# Patient Record
Sex: Male | Born: 1955 | Race: White | Hispanic: No | Marital: Single | Smoking: Never smoker
Health system: Southern US, Community
[De-identification: ages and names within clinical notes are randomized; demographics above are authoritative.]

## PROBLEM LIST (undated history)

## (undated) DIAGNOSIS — K279 Peptic ulcer, site unspecified, unspecified as acute or chronic, without hemorrhage or perforation: Secondary | ICD-10-CM

## (undated) DIAGNOSIS — F1010 Alcohol abuse, uncomplicated: Secondary | ICD-10-CM

## (undated) DIAGNOSIS — I5020 Unspecified systolic (congestive) heart failure: Secondary | ICD-10-CM

## (undated) DIAGNOSIS — D509 Iron deficiency anemia, unspecified: Secondary | ICD-10-CM

## (undated) DIAGNOSIS — K449 Diaphragmatic hernia without obstruction or gangrene: Secondary | ICD-10-CM

## (undated) DIAGNOSIS — K922 Gastrointestinal hemorrhage, unspecified: Secondary | ICD-10-CM

## (undated) DIAGNOSIS — N183 Chronic kidney disease, stage 3 unspecified: Secondary | ICD-10-CM

## (undated) DIAGNOSIS — K2970 Gastritis, unspecified, without bleeding: Secondary | ICD-10-CM

## (undated) DIAGNOSIS — I420 Dilated cardiomyopathy: Secondary | ICD-10-CM

## (undated) HISTORY — PX: SHOULDER SURGERY: SHX246

## (undated) HISTORY — PX: ELBOW BURSA SURGERY: SHX615

## (undated) HISTORY — DX: Dilated cardiomyopathy: I42.0

## (undated) HISTORY — DX: Unspecified systolic (congestive) heart failure: I50.20

---

## 2004-09-14 ENCOUNTER — Emergency Department: Payer: Self-pay | Admitting: Emergency Medicine

## 2005-12-10 ENCOUNTER — Emergency Department: Payer: Self-pay | Admitting: Emergency Medicine

## 2006-01-05 ENCOUNTER — Emergency Department: Payer: Self-pay | Admitting: Emergency Medicine

## 2010-08-05 ENCOUNTER — Emergency Department: Payer: Self-pay | Admitting: Unknown Physician Specialty

## 2010-09-25 ENCOUNTER — Emergency Department: Payer: Self-pay | Admitting: Emergency Medicine

## 2010-11-20 ENCOUNTER — Emergency Department: Payer: Self-pay | Admitting: Emergency Medicine

## 2011-12-27 ENCOUNTER — Emergency Department: Payer: Self-pay | Admitting: Emergency Medicine

## 2012-04-28 ENCOUNTER — Inpatient Hospital Stay: Payer: Self-pay | Admitting: Internal Medicine

## 2012-04-28 LAB — DRUG SCREEN, URINE
Amphetamines, Ur Screen: NEGATIVE (ref ?–1000)
Barbiturates, Ur Screen: NEGATIVE (ref ?–200)
Benzodiazepine, Ur Scrn: NEGATIVE (ref ?–200)
Cocaine Metabolite,Ur ~~LOC~~: NEGATIVE (ref ?–300)
MDMA (Ecstasy)Ur Screen: NEGATIVE (ref ?–500)
Methadone, Ur Screen: NEGATIVE (ref ?–300)
Opiate, Ur Screen: NEGATIVE (ref ?–300)
Phencyclidine (PCP) Ur S: NEGATIVE (ref ?–25)
Tricyclic, Ur Screen: NEGATIVE (ref ?–1000)

## 2012-04-28 LAB — URINALYSIS, COMPLETE
Bacteria: NONE SEEN
Bilirubin,UR: NEGATIVE
Glucose,UR: NEGATIVE mg/dL (ref 0–75)
Nitrite: NEGATIVE
Ph: 5 (ref 4.5–8.0)
RBC,UR: 2 /HPF (ref 0–5)
Squamous Epithelial: <1
WBC UR: 11 /HPF (ref 0–5)

## 2012-04-28 LAB — COMPREHENSIVE METABOLIC PANEL
Albumin: 3.4 g/dL (ref 3.4–5.0)
Alkaline Phosphatase: 88 U/L (ref 50–136)
BUN: 45 mg/dL — ABNORMAL HIGH (ref 7–18)
Calcium, Total: 10.1 mg/dL (ref 8.5–10.1)
Chloride: 99 mmol/L (ref 98–107)
Co2: 24 mmol/L (ref 21–32)
Creatinine: 1.84 mg/dL — ABNORMAL HIGH (ref 0.60–1.30)
Osmolality: 286 (ref 275–301)
SGOT(AST): 57 U/L — ABNORMAL HIGH (ref 15–37)
SGPT (ALT): 34 U/L
Sodium: 137 mmol/L (ref 136–145)
Total Protein: 9.6 g/dL — ABNORMAL HIGH (ref 6.4–8.2)

## 2012-04-28 LAB — CK: CK, Total: 1172 U/L — ABNORMAL HIGH (ref 35–232)

## 2012-04-28 LAB — ETHANOL
Ethanol %: <0.003 % (ref 0.000–0.080)
Ethanol: <3 mg/dL

## 2012-04-28 LAB — CBC
HCT: 39.8 % — ABNORMAL LOW (ref 40.0–52.0)
HGB: 13.3 g/dL (ref 13.0–18.0)
MCV: 95 fL (ref 80–100)
RBC: 4.19 10*6/uL — ABNORMAL LOW (ref 4.40–5.90)
RDW: 13 % (ref 11.5–14.5)
WBC: 12.8 10*3/uL — ABNORMAL HIGH (ref 3.8–10.6)

## 2012-04-28 LAB — MAGNESIUM: Magnesium: 2.5 mg/dL — ABNORMAL HIGH

## 2012-04-29 LAB — BASIC METABOLIC PANEL
Anion Gap: 9 (ref 7–16)
BUN: 36 mg/dL — ABNORMAL HIGH (ref 7–18)
Calcium, Total: 8.8 mg/dL (ref 8.5–10.1)
EGFR (African American): 57 — ABNORMAL LOW
EGFR (Non-African Amer.): 49 — ABNORMAL LOW
Osmolality: 292 (ref 275–301)
Potassium: 3.4 mmol/L — ABNORMAL LOW (ref 3.5–5.1)

## 2012-04-29 LAB — CK: CK, Total: 1127 U/L — ABNORMAL HIGH (ref 35–232)

## 2012-04-29 LAB — CK TOTAL AND CKMB (NOT AT ARMC)
CK, Total: 1061 U/L — ABNORMAL HIGH (ref 35–232)
CK-MB: 14.4 ng/mL — ABNORMAL HIGH (ref 0.5–3.6)

## 2012-04-30 LAB — BASIC METABOLIC PANEL
Anion Gap: 9 (ref 7–16)
Calcium, Total: 8.3 mg/dL — ABNORMAL LOW (ref 8.5–10.1)
Chloride: 106 mmol/L (ref 98–107)
Co2: 24 mmol/L (ref 21–32)
Creatinine: 1.01 mg/dL (ref 0.60–1.30)
EGFR (African American): >60
Glucose: 95 mg/dL (ref 65–99)
Osmolality: 279 (ref 275–301)
Sodium: 139 mmol/L (ref 136–145)

## 2012-04-30 LAB — CK TOTAL AND CKMB (NOT AT ARMC): CK-MB: 8.6 ng/mL — ABNORMAL HIGH (ref 0.5–3.6)

## 2012-05-01 LAB — CK: CK, Total: 462 U/L — ABNORMAL HIGH (ref 35–232)

## 2012-05-01 LAB — BASIC METABOLIC PANEL
BUN: 13 mg/dL (ref 7–18)
Co2: 25 mmol/L (ref 21–32)
Creatinine: 0.92 mg/dL (ref 0.60–1.30)
EGFR (African American): >60
EGFR (Non-African Amer.): >60
Glucose: 119 mg/dL — ABNORMAL HIGH (ref 65–99)
Potassium: 4.3 mmol/L (ref 3.5–5.1)
Sodium: 140 mmol/L (ref 136–145)

## 2012-07-30 ENCOUNTER — Emergency Department: Payer: Self-pay | Admitting: Unknown Physician Specialty

## 2012-09-06 ENCOUNTER — Inpatient Hospital Stay: Payer: Self-pay | Admitting: Internal Medicine

## 2012-09-06 LAB — URIC ACID: Uric Acid: 7.3 mg/dL — ABNORMAL HIGH (ref 3.5–7.2)

## 2012-09-06 LAB — CBC WITH DIFFERENTIAL/PLATELET
Basophil %: 0.6 %
Eosinophil %: 0.6 %
HCT: 39.8 % — ABNORMAL LOW (ref 40.0–52.0)
HGB: 13.9 g/dL (ref 13.0–18.0)
Lymphocyte #: 1.3 10*3/uL (ref 1.0–3.6)
MCV: 89 fL (ref 80–100)
Monocyte #: 0.6 x10 3/mm (ref 0.2–1.0)
Monocyte %: 5.2 %
Neutrophil #: 10.4 10*3/uL — ABNORMAL HIGH (ref 1.4–6.5)

## 2012-09-06 LAB — COMPREHENSIVE METABOLIC PANEL
Albumin: 4.4 g/dL (ref 3.4–5.0)
Alkaline Phosphatase: 88 U/L (ref 50–136)
Anion Gap: 5 — ABNORMAL LOW (ref 7–16)
BUN: 21 mg/dL — ABNORMAL HIGH (ref 7–18)
Bilirubin,Total: 0.3 mg/dL (ref 0.2–1.0)
Creatinine: 1.32 mg/dL — ABNORMAL HIGH (ref 0.60–1.30)
Glucose: 90 mg/dL (ref 65–99)
Osmolality: 278 (ref 275–301)
Potassium: 4.1 mmol/L (ref 3.5–5.1)
Sodium: 138 mmol/L (ref 136–145)
Total Protein: 9 g/dL — ABNORMAL HIGH (ref 6.4–8.2)

## 2012-09-07 LAB — BASIC METABOLIC PANEL
Anion Gap: 6 — ABNORMAL LOW (ref 7–16)
BUN: 17 mg/dL (ref 7–18)
Chloride: 107 mmol/L (ref 98–107)
Co2: 24 mmol/L (ref 21–32)
Creatinine: 1.29 mg/dL (ref 0.60–1.30)
EGFR (African American): >60
Osmolality: 275 (ref 275–301)

## 2012-09-07 LAB — CBC WITH DIFFERENTIAL/PLATELET
Basophil %: 0.2 %
Eosinophil #: 0.2 10*3/uL (ref 0.0–0.7)
Eosinophil %: 1.8 %
HGB: 11.9 g/dL — ABNORMAL LOW (ref 13.0–18.0)
Lymphocyte %: 14.6 %
MCH: 31.9 pg (ref 26.0–34.0)
MCHC: 35.7 g/dL (ref 32.0–36.0)
Monocyte #: 0.6 x10 3/mm (ref 0.2–1.0)
Monocyte %: 6.7 %
Neutrophil %: 76.7 %
Platelet: 159 10*3/uL (ref 150–440)
RBC: 3.74 10*6/uL — ABNORMAL LOW (ref 4.40–5.90)
RDW: 14.1 % (ref 11.5–14.5)
WBC: 9.4 10*3/uL (ref 3.8–10.6)

## 2012-09-08 LAB — CBC WITH DIFFERENTIAL/PLATELET
Basophil %: 0.2 %
Eosinophil #: 0.1 10*3/uL (ref 0.0–0.7)
Eosinophil %: 1.3 %
HCT: 35.3 % — ABNORMAL LOW (ref 40.0–52.0)
HGB: 12.8 g/dL — ABNORMAL LOW (ref 13.0–18.0)
Lymphocyte #: 1.3 10*3/uL (ref 1.0–3.6)
MCH: 32.3 pg (ref 26.0–34.0)
MCHC: 36.2 g/dL — ABNORMAL HIGH (ref 32.0–36.0)
MCV: 89 fL (ref 80–100)
Monocyte #: 0.7 x10 3/mm (ref 0.2–1.0)
Neutrophil #: 8.3 10*3/uL — ABNORMAL HIGH (ref 1.4–6.5)
Neutrophil %: 79.9 %
RBC: 3.96 10*6/uL — ABNORMAL LOW (ref 4.40–5.90)
RDW: 14.3 % (ref 11.5–14.5)

## 2012-09-09 ENCOUNTER — Ambulatory Visit: Payer: Self-pay | Admitting: Orthopedic Surgery

## 2012-09-09 LAB — CBC WITH DIFFERENTIAL/PLATELET
Basophil #: 0 10*3/uL (ref 0.0–0.1)
Basophil %: 0.1 %
Eosinophil #: 0 10*3/uL (ref 0.0–0.7)
HCT: 34.1 % — ABNORMAL LOW (ref 40.0–52.0)
Lymphocyte #: 0.9 10*3/uL — ABNORMAL LOW (ref 1.0–3.6)
MCH: 31 pg (ref 26.0–34.0)
MCHC: 35.4 g/dL (ref 32.0–36.0)
MCV: 88 fL (ref 80–100)
Monocyte #: 0.9 x10 3/mm (ref 0.2–1.0)
Monocyte %: 6.4 %
Neutrophil #: 11.6 10*3/uL — ABNORMAL HIGH (ref 1.4–6.5)
Platelet: 158 10*3/uL (ref 150–440)
RDW: 14.5 % (ref 11.5–14.5)
WBC: 13.5 10*3/uL — ABNORMAL HIGH (ref 3.8–10.6)

## 2012-09-09 LAB — SYNOVIAL CELL COUNT + DIFF, W/ CRYSTALS
Basophil: 0 %
Other Mononuclear Cells: 0 %

## 2012-09-10 LAB — CBC WITH DIFFERENTIAL/PLATELET
Basophil #: 0 10*3/uL (ref 0.0–0.1)
Basophil %: 0.3 %
Eosinophil #: 0 10*3/uL (ref 0.0–0.7)
HCT: 30.4 % — ABNORMAL LOW (ref 40.0–52.0)
HGB: 10.8 g/dL — ABNORMAL LOW (ref 13.0–18.0)
Lymphocyte #: 0.7 10*3/uL — ABNORMAL LOW (ref 1.0–3.6)
Lymphocyte %: 4.2 %
MCHC: 35.4 g/dL (ref 32.0–36.0)
MCV: 89 fL (ref 80–100)
Neutrophil #: 14.9 10*3/uL — ABNORMAL HIGH (ref 1.4–6.5)
Platelet: 144 10*3/uL — ABNORMAL LOW (ref 150–440)
RDW: 14.1 % (ref 11.5–14.5)

## 2012-09-10 LAB — VANCOMYCIN, TROUGH
Vancomycin, Trough: 22 ug/mL (ref 10–20)
Vancomycin, Trough: 15 ug/mL (ref 10–20)

## 2012-09-11 LAB — CBC WITH DIFFERENTIAL/PLATELET
Basophil #: 0 10*3/uL (ref 0.0–0.1)
Basophil %: 0.3 %
Eosinophil #: 0.1 10*3/uL (ref 0.0–0.7)
HCT: 31.9 % — ABNORMAL LOW (ref 40.0–52.0)
HGB: 11 g/dL — ABNORMAL LOW (ref 13.0–18.0)
MCV: 89 fL (ref 80–100)
Monocyte #: 0.9 x10 3/mm (ref 0.2–1.0)
Neutrophil #: 11.3 10*3/uL — ABNORMAL HIGH (ref 1.4–6.5)
Neutrophil %: 85.7 %
RBC: 3.58 10*6/uL — ABNORMAL LOW (ref 4.40–5.90)
RDW: 14.5 % (ref 11.5–14.5)
WBC: 13.2 10*3/uL — ABNORMAL HIGH (ref 3.8–10.6)

## 2012-09-12 LAB — CULTURE, BLOOD (SINGLE)

## 2012-09-13 LAB — WOUND CULTURE

## 2012-09-30 LAB — CULTURE, FUNGUS WITHOUT SMEAR

## 2015-01-30 NOTE — Consult Note (Signed)
PATIENT NAME:  John Escobar, John Escobar MR#:  [id number] DATE OF BIRTH:  1956-06-07  DATE OF CONSULTATION:  09/11/2012  REFERRING PHYSICIAN:  Cherre Huger, MD   CONSULTING PHYSICIAN:  Cheral Marker. Ola Spurr, MD  REASON FOR CONSULTATION: Evaluation of left elbow and wrist septic arthritis versus gout.   HISTORY OF PRESENT ILLNESS: This is a pleasant 59 year old gentleman with a history of hypertension and recurrent arthritis who was admitted on November 25th with complaints of several weeks of increasing left elbow pain. He had had no recent abscesses, infections, injuries or abrasions in the area or elsewhere. He says he has been relatively well over the last few weeks except for the elbow pain. He had been taking pain medications for relief. At the time of admission, he was noted to have a swollen erythematous elbow and was started on IV antibiotics. Orthopedics evaluated him and recommended MRI of the elbow. While on IV antibiotics, the patient then developed left wrist pain. He was taken to the Operating Room by Orthopedics on  November 28th and had washout of his left wrist and elbow. Some minimal purulence was found in some fluid collections but no overwhelming evidence of infection. Gouty crystals were found on the fluid in the wrist.   Currently, the patient says he still has left arm and wrist pain. His right elbow is also starting to act up. He does not feel feverish, short of breath  or have chest pain or other spots that are flaring. He did have a temperature of 103 postoperative yesterday.   PAST MEDICAL HISTORY:  1. Hypertension, not currently on medication.  2. Arthritis. The patient says he has had multiple joints involved including his toes and his knees as well as his hands. He said he has been checked for gout by having his uric acid drawn and told it was negative. He has never had aspiration or injection of his joints.   SOCIAL HISTORY: The patient is divorced. He lives in a homeless  shelter. He is currently unemployed.   FAMILY HISTORY: Noncontributory.   REVIEW OF SYSTEMS: Eleven systems are reviewed and negative except as per history of present illness.  CURRENT MEDICATIONS: The patient is on Vancomycin and Zosyn. He is on amlodipine for blood pressure as well as clonidine. He is on morphine for pain.   ALLERGIES: The patient is aware of no known drug allergies.   PHYSICAL EXAMINATION:  The patient's T-max was 103 on November 29th. Currently he is 98.4, pulse 92, blood pressure 113/70, pulse oximetry 94%, saturating well on room air.   GENERAL: He is pleasant, interactive, wearing dark glasses in the room. He has a slightly unusual affect.   HEENT: Pupils are equal, round, reactive to light and accommodation. Extraocular movements are intact. Sclerae are anicteric. Oropharynx is clear.   NECK: Supple.   HEART: Regular.   LUNGS: Clear to auscultation bilaterally.   ABDOMEN: Soft, nontender, nondistended. No hepatosplenomegaly.   EXTREMITIES: His left elbow and wrist are both wrapped. There is some edema and limited range of motion. His right elbow has some effusion and tenderness and limited range of motion.   LABORATORY, DIAGNOSTIC AND RADIOLOGICAL DATA: White blood count on admission 12.5 with 10.4 thousand neutrophils. His white blood count increased to a max of 16.7 on November 29th and is now down to 13.2. Hemoglobin is 11.8, platelets 155. Comprehensive panel done on November 26th revealed a BUN of 17, creatinine 1.29, otherwise normal. Uric acid was elevated at 7.3. He had  normal LFTs except an elevated total protein at 9.0. Fluid drawn on the 28th from the left wrist was unable to have a wound fluid count done on it for some reason. However, there were monosodium urate crystals noted. Cultures from the wrists and the elbow are all negative. Blood cultures from November 25th are negative x2. CRP on the day of admission was 8.7. ESR was 27. MRI of the left elbow  shows improvement in findings of the elbow from previous study of November 25th. MRI of the left wrist showed a loculated fluid collection or abscess deep to the second extensor compartment and evidence of possible septic arthritis.   IMPRESSION: A 59 year old homeless gentleman with history of hypertension as well as polyarthritis of unclear etiology, admitted with left elbow swelling and pain and then developed left wrist swelling and pain. Cultures from his surgery are negative; however, he does have monosodium urate crystals. He, however, was on antibiotics so cultures are not very reliable. He does have a history of what really sounds like gout although he has never been diagnosed with it.   I think this is mostly a gouty flare even with the fevers and the elevated white count. His right elbow is now involved, and he has swelling and limited range of motion there. There is probably an element of infection although we cannot rule this in or out. I do not think he has a serious septic arthritis infection. It is more likely a deep tissue infection.   RECOMMENDATIONS:  1. I would start treatment for gout with indomethacin and colchicine.   2. Continue vancomycin and Zosyn until discharge. At that time, we would switch him to antibiotics orally. I would recommend doxycycline 100 twice a day and ciprofloxacin 500 twice a day for a 14-day course.  3. The patient will need outpatient followup for presumed gout and to ensure that his symptoms do not recur.   Thank you very much for the consult. I will be glad to follow along with you.  ____________________________ Cheral Marker. Ola Spurr, MD dpf:cbb D: 09/11/2012 12:19:35 ET T: 09/11/2012 13:02:37 ET JOB#: [id number]  cc: Cheral Marker. Ola Spurr, MD, <Dictator> DAVID Ola Spurr MD ELECTRONICALLY SIGNED 09/14/2012 21:45

## 2015-01-30 NOTE — Discharge Summary (Signed)
PATIENT NAME:  John Escobar, John Escobar MR#:  [id number] DATE OF BIRTH:  07/10/1956  DATE OF ADMISSION:  09/06/2012 DATE OF DISCHARGE:  09/13/2012  DISCHARGE DIAGNOSES: 1. Left elbow and wrist cellulitis. 2. Olecranon bursitis. 3. Localized abscess of left anconeus muscle. 4. 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  5. Hypertension.   DISPOSITION: The patient is being discharged home.   DISCHARGE FOLLOWUP: Follow up with the [facility] and Dr. Murlean HarkShalini Ramasunder, [facility], within one week of discharge.   DIET: Low sodium.   ACTIVITY: As tolerated.  DRESSING CARE: Apply dry dressing and change daily.   DISCHARGE MEDICATIONS:  1. Indomethacin 50 mg three times daily, stop after two days.  2. Vicodin 5/325 mg one tablet every 4 hours p.r.n.  3. Colchicine 0.6 mg once a day. 4. Lopressor 25 mg twice a day. 5. Cipro 500 mg twice a day for 14 days.  6. Doxycycline 100 mg twice a day for 14 days.   CONSULTANTS:  1. Clydie Braunavid Fitzgerald, MD - Infectious Disease. 2. Murlean HarkShalini Ramasunder, MD - Orthopedics. 3. Myra Rudehristopher Smith, MD - Orthopedics.  RESULTS: Left elbow x-ray: Diffuse soft tissue swelling, olecranon spur. No evidence of fracture.   MRI of left elbow: Fluid collection with peripheral enhancement over the olecranon process most consistent with bursitis, reactive versus infectious. Surrounding soft tissue edema with enhancement concerning for cellulitis. Abnormal edema within anconeus muscle with enhancement which may be reactive, myositis versus infective myositis or tiny abscess. No significant joint effusion to suggest septic arthritis.   MRI of the left forearm and hand: Possible septic arthritis, loculated fluid collection concerning for abscess of phlegmon.  MRI of forearm: No evidence of any fluid tracking along the fascial planes, in the muscular or soft tissue.  CRP was elevated.   Blood cultures have shown no growth so far.    Wound culture from the wrist and elbow have shown no growth so far.  Fluid analysis from the wrist showed presence of monosodium urate crystals.   White count 12.5 to 16.7, hemoglobin 13.9 to 11, and normal platelet count. Creatinine 1.2 on admission and normal by the time of discharge. Uric acid 7.3, elevated.   HOSPITAL COURSE: The patient is a 59 year old male who presented with left elbow swelling. Initial x-ray showed evidence of cellulitis. He underwent a MRI which showed possible olecranon bursitis and abscess and infection of the left anconeus muscle. The patient was empirically treated with antibiotics. He subsequently also developed swelling in his left wrist and there was concern for septic arthritis on a MRI that was done subsequently. An orthopedic consultation was obtained and the patient underwent incision and drainage of the left wrist and the left elbow. Blood and wound cultures have showed no growth so far. Fluid analysis of the synovial fluid from the left wrist showed evidence of monosodium urate crystals. His uric acid was also elevated. On further questioning, the patient reported that he in the past had swelling in other joints including those of his foot. An Infectious Disease consultation was obtained with Dr. Sampson GoonFitzgerald while in the hospital. The patient was treated with vancomycin and Zosyn. Dr. Sampson GoonFitzgerald recommended switching the patient to Cipro and doxycycline for completion of outpatient treatment. He was also started on treatment for gout with indomethacin and colchicine with good control of his symptoms. The patient had a history of hypertension, but was not on any medications. His blood pressure was high, therefore, he had to be started on medications  while in the hospital. The left elbow and left wrist wounds are doing well. They are sutured with no evidence of drainage or redness/infection. The patient needs to follow-up with [facility] orthopedic physician, Dr.  Murlean Hark, within one week for reevaluation and removal of sutures. The patient was afebrile and hemodynamically stable by the time of discharge.   TIME SPENT: 45 minutes. ____________________________ Darrick Meigs, MD sp:slb D: 09/13/2012 14:30:22 ET T: 09/13/2012 15:02:11 ET JOB#: [id number]  cc: Darrick Meigs, MD, <Dictator> [facility] Darrick Meigs MD ELECTRONICALLY SIGNED 09/14/2012 12:11

## 2015-01-30 NOTE — Consult Note (Signed)
Brief Consult Note: Diagnosis: Left elbow pain.   Comments: Began on weekend after he switched from a walker to a cane.  Presents to ED for worsening.  Denies fever.  Denies injury that he recalls.  Notes history of severe joint pain both knee, left shoulder and multiple feet stress fractures.  States that he has been admitted to hospital for pain in other joints.  Notes redness of elbow that began yesterday.  PE: Soft tissue swelling lateral elbow and some bogginess in olecranon bursa.  Mild erythema about elbow.  ROM passive 30-90.  Exquisite TTP over lateral epicondyle.  A/P: Left elbow pain, mild erythema, mildly elevated WBC and ESR.  CRP pending.  MRI to rule out septic arthritis. Cannot aspirate through cellulitic area.  No effusion seen on radiographs - confirmed with radiology.   Will follow MRI and plan accordingly - process in lateral epicondyle?  Antibiotcs started in ED.  Electronic Signatures: Dawayne Patricia (MD)  (Signed [phone] 17:04)  Authored: Brief Consult Note   Last Updated: 25-Nov-13 17:04 by Dawayne Patricia (MD)

## 2015-01-30 NOTE — Consult Note (Signed)
Brief Consult Note: Diagnosis: Gouty arthritis with probable deep tissue infection but doubt septic arthritis.   Patient was seen by consultant.   Consult note dictated.   Recommend further assessment or treatment.   Orders entered.   Discussed with Attending MD.   Comments: Rec  start gout treatment - dc allopurinol in setting of acute flare - cont vanco and zosyn  - if cxs neg then at dc rec doxy 100 bid and cipro 500 bid for 14 day course will follow - thank you for the consult.  Electronic Signatures: Dierdre HarnessFitzgerald, Shawnette Augello Patrick (MD)  (Signed [phone]-Nov-13 12:22)  Authored: Brief Consult Note   Last Updated: 30-Nov-13 12:22 by Dierdre HarnessFitzgerald, Michael Walrath Patrick (MD)

## 2015-01-30 NOTE — Discharge Summary (Signed)
PATIENT NAME:  John Escobar MR#:  [id number] DATE OF BIRTH:  02/05/1956  DATE OF ADMISSION:  04/28/2012 DATE OF DISCHARGE:  05/01/2012  PRIMARY CARE PHPilar JarvisYSICIAN: No CONSULTING PHYSICIAN: Dr. Hyacinth MeekerMiller, orthopedic surgery.   DISCHARGE DIAGNOSES:  1. Acute renal failure.  2. Rhabdomyolysis. 3. Osteoarthritis.   PROCEDURES: None.   CONDITION: Stable.   CODE STATUS: FULL CODE.    HOME MEDICATIONS:  1. Prednisone 40 mg p.o. daily, then taper.  2. Tramadol 50 mg p.o. every six hours p.r.n. for pain.   DIET: Regular diet.   ACTIVITY: As tolerated.   FOLLOW-UP CARE: Follow up PCP within one week. Follow up with Dr. Hyacinth MeekerMiller within one week   HISTORY OF PRESENT ILLNESS: Patient is a 59 year old Caucasian male with history of osteoarthritis was found by policeman in the streets lying down, unable to ambulate, dehydrated and did not eat for several days, complaining of a flare-up of her arthritis. For detailed history and physical examination, please refer to the admission note dictated by Dr. Rudene Escobar on admission.   LABORATORY, DIAGNOSTIC, AND RADIOLOGICAL DATA: Glucose 118, BUN 45, creatinine 1.8, sodium 137, potassium 3.9, magnesium 2.5. Alcohol level 0.003. CK 1172. Urine drug screen was negative. White count 12,000, hemoglobin 13.  HOSPITAL COURSE: Patient was admitted for acute renal failure likely secondary to a combination of prerenal azotemia from dehydration and rhabdomyolysis. After admission patient has been treated with IV fluid support. Renal function improved to normal range. In addition rhabdomyolysis has been improving. CK decreased to about 450. Patient complains of bilateral knee pain possibly due to osteoarthritis. X-ray show also osteoarthritis. No fracture or dislocation. Dr. Hyacinth MeekerMiller evaluated patient, recommended follow up as outpatient. [facility]. He also suggested taken prednisone then taper. Patient is clinically stable. He will be  discharged to a homeless shelter today. Discussed the patient's discharge plan with the patient and nurse and the case manager.   TIME SPENT: About 35 minutes.  ____________________________ Shaune PollackQing Naelle Diegel, MD qc:cms D: 05/01/2012 14:51:41 ET T: 05/03/2012 11:01:41 ET JOB#: [id number]  cc: Shaune PollackQing Kysha Muralles, MD, <Dictator>  Shaune PollackQING Stevens Magwood MD ELECTRONICALLY SIGNED 05/03/2012 16:12

## 2015-01-30 NOTE — Consult Note (Signed)
Brief Consult Note: Diagnosis: Bilateral knee arthritis.   Patient was seen by consultant.   Recommend further assessment or treatment.   Comments: 59 year old homeless male was admitted for renal failure after being found on street by police.  Has had history of knee arthritis and was last seen by his rheumatologist, Dr  Kathi LudwigSyed, in May.  complains of bilateral knee pain but is better with prednisone po. Being discharged to homeless shelter today.   Exam:  alert and comfortable.  circulation/sensation/motor function good both knees.  slight effusion both knees.  no reddness or warmth.  range of motion 0-95* bilaterally with mild pain.  stable to stress.  skin intact.  ligaments stable.    X-rays:  bilateral medial joint line arthritis, right > left.    Imp:  Bilateral knee arthritis  Rx:  continue prednisone taper        follow-up with Dr Kathi LudwigSyed this week.        He is not a suitable surgical candidate in his current living situation.  [facility] nee  [facility]..  Electronic Signatures: Valinda HoarMiller, Nechemia Chiappetta E (MD)  (Signed 20-Jul-13 12:52)  Authored: Brief Consult Note   Last Updated: 20-Jul-13 12:52 by Valinda HoarMiller, Solash Tullo E (MD)

## 2015-01-30 NOTE — Consult Note (Signed)
PATIENT NAME:  John Escobar, John Escobar MR#:  [id number] DATE OF BIRTH:  1956/01/12  DATE OF CONSULTATION:  09/06/2012  REFERRING PHYSICIAN:   CONSULTING PHYSICIAN:  Murlean HarkShalini Cora Stetson, MD  A brief consult note was typed at the time of consultation.   CHIEF COMPLAINT: Left elbow pain and swelling.   HISTORY: Mr. John Escobar is a 59 year old gentleman who presented to the Emergency Room with complaints of swelling and pain in the left elbow. He states that he had had no injury. He does note that over the past one week, just prior to the onset of pain, he switched from using a walker to using a cane held in the left hand. He states that he took 200 mg of ibuprofen with no pain relief.   PHYSICAL EXAMINATION: On presentation to the emergency room, he had erythema surrounding the elbow and significant pain. Radiographs and an MRI were obtained. On physical Examination, the left elbow was examined. Over the posterior aspect of the elbow. There is some erythema of moderate intensity. There is significant soft tissue swelling and bogginess of the olecranon bursa. The olecranon bursa is moderately tender to palpation. The patient has no tenderness to palpation over the medial epicondyle or in the ulnar groove. There is no tenderness to palpation posterior to the lateral epicondyle. The patient is exquisitely tender to palpation over the lateral epicondyle. He has pain with forced volar flexion of the wrist and with resisted dorsiflexion of the wrist. He has no palpable epitrochlear lymphadenopathy. Range of motion of the elbow is from 30 to 90 degrees passively. Imaging studies on, AP, lateral, and two oblique views of the left elbow were obtained. They demonstrate significant soft tissue swelling. They is no obvious effusion seen on the radiographs. MRI was ordered and results are pending.   ASSESSMENT/RECOMMENDATIONS: Left elbow pain with mild to moderate erythema. Septic arthritis is unlikely based on radiographs,  however, we will confirm this with MRI. The patient will be admitted to the medical service for IV antibiotic treatment. If MRI demonstrates prominent effusion in the joint consistent with possible septic arthritis, we will proceed with operative irrigation and debridement. I do not suspect this will be the case.      The patient should be placed in a sling. He should have ice applied to his elbow for a minimum of 20 minutes, three to four times per day. Physical therapy should be consulted to proceed with gentle range of motion.   We will follow up after the MRI has been completed.  ____________________________ Murlean HarkShalini Mersadez Linden, MD sr:slb D: 09/07/2012 14:35:00 ET T: 09/07/2012 15:21:18 ET JOB#: [id number]  cc: Murlean HarkShalini Dagmar Adcox, MD, <Dictator> Murlean HarkSHALINI Arnaldo Heffron MD ELECTRONICALLY SIGNED 09/29/2012 10:23

## 2015-01-30 NOTE — H&P (Signed)
PATIENT NAME:  John Escobar, John Escobar MR#:  [id number] DATE OF BIRTH:  [birth year]  DATE OF ADMISSION:  04/29/2012  PRIMARY CARE PHYSICIAN: He does not recall a name, but last time he went to [facility].   CHIEF COMPLAINT:  He was found by the police in the street, lying down, unable to ambulate, dehydrated, and had not eaten for several days. He is also complaining of a flare-up of  arthritis.   HISTORY OF PRESENT ILLNESS: Mr. Shipper is a 59 year old Caucasian male who is right now homeless. He states that the last meal he had was Thursday, that is about a week ago. He had some doughnuts. Since that time he hydrated himself with some Gatorade but he ran out of that as well. He got weaker with time and he had a flare-up of arthritis. The patient is known to have osteoarthritis. The joint pain started with both feet, then both knees, and now even his wrists on both sides are painful without actual swelling. This has been ongoing for about a week. The patient tried to sleep in the street and he was found by the police.  They tried to get him off the ground but could not, but with the help of three people they were able to get him to the car and he was transported to the Emergency Department. Evaluation here was consistent with rhabdomyolysis, acute renal failure, and dehydration. The patient was admitted for further management.   REVIEW OF SYSTEMS: CONSTITUTIONAL: Denies having any fever. No chills, but he has fatigue. EYES: No blurring of vision. No double vision. ENT: No hearing impairment. No sore throat. No dysphagia. CARDIOVASCULAR: No chest pain. No shortness of breath. No edema.  RESPIRATORY: No cough. No shortness of breath. No chest pain. GI: No abdominal pain, no vomiting, no diarrhea. GU: Denies any dysuria. No frequency of urination. MUSCULOSKELETAL: He has joint pains all over without swelling. No muscular pain or swelling other than generalized weakness. INTEGUMENT: No skin rash. No  ulcers. NEURO: No focal weakness. No seizure activity. No headache. PSYCH: No anxiety. No depression.  ENDOCRINE: No polyuria or polydipsia. No heat or cold intolerance.   PAST MEDICAL HISTORY:  Osteoarthritis, in particular both of his knees. Last time he was seen by [facility] he was referred to a rheumatologist in their group.  That was on 05/09, two months ago.  He was told that he has generalized osteoarthritis.   PAST SURGICAL HISTORY:  1. Bilateral knee joint arthroscopic surgeries. He was told that the cartilage of the left knee is completely gone.  2. He also had left shoulder surgery for bursitis.   FAMILY HISTORY: His father is alive at the age of 38. He had several heart attacks. His mother is still alive. She is 68, suffers from dementia and resides at a nursing home.   SOCIAL HISTORY: He is married, recently separated from his wife. He is now homeless. 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   SOCIAL HABITS: Nonsmoker. No history of alcohol or drug abuse.   ADMISSION MEDICATIONS: None.   ALLERGIES: No known drug allergies.   PHYSICAL EXAMINATION:  VITAL SIGNS: Blood pressure 110/74, respiratory rate 20, pulse 125 and regular, temperature 97.9, oxygen saturation 97%.   GENERAL APPEARANCE: Middle-aged male laying in bed in no acute distress.   HEAD: No pallor. No icterus. No cyanosis.   ENT: Hearing was normal. Nasal mucosa, lips, tongue were normal.   EYES: Normal  eyelids and conjunctivae, pupils about 4 mm and sluggishly reactive to light.   NECK: Supple. Trachea at midline. No thyromegaly. No cervical lymphadenopathy. No masses.   HEART: Normal S1, S2. No S3, S4. No murmur. No gallop. No carotid bruits.   RESPIRATORY: Normal breathing pattern without use of accessory muscles. No rales. No wheezing.   ABDOMEN: Soft without tenderness. No hepatosplenomegaly. No masses. No hernias.   SKIN: No ulcers. No subcutaneous nodules.    MUSCULOSKELETAL: No joint swelling. No clubbing. No effusions.   NEUROLOGIC: Cranial nerves II through XII are intact. No focal motor deficit.   PSYCHIATRY: The patient is alert and oriented times three. Mood and affect were normal.   LABORATORY, DIAGNOSTIC, AND RADIOLOGICAL DATA: Serum glucose 118, BUN 45, creatinine 1.8, sodium 137, potassium 3.9, magnesium 2.5. Ethanol was less than 0.003. Total protein 9.6. Albumin 3.4, bilirubin 1.2, AST 57, ALT 34. Total CPK 1172. Urine drug screen was negative Urinalysis showed hazy urine, 11 white blood cells. CBC showed white count of 12,000, hemoglobin 13, hematocrit 39, platelet count 249.   ASSESSMENT:  1. Acute renal failure: Likely secondary to a combination of  prerenal azotemia from dehydration along with rhabdomyolysis. 2. Rhabdomyolysis: Secondary to inactivity and lying down on the ground for quite some time.  3. Dehydration.  4. Mild leukocytosis: This is nonspecific. 5. Pyuria: Mild elevation of white blood cells in the urine. The patient is asymptomatic. 6. Generalized osteoarthritis.   PLAN:  We will admit the patient to the medical floor. IV hydration with normal saline. Follow up on BUN, creatinine, and electrolytes. Follow up on the total CPK. Pain control using either Tylenol or Ultram. We will avoid nonsteroidal anti-inflammatory medication since he has renal compromise for the time being. I will send urine for culture; since the patient is asymptomatic, I will not treat. However, if the culture grows out significant bacteria then we will treat if it is evident.  TIME SPENT EVALUATING THIS PATIENT: More than 55 minutes.    ____________________________ Carney CornersAmir M. Rudene Rearwish, MD amd:bjt D: 04/28/2012 23:29:53 ET T: 04/29/2012 07:06:58 ET JOB#: [id number]  cc: [facility], [facility] Zollie ScaleAMIR M Shafiq Larch MD ELECTRONICALLY SIGNED 04/30/2012 4:57

## 2015-01-30 NOTE — Op Note (Signed)
PATIENT NAME:  John Escobar, John Escobar MR#:  [id number] DATE OF BIRTH:  1956-03-20  DATE OF PROCEDURE:  09/09/2012  PREOPERATIVE DIAGNOSIS:  Left wrist septic arthritis, left elbow infected olecranon bursitis with localized abscess of left anconeus muscle.  POSTOPERATIVE DIAGNOSIS: Left wrist septic arthritis, left elbow infected olecranon bursitis with localized abscess of left anconeus muscle.  PROCEDURES:  1. Irrigation and excision of debridement down to and including left wrist joint capsule of left wrist with wound measurements of 8 x 2 cm.     2. Irrigation and excision of debridement down to and including muscle of left elbow with wound measurements of 6 x 2 cm.   SURGEON:  [place] E. Hue Steveson, MD.   ANESTHESIA: General.    FLUIDS: Per anesthesia record.  BLOOD LOSS: Minimal.  COMPLICATIONS: None.  SPECIMENS: Intraoperative culture swabs and tissue samples were obtained and sent to lab for evaluation.  INDICATIONS: This is a 59 year old male who was admitted initially with cellulitis to the left elbow and evidence of left elbow infected olecranon bursitis. He underwent a magnetic resonance imaging which did in fact reveal some bursitis as well as a localized abscess within the anconeus muscle. He was treated with IV antibiotics and responded appropriately initially. However, he did, on the morning of surgery have increasing pain not only within the left elbow but more pronounced within the left wrist. A magnetic resonance imaging of the left upper extremity was completed which revealed fluid along the second dorsal compartment which then tracked down into the region of the wrist joint. It was felt that the patient had failed conservative medical management and was therefore indicated for operative intervention in the form of a formal incision and drainage of both the left wrist and elbow. All risks, benefits and alternatives of the procedure were explained at length to the patient and he wished to  proceed. Informed consent was obtained.   DESCRIPTION OF PROCEDURE:  The patient was seen in the preoperative holding area where the operative site was marked by an operative surgeon. Preoperative antibiotics were administered routinely on the floor. The patient was taken to the Operating Room where he was placed supine on the Operating Room table. All bony prominences were well padded. General anesthesia was gently induced. The left upper extremity was isolated. A tourniquet was placed about the left upper arm. The left upper extremity was then prepped and draped in the usual sterile manner and a surgical time-out was performed. To begin we made an incision at Lister's tubercle at the joint-line and made our way down to the extensor tendons. We retracted the interval between the second toe compartment and made our way to the joint capsule.  There was some edematous fluid superficially which was swabbed and sent to lab for evaluation. Upon entry into the joint itself, we encountered additional fluid with minimal purulence. There was some crystallized material within the joint as well which was collected and sent to lab for evaluation. A portion of the joint capsule was also debrided and excised sharply and sent to lab for evaluation. A small curette was then used to debris the soft tissues of any nonviable tissue. We then opened the sheaths of the second and third compartments which again revealed some edematous fluid tracking along the distal aspect of the compartments but no purulence was noted headed in a proximal direction. At this point, the wrist was copiously irrigated with normal saline using bulb syringe. The capsule was closed with 3-0 Vicryl. We then  turned our attention to the left elbow where an incision was made between the lateral epicondyle and the tip of the olecranon. We dissected medially first making our way over to the olecranon bursa. This was incised revealing a small amount of fluid. A  curette was utilized to debris the bursa and excise all nonviable tissue. We then made our way laterally up towards the muscle belly of the anconeus muscle. There was no purulence or fluid encountered. We did bluntly dissect along this interval and gently debrided the tissues, excising any nonviable tissue in its entirety. At this point, the left elbow was copiously irrigated with normal saline using bulb syringe. The skin was closed with 3-0 Vicryl followed by 4-0 nylon. The margins of the left wrist wound were also closed partially. A wet to dry dressing was placed within the mid portion of the incision. Sterile dressings were applied to both the left wrist and left elbow. Drapes were removed. The patient was awakened from general anesthesia without complication and transported to the Recovery Room in stable condition.    POSTOPERATIVE PLAN: The patient will return to the floor. He will continue with his antibiotics. An ID consult has already been placed and we will await their findings. We will followup on all intraoperative culture results.   ____________________________ [place] E. Genasis Zingale, MD ces:ap D: 09/10/2012 09:16:31 ET                T: 09/10/2012 09:29:14 ET              JOB#: [id number] ItalyHAD E Estanislado Surgeon MD ELECTRONICALLY SIGNED 09/12/2012 17:50

## 2015-01-30 NOTE — H&P (Signed)
PATIENT NAME:  Pilar JarvisRKER, Ashley J MR#:  [id number] DATE OF BIRTH:  December 11, 1955  DATE OF ADMISSION:  09/06/2012  REASON FOR ADMISSION: Painful left elbow, possible cellulitis versus septic arthritis.    HISTORY OF PRESENT ILLNESS: [name] is a nice 59 year old gentleman who has history of osteoarthritis, in the past has been told that he has hypertension but he is not on medications. He used to be a patient of Dr. Ellsworth Lennoxejan-Sie but right now he no longer has a doctor. He goes to the [facility] if necessary.  He was previously admitted here in July due to altered mental status likely due to dehydration, possible heat exhaustion with acute kidney injury or rhabdomyolysis. He has recently separated this year and now is homeless.   The patient comes in today with a history of left elbow pain that has been hurting for the past couple of weeks, starting to get worse in the last couple of days to the point that he decided to come to the ER since it hurts every time that he moves it. He noticed some swelling on the bottom of the elbow which has been soft and liquid-like. He states the pain is very severe; he says it is more than 10/10, but the patient looks very comfortable. After going through the pain scale and showing him the pain scale, he is still smiling and he says it is above 10, but after a while he says it is probably an 8. He states that he has been taking some ibuprofen and some BC powders for it, but it has not really helped. The patient denies any fevers and he was not aware of having any cellulitic process until he came here and he was told that his elbow was real red.   REVIEW OF SYSTEMS: CONSTITUTIONAL: Denies any fever, weight loss, or weight gain.  EYES: Denies any blurry vision, double vision, or inflammation of the eyes. ENT: Denies any tinnitus, difficulty swallowing, or postnasal drip. RESPIRATORY: Denies any cough, wheezing, hemoptysis, or chronic obstructive pulmonary disease. He has never

## 2015-11-07 ENCOUNTER — Emergency Department: Payer: Medicare Other

## 2015-11-07 ENCOUNTER — Emergency Department
Admission: EM | Admit: 2015-11-07 | Discharge: 2015-11-08 | Disposition: A | Discharge status: Other Acute Inpatient Hospital | Payer: Medicare Other | Attending: Emergency Medicine | Admitting: Emergency Medicine

## 2015-11-07 DIAGNOSIS — R600 Localized edema: Secondary | ICD-10-CM | POA: Insufficient documentation

## 2015-11-07 DIAGNOSIS — R52 Pain, unspecified: Secondary | ICD-10-CM

## 2015-11-07 DIAGNOSIS — M25532 Pain in left wrist: Secondary | ICD-10-CM | POA: Diagnosis not present

## 2015-11-07 DIAGNOSIS — M79642 Pain in left hand: Secondary | ICD-10-CM | POA: Insufficient documentation

## 2015-11-07 DIAGNOSIS — R609 Edema, unspecified: Secondary | ICD-10-CM

## 2015-11-07 LAB — CBC WITH DIFFERENTIAL/PLATELET
Basophils Absolute: 0.1 10*3/uL (ref 0–0.1)
Basophils Relative: 1 %
EOS ABS: 0 10*3/uL (ref 0–0.7)
Eosinophils Relative: 0 %
HEMATOCRIT: 41.2 % (ref 40.0–52.0)
HEMOGLOBIN: 13.8 g/dL (ref 13.0–18.0)
LYMPHS ABS: 0.5 10*3/uL — AB (ref 1.0–3.6)
LYMPHS PCT: 4 %
MCH: 33.1 pg (ref 26.0–34.0)
MCHC: 33.5 g/dL (ref 32.0–36.0)
MCV: 98.7 fL (ref 80.0–100.0)
MONOS PCT: 6 %
Monocytes Absolute: 0.7 10*3/uL (ref 0.2–1.0)
NEUTROS ABS: 9.1 10*3/uL — AB (ref 1.4–6.5)
NEUTROS PCT: 89 %
Platelets: 237 10*3/uL (ref 150–440)
RBC: 4.18 MIL/uL — AB (ref 4.40–5.90)
RDW: 12.9 % (ref 11.5–14.5)
WBC: 10.3 10*3/uL (ref 3.8–10.6)

## 2015-11-07 LAB — COMPREHENSIVE METABOLIC PANEL
ALT: 11 U/L — AB (ref 17–63)
AST: 14 U/L — ABNORMAL LOW (ref 15–41)
Albumin: 4.2 g/dL (ref 3.5–5.0)
Alkaline Phosphatase: 72 U/L (ref 38–126)
Anion gap: 13 (ref 5–15)
BUN: 25 mg/dL — ABNORMAL HIGH (ref 6–20)
CHLORIDE: 103 mmol/L (ref 101–111)
CO2: 23 mmol/L (ref 22–32)
Calcium: 10.1 mg/dL (ref 8.9–10.3)
Creatinine, Ser: 1.97 mg/dL — ABNORMAL HIGH (ref 0.61–1.24)
GFR, EST AFRICAN AMERICAN: 41 mL/min — AB (ref >60)
GFR, EST NON AFRICAN AMERICAN: 35 mL/min — AB (ref >60)
Glucose, Bld: 111 mg/dL — ABNORMAL HIGH (ref 65–99)
POTASSIUM: 4.5 mmol/L (ref 3.5–5.1)
SODIUM: 139 mmol/L (ref 135–145)
Total Bilirubin: 0.9 mg/dL (ref 0.3–1.2)
Total Protein: 9.1 g/dL — ABNORMAL HIGH (ref 6.5–8.1)

## 2015-11-07 LAB — URIC ACID: URIC ACID, SERUM: 8.5 mg/dL — AB (ref 4.4–7.6)

## 2015-11-07 MED ORDER — ONDANSETRON HCL 4 MG/2ML IJ SOLN
4.0000 mg | Freq: Once | INTRAMUSCULAR | Status: AC
  Administered 2015-11-07: 4 mg via INTRAVENOUS
  Filled 2015-11-07: qty 2

## 2015-11-07 MED ORDER — IOHEXOL 300 MG/ML  SOLN
80.0000 mL | Freq: Once | INTRAMUSCULAR | Status: AC | PRN
  Administered 2015-11-07: 80 mL via INTRAVENOUS

## 2015-11-07 MED ORDER — MORPHINE SULFATE (PF) 4 MG/ML IV SOLN
4.0000 mg | Freq: Once | INTRAVENOUS | Status: AC
  Administered 2015-11-07: 4 mg via INTRAVENOUS
  Filled 2015-11-07: qty 1

## 2015-11-07 MED ORDER — SODIUM CHLORIDE 0.9 % IV BOLUS (SEPSIS)
1000.0000 mL | Freq: Once | INTRAVENOUS | Status: AC
  Administered 2015-11-07: 1000 mL via INTRAVENOUS

## 2015-11-07 NOTE — ED Provider Notes (Signed)
[facility] Emergency Department Provider Note  ____________________________________________  Time seen: Approximately 9:01 PM  I have reviewed the triage vital signs and the nursing notes.   HISTORY  Chief Complaint Hand Pain    HPI John Escobar is a 60 y.o. male who presents to the emergency department complaining of left hand swelling, pain, increasing redness. Patient states that symptoms began in his wrist and travels down on the fourth and fifth digits side of his hand. Patient states that symptoms began insidiously approximately 5 days ago and has increased over the intervening period. She reports a great increase in symptoms over the last 24 hours. He denies any injury, or wounds prior to this complaint. Patient states that area is extremely painful at this time. Patient has a history of similar incident approximately 3 years ago. Patient was unable to give accurate records on what was involved but states that "they had to cut his wrist and elbow open. Patient denies any fevers or chills, chest pain, shortness of breath, abdominal pain, nausea or vomiting at this time.   Further perusal of patient's medical records reveals patient was admitted in 2013 for an abscess traveling from his elbow along his muscle belly to his wrist. This was treated with Zosyn and vancomycin and then eventually had surgical incision and drainage of same.  History reviewed. No pertinent past medical history.  There are no active problems to display for this patient.   Past Surgical History  Procedure Laterality Date  . Elbow bursa surgery    . Shoulder surgery Left     No current outpatient prescriptions on file.  Allergies Review of patient's allergies indicates no known allergies.  No family history on file.  Social History Social History  Substance Use Topics  . Smoking status: Never Smoker   . Smokeless tobacco: None  . Alcohol Use: Yes     Review of  Systems  Constitutional: No fever/chills Eyes: No visual changes. No discharge ENT: No sore throat. Cardiovascular: no chest pain. Respiratory: no cough. No SOB. Gastrointestinal: No abdominal pain.  No nausea, no vomiting.  No diarrhea.  No constipation. Genitourinary: Negative for dysuria. No hematuria Musculoskeletal: Negative for back pain. Positive for left wrist pain. Skin: Negative for rash. Neurological: Negative for headaches, focal weakness or numbness. 10-point ROS otherwise negative.  ____________________________________________   PHYSICAL EXAM:  VITAL SIGNS: ED Triage Vitals  Enc Vitals Group     BP 11/07/15 1936 154/109 mmHg     Pulse Rate 11/07/15 1936 116     Resp 11/07/15 1936 20     Temp 11/07/15 1936 98.6 F (37 C)     Temp Source 11/07/15 1936 Oral     SpO2 11/07/15 1936 96 %     Weight 11/07/15 1936 130 lb (58.968 kg)     Height 11/07/15 1936  (1.753 m)     Head Cir --      Peak Flow --      Pain Score 11/07/15 1937 10     Pain Loc --      Pain Edu? --      Excl. in GC? --      Constitutional: Alert and oriented. Well appearing and in no acute distress. Eyes: Conjunctivae are normal. PERRL. EOMI. Head: Atraumatic. ENT:      Ears:       Nose: No congestion/rhinnorhea.      Mouth/Throat: Mucous membranes are moist.  Neck: No stridor.   Hematological/Lymphatic/Immunilogical: No  cervical lymphadenopathy. Cardiovascular: Normal rate, regular rhythm. Normal S1 and S2.  Good peripheral circulation. Respiratory: Normal respiratory effort without tachypnea or retractions. Lungs CTAB. Gastrointestinal: Soft and nontender. No distention. No CVA tenderness. Musculoskeletal: No lower extremity tenderness nor edema.  No joint effusions. Gross edema noted to left wrist when compared with right. Area is erythematous and edematous. There is warm to palpation. No firmness or fluctuance noted. This is circumferential around the left wrist extending over the  fourth and fifth metacarpal region both on the palmar and dorsal aspect of the hand. Left digit is dusky in appearance. Capillary refill is greatly reduced, and area is extremely tender to palpation. There appears to be an area of eschar noted to the medial fifth and lateral fourth digits. There is no fluctuance in this area. No pustular drainage noted. Capillary refill is present in the first and fourth digits. Sensation is intact first 4 digits. Sensation is reduced and the fifth digit. There is limited range of motion to all digits and very minimal range of motion to the fifth digit. The compartments are soft to palpation. Neurologic:  Normal speech and language. No gross focal neurologic deficits are appreciated.  Skin:  Skin is warm, dry and intact. No rash noted. Psychiatric: Mood and affect are normal. Speech and behavior are normal. Patient exhibits appropriate insight and judgement.   ____________________________________________   LABS (all labs ordered are listed, but only abnormal results are displayed)  Labs Reviewed  COMPREHENSIVE METABOLIC PANEL - Abnormal; Notable for the following:    Glucose, Bld 111 (*)    BUN 25 (*)    Creatinine, Ser 1.97 (*)    Total Protein 9.1 (*)    AST 14 (*)    ALT 11 (*)    GFR calc non Af Amer 35 (*)    GFR calc Af Amer 41 (*)    All other components within normal limits  CBC WITH DIFFERENTIAL/PLATELET - Abnormal; Notable for the following:    RBC 4.18 (*)    Neutro Abs 9.1 (*)    Lymphs Abs 0.5 (*)    All other components within normal limits  URIC ACID - Abnormal; Notable for the following:    Uric Acid, Serum 8.5 (*)    All other components within normal limits  CULTURE, BLOOD (ROUTINE X 2)  CULTURE, BLOOD (ROUTINE X 2)   ____________________________________________  EKG   ____________________________________________  RADIOLOGY Festus Barren Cuthriell, personally viewed and evaluated these images (plain radiographs) as part of  my medical decision making, as well as reviewing the written report by the radiologist.  Dg Wrist Complete Left  11/07/2015  CLINICAL DATA:  Left hand and wrist redness, swelling and pain. No known injury. Symptoms for 1 week. Initial encounter. EXAM: LEFT WRIST - COMPLETE 3+ VIEW COMPARISON:  Plain films left wrist 11/20/2010. FINDINGS: No acute bony or joint abnormality is identified. Soft tissues appear swollen. Chondrocalcinosis about the wrist is noted. Bones are osteopenic. Mild first CMC degenerative change is seen. IMPRESSION: Soft tissue swelling without underlying acute bony or joint abnormality. Chondrocalcinosis. Mild appearing first CMC osteoarthritis. Electronically Signed   By: Drusilla Kanner M.D.   On: 11/07/2015 20:17   Ct Hand Left W Contrast  11/07/2015  CLINICAL DATA:  Left hand swelling, pain, and redness. Wrist is swelling. No known injury. EXAM: CT OF THE LEFT HAND WITH CONTRAST TECHNIQUE: Multidetector CT imaging was performed following the standard protocol during bolus administration of intravenous contrast. CONTRAST:  80mL  OMNIPAQUE IOHEXOL 300 MG/ML  SOLN COMPARISON:  Left wrist radiographs 11/07/2015. MRI left hand 09/09/2012. FINDINGS: There are prominent degenerative changes in the carpus. Joint space narrowing and sclerosis in the radiocarpal and intercarpal joints throughout. Multiple subcortical cystic changes demonstrated throughout the carpal bones and in the proximal metacarpal bones probably indicating degenerative cysts. Erosive arthritis could also have this appearance. Cystic changes appear to of developed since the previous MRI study. Degenerative changes also demonstrated in the first and third metacarpal phalangeal joints. No evidence of acute fracture or dislocation. Adjacent to the base of the scaphoid bone, there is evidence of prominent soft tissues with vague contrast enhancement. There is no discrete fluid collection to suggest abscess but this might  represent inflammatory or reactive synovial tissue. No loculated fluid collections demonstrated in the soft tissues that would indicate an abscess. Chondrocalcinosis is demonstrated in the radial carpal and ulna carpal regions as well as in some of the intercarpal joints. IMPRESSION: Prominent degenerative changes throughout the left wrist with multiple subcortical cystic changes throughout the carpal bones and proximal metacarpals. Prominent enhancing soft tissue around the scaphoid bone likely representing inflammatory or reactive synovial tissue. No loculated fluid collection to suggest an abscess. Electronically Signed   By: Burman Nieves M.D.   On: 11/07/2015 23:17    ____________________________________________    PROCEDURES  Procedure(s) performed:       Medications  morphine 4 MG/ML injection 4 mg (4 mg Intravenous Given 11/07/15 2143)  ondansetron (ZOFRAN) injection 4 mg (4 mg Intravenous Given 11/07/15 2143)  sodium chloride 0.9 % bolus 1,000 mL (1,000 mLs Intravenous New Bag/Given 11/07/15 2143)  iohexol (OMNIPAQUE) 300 MG/ML solution 80 mL (80 mLs Intravenous Contrast Given 11/07/15 2227)     ____________________________________________   INITIAL IMPRESSION / ASSESSMENT AND PLAN / ED COURSE  Pertinent labs & imaging results that were available during my care of the patient were reviewed by me and considered in my medical decision making (see chart for details).  Patient initially presented to the emergency department complaining of left wrist pain. Symptoms began insidiously 5 days ago but greatly increased over the last 24 hours. Initially area was erythematous and edematous in a circumferential manner around the wrist with erythema and edema traveling down the medial aspect of his hand towards the fourth and fifth digits. Initially the fifth digit was dusky in appearance, edematous, with a great delay in capillary refill. Tactile sensation was decreased by there was severe  tenderness to palpation. There was no known injury or wound prior to these complaints. Initial x-ray revealed no acute abnormality of the exception of some soft tissue edema.   Patient endorsed a similar incident approximately 3 years ago but was unable to give details of the time. Labs and CT scan were undertaken at this time for further evaluation. Perusal of patient's medical records revealed that patient had similar complaints in 2013, was admitted for antibiotics and further evaluation. Initially imaging did not reveal any abscess to this area of his hand. He initially had minimal leukocytosis at that time which greatly increased erythema on antibiotics. A repeat MRI revealed that the patient had a deep abscess around the Anconeus muscle running from his left elbow down to his wrist. That was surgically incised and drained in the OR.   ----------------------------------------- 11:41 PM on 11/07/2015 -----------------------------------------  Results from blood work revealed slightly decreased renal function but do not reveal an elevated white blood cell count. Patient's uric acid level is mildly elevated at  8.5 but this does not explain patient's symptoms. CT reveals degenerative changes throughout the wrist with prominent enhancing of soft tissue around the scaphoid bone representing inflammatory or reactive synovial tissue. No loculated fluid collection suggesting an abscess at this time. At this time patient was to be transferred up to major side of the emergency department dissection was closing. Discussing patient's symptoms, history, physical exam, and results with Dr. York Cerise, it was determined that the patient would be best treated by transfer to [facility] for further evaluation and treatment by hand specialists.  Reevaluation of the patient's hand at this time reveals mild improvement to the dusky appearance of fifth digit. This digit is still erythematous and edematous with decreased capillary  refill, but dusky appearance is improving. Patient still expresses significant tenderness to palpation of left wrist, and left hand. Compartments are still pliable at this time. Patient has good range of motion to all digits at this time which is an improvement. Patient does endorse pain with movement of digits at this time. Patient states that the pain has gone from an aching pressure sensation out to a sharp burning sensation    ----------------------------------------- 12:19 AM on 11/08/2015 -----------------------------------------  At this time patient is to be transferred from emergency department to [facility] emergency Department for further evaluation and treatment. I have talked with both the attending orthopedics surgeon as well as the emergency Department attending and they have agreed to accept patient. Dr. Ranell Patrick is the accepting ED attending. The patient will be transported by [facility]. IV vancomycin is started at 10 mg/kg. Patient will be maintained with pain medication as needed.    ____________________________________________  FINAL CLINICAL IMPRESSION(S) / ED DIAGNOSES  Final diagnoses:  Pain  Edema of hand  Hand pain, left      NEW MEDICATIONS STARTED DURING THIS VISIT:  New Prescriptions   No medications on file        Racheal Patches, PA-C 11/08/15 0023  Loleta Rose, MD 12/03/15 2255

## 2015-11-07 NOTE — ED Notes (Signed)
Presents with left hand swelling, pain and redness. Wrist is also swollen. Denies falling, states that if he fell he doesn't remember it. Patient in from home via EMS.

## 2015-11-08 DIAGNOSIS — M79642 Pain in left hand: Secondary | ICD-10-CM | POA: Diagnosis not present

## 2015-11-08 MED ORDER — HYDROMORPHONE HCL 1 MG/ML IJ SOLN
INTRAMUSCULAR | Status: AC
  Administered 2015-11-08: 1 mg via INTRAVENOUS
  Filled 2015-11-08: qty 1

## 2015-11-08 MED ORDER — HYDROMORPHONE HCL 1 MG/ML IJ SOLN
1.0000 mg | Freq: Once | INTRAMUSCULAR | Status: AC
  Administered 2015-11-08: 1 mg via INTRAVENOUS

## 2015-11-08 MED ORDER — VANCOMYCIN HCL 500 MG IV SOLR
500.0000 mg | Freq: Once | INTRAVENOUS | Status: AC
  Administered 2015-11-08: 500 mg via INTRAVENOUS
  Filled 2015-11-08 (×2): qty 500

## 2015-11-08 NOTE — ED Notes (Signed)
Resumed care from jerri rn.  Pt alert.  Iv in place.

## 2015-11-12 LAB — CULTURE, BLOOD (ROUTINE X 2)
CULTURE: NO GROWTH
CULTURE: NO GROWTH

## 2020-11-13 DIAGNOSIS — U071 COVID-19: Secondary | ICD-10-CM

## 2020-11-13 HISTORY — DX: COVID-19: U07.1

## 2020-11-28 ENCOUNTER — Inpatient Hospital Stay: Payer: Medicare HMO

## 2020-11-28 ENCOUNTER — Inpatient Hospital Stay
Admission: EM | Admit: 2020-11-28 | Discharge: 2020-12-04 | DRG: 871 | Disposition: A | Discharge status: Home / Self Care | Payer: Medicare HMO | Attending: Internal Medicine | Admitting: Internal Medicine

## 2020-11-28 DIAGNOSIS — R1310 Dysphagia, unspecified: Secondary | ICD-10-CM | POA: Diagnosis present

## 2020-11-28 DIAGNOSIS — F1010 Alcohol abuse, uncomplicated: Secondary | ICD-10-CM | POA: Diagnosis not present

## 2020-11-28 DIAGNOSIS — R531 Weakness: Secondary | ICD-10-CM | POA: Diagnosis not present

## 2020-11-28 DIAGNOSIS — R7989 Other specified abnormal findings of blood chemistry: Secondary | ICD-10-CM | POA: Diagnosis present

## 2020-11-28 DIAGNOSIS — F10129 Alcohol abuse with intoxication, unspecified: Secondary | ICD-10-CM | POA: Diagnosis present

## 2020-11-28 DIAGNOSIS — E872 Acidosis, unspecified: Secondary | ICD-10-CM | POA: Diagnosis present

## 2020-11-28 DIAGNOSIS — I959 Hypotension, unspecified: Secondary | ICD-10-CM | POA: Diagnosis present

## 2020-11-28 DIAGNOSIS — N179 Acute kidney failure, unspecified: Secondary | ICD-10-CM | POA: Diagnosis present

## 2020-11-28 DIAGNOSIS — R296 Repeated falls: Secondary | ICD-10-CM | POA: Diagnosis present

## 2020-11-28 DIAGNOSIS — K449 Diaphragmatic hernia without obstruction or gangrene: Secondary | ICD-10-CM | POA: Diagnosis present

## 2020-11-28 DIAGNOSIS — K31811 Angiodysplasia of stomach and duodenum with bleeding: Secondary | ICD-10-CM | POA: Diagnosis present

## 2020-11-28 DIAGNOSIS — D509 Iron deficiency anemia, unspecified: Secondary | ICD-10-CM | POA: Diagnosis present

## 2020-11-28 DIAGNOSIS — W1830XA Fall on same level, unspecified, initial encounter: Secondary | ICD-10-CM | POA: Diagnosis present

## 2020-11-28 DIAGNOSIS — K264 Chronic or unspecified duodenal ulcer with hemorrhage: Secondary | ICD-10-CM | POA: Diagnosis present

## 2020-11-28 DIAGNOSIS — R197 Diarrhea, unspecified: Secondary | ICD-10-CM | POA: Diagnosis present

## 2020-11-28 DIAGNOSIS — R42 Dizziness and giddiness: Secondary | ICD-10-CM | POA: Diagnosis present

## 2020-11-28 DIAGNOSIS — R000 Tachycardia, unspecified: Secondary | ICD-10-CM | POA: Diagnosis present

## 2020-11-28 DIAGNOSIS — Z681 Body mass index (BMI) 19 or less, adult: Secondary | ICD-10-CM

## 2020-11-28 DIAGNOSIS — R0603 Acute respiratory distress: Secondary | ICD-10-CM | POA: Diagnosis present

## 2020-11-28 DIAGNOSIS — U071 COVID-19: Principal | ICD-10-CM | POA: Diagnosis present

## 2020-11-28 DIAGNOSIS — A4189 Other specified sepsis: Secondary | ICD-10-CM | POA: Diagnosis present

## 2020-11-28 DIAGNOSIS — Y92009 Unspecified place in unspecified non-institutional (private) residence as the place of occurrence of the external cause: Secondary | ICD-10-CM | POA: Diagnosis not present

## 2020-11-28 DIAGNOSIS — K7460 Unspecified cirrhosis of liver: Secondary | ICD-10-CM | POA: Diagnosis present

## 2020-11-28 DIAGNOSIS — A419 Sepsis, unspecified organism: Secondary | ICD-10-CM | POA: Diagnosis not present

## 2020-11-28 DIAGNOSIS — D62 Acute posthemorrhagic anemia: Secondary | ICD-10-CM | POA: Diagnosis present

## 2020-11-28 DIAGNOSIS — R0602 Shortness of breath: Secondary | ICD-10-CM

## 2020-11-28 DIAGNOSIS — R112 Nausea with vomiting, unspecified: Secondary | ICD-10-CM

## 2020-11-28 DIAGNOSIS — D649 Anemia, unspecified: Secondary | ICD-10-CM | POA: Diagnosis present

## 2020-11-28 DIAGNOSIS — R6520 Severe sepsis without septic shock: Secondary | ICD-10-CM | POA: Diagnosis present

## 2020-11-28 DIAGNOSIS — R0902 Hypoxemia: Secondary | ICD-10-CM | POA: Diagnosis present

## 2020-11-28 DIAGNOSIS — K254 Chronic or unspecified gastric ulcer with hemorrhage: Secondary | ICD-10-CM | POA: Diagnosis present

## 2020-11-28 HISTORY — DX: Alcohol abuse, uncomplicated: F10.10

## 2020-11-28 LAB — COMPREHENSIVE METABOLIC PANEL
ALT: 7 U/L (ref 0–44)
AST: 17 U/L (ref 15–41)
Albumin: 2.8 g/dL — ABNORMAL LOW (ref 3.5–5.0)
Alkaline Phosphatase: 40 U/L (ref 38–126)
Anion gap: 10 (ref 5–15)
BUN: 60 mg/dL — ABNORMAL HIGH (ref 8–23)
CO2: 15 mmol/L — ABNORMAL LOW (ref 22–32)
Calcium: 8.2 mg/dL — ABNORMAL LOW (ref 8.9–10.3)
Chloride: 112 mmol/L — ABNORMAL HIGH (ref 98–111)
Creatinine, Ser: 1.75 mg/dL — ABNORMAL HIGH (ref 0.61–1.24)
GFR, Estimated: 43 mL/min — ABNORMAL LOW (ref >60)
Glucose, Bld: 196 mg/dL — ABNORMAL HIGH (ref 70–99)
Potassium: 4.3 mmol/L (ref 3.5–5.1)
Sodium: 137 mmol/L (ref 135–145)
Total Bilirubin: 0.5 mg/dL (ref 0.3–1.2)
Total Protein: 6.1 g/dL — ABNORMAL LOW (ref 6.5–8.1)

## 2020-11-28 LAB — CBC WITH DIFFERENTIAL/PLATELET
Abs Immature Granulocytes: 0.16 10*3/uL — ABNORMAL HIGH (ref 0.00–0.07)
Basophils Absolute: 0 10*3/uL (ref 0.0–0.1)
Basophils Relative: 0 %
Eosinophils Absolute: 0 10*3/uL (ref 0.0–0.5)
Eosinophils Relative: 0 %
HCT: 19.8 % — ABNORMAL LOW (ref 39.0–52.0)
Hemoglobin: 5.6 g/dL — ABNORMAL LOW (ref 13.0–17.0)
Immature Granulocytes: 1 %
Lymphocytes Relative: 6 %
Lymphs Abs: 0.7 10*3/uL (ref 0.7–4.0)
MCH: 24.2 pg — ABNORMAL LOW (ref 26.0–34.0)
MCHC: 28.3 g/dL — ABNORMAL LOW (ref 30.0–36.0)
MCV: 85.7 fL (ref 80.0–100.0)
Monocytes Absolute: 0.7 10*3/uL (ref 0.1–1.0)
Monocytes Relative: 6 %
Neutro Abs: 10.3 10*3/uL — ABNORMAL HIGH (ref 1.7–7.7)
Neutrophils Relative %: 87 %
Platelets: 247 10*3/uL (ref 150–400)
RBC: 2.31 MIL/uL — ABNORMAL LOW (ref 4.22–5.81)
RDW: 18.6 % — ABNORMAL HIGH (ref 11.5–15.5)
WBC: 11.9 10*3/uL — ABNORMAL HIGH (ref 4.0–10.5)
nRBC: 0 % (ref 0.0–0.2)

## 2020-11-28 LAB — RETICULOCYTES
Immature Retic Fract: 40.6 % — ABNORMAL HIGH (ref 2.3–15.9)
RBC.: 2.67 MIL/uL — ABNORMAL LOW (ref 4.22–5.81)
Retic Count, Absolute: 100.1 10*3/uL (ref 19.0–186.0)
Retic Ct Pct: 3.8 % — ABNORMAL HIGH (ref 0.4–3.1)

## 2020-11-28 LAB — RESP PANEL BY RT-PCR (FLU A&B, COVID) ARPGX2
Influenza A by PCR: NEGATIVE
Influenza B by PCR: NEGATIVE
SARS Coronavirus 2 by RT PCR: POSITIVE — AB

## 2020-11-28 LAB — LACTIC ACID, PLASMA
Lactic Acid, Venous: 3.5 mmol/L (ref 0.5–1.9)
Lactic Acid, Venous: 1.9 mmol/L (ref 0.5–1.9)

## 2020-11-28 LAB — ETHANOL: Alcohol, Ethyl (B): <10 mg/dL (ref <10)

## 2020-11-28 LAB — PREPARE RBC (CROSSMATCH)

## 2020-11-28 LAB — LIPASE, BLOOD: Lipase: 33 U/L (ref 11–51)

## 2020-11-28 LAB — ABO/RH: ABO/RH(D): A POS

## 2020-11-28 MED ORDER — ACETAMINOPHEN 325 MG PO TABS: 650.0000 mg | ORAL_TABLET | Freq: Four times a day (QID) | ORAL | Status: DC | PRN

## 2020-11-28 MED ORDER — SODIUM CHLORIDE 0.9 % IV SOLN
200.0000 mg | Freq: Once | INTRAVENOUS | Status: AC
  Administered 2020-11-28: 200 mg via INTRAVENOUS
  Filled 2020-11-28: qty 200

## 2020-11-28 MED ORDER — SODIUM CHLORIDE 0.9 % IV BOLUS
1000.0000 mL | Freq: Once | INTRAVENOUS | Status: AC
  Administered 2020-11-28: 1000 mL via INTRAVENOUS

## 2020-11-28 MED ORDER — THIAMINE HCL 100 MG PO TABS
250.0000 mg | ORAL_TABLET | Freq: Once | ORAL | Status: AC
  Administered 2020-11-28: 250 mg via ORAL
  Filled 2020-11-28: qty 3

## 2020-11-28 MED ORDER — ONDANSETRON HCL 4 MG/2ML IJ SOLN
4.0000 mg | Freq: Four times a day (QID) | INTRAMUSCULAR | Status: DC | PRN
  Administered 2020-11-29: 4 mg via INTRAVENOUS
  Filled 2020-11-28: qty 2

## 2020-11-28 MED ORDER — ACETAMINOPHEN 650 MG RE SUPP: 650.0000 mg | Freq: Four times a day (QID) | RECTAL | Status: DC | PRN

## 2020-11-28 MED ORDER — SODIUM CHLORIDE 0.9 % IV SOLN
10.0000 mL/h | Freq: Once | INTRAVENOUS | Status: AC
  Administered 2020-11-28: 10 mL/h via INTRAVENOUS

## 2020-11-28 MED ORDER — FOLIC ACID 1 MG PO TABS
1.0000 mg | ORAL_TABLET | Freq: Every day | ORAL | Status: DC
  Administered 2020-11-30 – 2020-12-04 (×5): 1 mg via ORAL
  Filled 2020-11-28 (×5): qty 1

## 2020-11-28 MED ORDER — ONDANSETRON HCL 4 MG/2ML IJ SOLN
4.0000 mg | Freq: Once | INTRAMUSCULAR | Status: AC
  Administered 2020-11-28: 4 mg via INTRAVENOUS
  Filled 2020-11-28: qty 2

## 2020-11-28 MED ORDER — ALBUTEROL SULFATE HFA 108 (90 BASE) MCG/ACT IN AERS
1.0000 | INHALATION_SPRAY | RESPIRATORY_TRACT | Status: DC | PRN
  Filled 2020-11-28: qty 6.7

## 2020-11-28 MED ORDER — SODIUM CHLORIDE 0.9 % IV SOLN
100.0000 mg | Freq: Every day | INTRAVENOUS | Status: AC
  Administered 2020-11-29 – 2020-11-30 (×2): 100 mg via INTRAVENOUS
  Filled 2020-11-28: qty 20
  Filled 2020-11-28: qty 100

## 2020-11-28 MED ORDER — ADULT MULTIVITAMIN W/MINERALS CH
1.0000 | ORAL_TABLET | Freq: Every day | ORAL | Status: DC
  Administered 2020-11-30 – 2020-12-04 (×5): 1 via ORAL
  Filled 2020-11-28 (×5): qty 1

## 2020-11-28 MED ORDER — THIAMINE HCL 100 MG PO TABS
100.0000 mg | ORAL_TABLET | Freq: Every day | ORAL | Status: DC
  Administered 2020-11-30 – 2020-12-04 (×5): 100 mg via ORAL
  Filled 2020-11-28 (×5): qty 1

## 2020-11-28 NOTE — H&P (Signed)
History and Physical    PLEASE NOTE THAT DRAGON DICTATION SOFTWARE WAS USED IN THE CONSTRUCTION OF THIS NOTE.   [name] JKK:[id number] DOB: [birth year] DOA: 11/28/2020  PCP: No primary care provider on file. Patient coming from: home   I have personally briefly reviewed patient's old medical records in [place]  Chief Complaint: fall   HPI: John Escobar is a 65 y.o. male with medical history significant for chronic alcohol abuse, who is admitted to [facility] on 11/28/2020 with acute anemia after presenting from home to [facility] ED complaining of fall.   The patient reports a 2 ground-level falls over the last day in which he reports falling to the floor as a result of dizziness with ambulation.  Denies any overt presyncope or syncope.  Reports that he did not hit his head as a component of either these falls, but found himself to week and a general sense in order to get up off the floor following the second of central.  This prompted EMS to be called, with the patient subsequently brought to [facility] ED for further evaluation of fall.   The patient denies any preceding her ensuing chest pain, palpitations, diaphoresis.  He does however report 2 days of shortness of breath in the absence of any associated orthopnea, PND, or peripheral edema.  Denies any recent melena or hematochezia.  Also denies any associated abdominal pain, nausea, vomiting, hematemesis.  Denies any history of gastrointestinal bleed.  Not on any blood thinning agents at home, including no aspirin.  Denies any routine use of NSAIDs.  Acknowledges a history of chronic alcohol consumption which she reports consuming 12 oz beers x 3 on a daily basis, and reports consumption at this point for the last several years.  Most recent alcohol was consumed on the morning of 11/28/2020, and the patient denies any history of alcohol withdrawal symptoms when he has previously underwent prolonged periods of  abstinence from alcohol in the past.    Per chart review, most recent prior hemoglobin data point noted to be 13.8 in January 2017.  Patient does not believe that his undergone prior colonoscopy or EGD.  Denies any recent dysuria, gross hematuria, or change in urinary urgency/frequency.  Reports 2 to 3 days of subjective fever in the absence of any associated chills, rigors, or generalized myalgias.  No recent headache, neck stiffness, rhinitis, rhinorrhea, sore throat, wheezing, cough.  No recent traveling or known COVID-19 exposures.  Acknowledges that he has not received the COVID-19 vaccine.  Denies recent hemoptysis, lower extremity erythema, or calf tenderness.        ED Course:  Vital signs in the ED were notable for the following: Temperature max 98.7; heart rate 10 1-1 09; blood pressure 123/79 - 146/88; respiratory rate 17-20, oxygen saturation 100% on room air.  Labs were notable for the following: CMP was notable for the following: Sodium 137, potassium 4.3, bicarbonate 15, anion gap 10, BUN 60, creatinine 1.75, which is relative to most recent prior creatinine data point of 1.97 in January 2017, glucose 196, albumin 2.8, AST 17, ALT 7, total bilirubin 0.5.  Initial lactic acid noted to be 3.5, with repeat value trending down to 1.9 following interval IV fluids, as further quantified below.  CBC notable for white cell count of 11,900, hemoglobin 5.6, relative to most recent prior value 13.8 in January 2017, with presenting hemoglobin associated with normocytic, hypochromic findings as well as an elevated RDW of 18.6.;  Today CBC also reflects platelet count of 247.  Serum ethanol less than 10.  Nasopharyngeal COVID-19 PCR performed in the ED today is found to be positive.  While in the ED, patient was typed and screened for 2 units PRBC, with initiation for such unit occurring in the ED. While in ED, he also received a 1 L normal saline bolus.  Subsequently, the patient was admitted to the  med telemetry floor for further evaluation management of acute anemia, with incidental COVID-19 positive finding.     Review of Systems: As per HPI otherwise 10 point review of systems negative.   Past Medical History:  Diagnosis Date  . ETOH abuse     Past Surgical History:  Procedure Laterality Date  . ELBOW BURSA SURGERY    . SHOULDER SURGERY Left     Social History:  reports that he has never smoked. He has never used smokeless tobacco. He reports current alcohol use. He reports previous drug use.   No Known Allergies  History reviewed. No pertinent family history.   Prior to Admission medications   Not on File  (Patient denies any use of prescription or over the counter medications at home).    Objective    Physical Exam: Vitals:   11/28/20 1510 11/28/20 1511 11/28/20 1613 11/28/20 1712  BP: 123/79  125/88 130/74  Pulse: 100  (!) 108 (!) 109  Resp: 18  20 (!) 28  Temp: 98.8 F (37.1 C)   98.2 F (36.8 C)  TempSrc: Oral   Oral  SpO2: 95%  100% 100%  Weight:  85 kg    Height:  6' 1"  (1.854 m)      General: appears to be stated age; alert, oriented Skin: warm, dry, no rash Head:  AT/ Mouth:  Oral mucosa membranes appear dry, normal dentition Neck: supple; trachea midline Heart: Mildly tachycardic, but regular; did not appreciate any M/R/G Lungs: CTAB, did not appreciate any wheezes, rales, or rhonchi Abdomen: + BS; soft, ND, NT Vascular: 2+ pedal pulses b/l; 2+ radial pulses b/l Extremities: no peripheral edema, no muscle wasting Neuro: strength and sensation intact in upper and lower extremities b/l    Labs on Admission: I have personally reviewed following labs and imaging studies  CBC: Recent Labs  Lab 11/28/20 1518  WBC 11.9*  NEUTROABS 10.3*  HGB 5.6*  HCT 19.8*  MCV 85.7  PLT 443   Basic Metabolic Panel: Recent Labs  Lab 11/28/20 1518  NA 137  K 4.3  CL 112*  CO2 15*  GLUCOSE 196*  BUN 60*  CREATININE 1.75*  CALCIUM 8.2*    GFR: Estimated Creatinine Clearance: 48.2 mL/min (A) (by C-G formula based on SCr of 1.75 mg/dL (H)). Liver Function Tests: Recent Labs  Lab 11/28/20 1518  AST 17  ALT 7  ALKPHOS 40  BILITOT 0.5  PROT 6.1*  ALBUMIN 2.8*   Recent Labs  Lab 11/28/20 1518  LIPASE 33   No results for input(s): AMMONIA in the last 168 hours. Coagulation Profile: No results for input(s): INR, PROTIME in the last 168 hours. Cardiac Enzymes: No results for input(s): CKTOTAL, CKMB, CKMBINDEX, TROPONINI in the last 168 hours. BNP (last 3 results) No results for input(s): PROBNP in the last 8760 hours. HbA1C: No results for input(s): HGBA1C in the last 72 hours. CBG: No results for input(s): GLUCAP in the last 168 hours. Lipid Profile: No results for input(s): CHOL, HDL, LDLCALC, TRIG, CHOLHDL, LDLDIRECT in the last 72 hours. Thyroid Function  Tests: No results for input(s): TSH, T4TOTAL, FREET4, T3FREE, THYROIDAB in the last 72 hours. Anemia Panel: No results for input(s): VITAMINB12, FOLATE, FERRITIN, TIBC, IRON, RETICCTPCT in the last 72 hours. Urine analysis:    Component Value Date/Time   COLORURINE Yellow 04/28/2012 1747   APPEARANCEUR Hazy 04/28/2012 1747   LABSPEC 1.016 04/28/2012 1747   PHURINE 5.0 04/28/2012 1747   GLUCOSEU Negative 04/28/2012 1747   HGBUR 2+ 04/28/2012 1747   BILIRUBINUR Negative 04/28/2012 1747   KETONESUR Trace 04/28/2012 1747   PROTEINUR Negative 04/28/2012 1747   NITRITE Negative 04/28/2012 1747   LEUKOCYTESUR Trace 04/28/2012 1747    Radiological Exams on Admission: No results found.    Assessment/Plan   AVANISH CERULLO is a 65 y.o. male with medical history significant for chronic alcohol abuse, who is admitted to [facility] on 11/28/2020 with acute anemia after presenting from home to [facility] ED complaining of fall.    Principal Problem:   Acute anemia Active Problems:   Generalized weakness   Elevated serum creatinine    COVID-19 virus infection   Lactic acidosis   Severe sepsis (HCC)   Chronic alcohol abuse    #) Acute anemia: Presenting hemoglobin noted to be 5.6, with chronicity not completely clear at this time given most recent prior hemoglobin data point occurred in January 2017, at which time hemoglobin was noted to be 13.8.  Suspect some degree of chronicity to the patient's anemia given his relative hemodynamic stability, with very mild tachycardia noted at initial presentation.  His anemia does not appear to be symptomatic presenting falls as a result of recent dizziness as well as 2 days of new onset shortness of breath, as further described above.  At this time, there is no clear indication of acute blood loss, with the patient denying any recent melena, hematochezia, or hematemesis.  He has refused DRE for evaluation of Hemoccult stool, but BM in the ED was noted by brown and without dark coloration or evidence of hematochezia.  Suspect that presenting anemia is multifactorial in nature, with contributions from chronic alcohol abuse as well as associated malnutrition and potential B12/folic acid deficiency, although it is noted presenting hemoglobin is associated with normocytic findings.  Not on any blood thinners as an outpatient.  [place] acknowledges history of chronic alcohol use, denies any known history of underlying liver disease, and denies any history of gastrointestinal bleed.  Type and screen in the ED, with initiation of transfusion of 2 units prbc.    Plan: Monitor on telemetry. Monitor continuous pulse-ox. CBC and INR in the morning. Refraining from pharmacologic anticoagulation.  Continue transfusion of 2 units PRBC. will closely monitor ensuing Hgb levels and correlate these data points with the patient's overall clinical picture including vital signs to determine need for additional transfusion beyond the current 2 units prbc. Q4H H&H's have been ordered through [place] on 11/29/20.  Add on the  following to pretransfusion labs: Total iron, TIBC, ferritin, MMA, folic acid, reticulocyte count, peripheral smear.  Repeat CMP in the morning.      #) Ground-level falls: Presents with complaint of 2 ground-level falls over the last day, which appear to be a consequence of dizziness in the setting of presenting acute anemia, as above.  Not associate with a loss of consciousness, does not appear the patient has had assessment of these falls.  Differential also includes vitamin B12 deficiency impacting dorsal, in the setting of chronic alcohol use.   Plan: Management of presenting  acute anemia, including PRBC transfusion, as above.  Fall precautions ordered.  Check MMA to evaluate vitamin B12 status.        #) Generalized weakness: the patient reports 1-2 day duration of generalized weakness, in the absence of any evidence of acute focal neurologic deficits, including no evidence of acute focal weakness. Consequently, acute ischemic CVA is felt to be less likely at this time.  May have contributed to presenting ground-level falls.  Suspect that etiology is multifactorial in nature, with contributions from presenting acute anemia as well as chronic alcohol abuse and consequential malnutrition as well as contribution from COVID-19 positive finding with associated criteria for severe sepsis, as further described below.   Plan: work-up and management of presenting acute anemia, as described above. Physical therapy consult has been placed for tomorrow morning.  Check TSH.  Check vitamin B12, as above.  Fall precautions.  Check urinalysis.  Recommend management of COVID-19 infection, as above.         #) COVID-19 infection: diagnosis on the basis of 2 to 3 days of shortness of breath and subjective fever, with nasopharyngeal COVID-19 PCR performed in the ED this evening found to be positive. Of note, presentation does not appear to be associated with acute hypoxic respiratory distress/failure,  with patient maintaining O2 sats greater than 94% on room air. Overall, it does not appear that criteria are met at the present time for patient's COVID-19 infection to be considered severe in nature. Consequently, there does not appear to be an indication at this time for initiation of dexamethasone per treatment guidance recommendations from [facility]'s Covid Treatment Guidelines. CXR and inflammatory markers to be checked, with results of such currently pending.   It is important to note that COVID-19 infection does not represent the admission diagnosis for this hospitalization, and that the patient is not being hospitalized specifically for additional evaluation and management of COVID-19. However, given that there is evidence that initiation of remdesivir early in the course of infection can decrease the likelihood of progression of the severity of COVID-19 in those that are considered to be at high risk for a more complicated course of the infection, it is important to evaluate if this patient meets criteria for a brief course of remdesivir on this basis.   In the setting of patient's age greater than 58, this patient meets criteria to be considered high risk for a more complicated clinical course of COVID-19 infection, including increased probability for progression of the severity associated with their infectious course. Therefore, even though the patient is not being admitted specifically for further evaluation and management of COVID-19 infection, given that the duration since onset of patient's respiratory symptoms is less than 7 days in the context of the presence of high risk criteria, indications are met for initiation of a 3-day course of remdesivir due to associated benefit of decreased risk for progression of severity of their COVID-19 infection with early initiation of remdesivir in this setting, per treatment guidance recommendations from [place]'s Covid Treatment Guidelines. Of note,  ALT found to be less than 220. Therefore, there is no contraindication for initiation of remdesivir on the basis of transaminitis.  No known chronic underlying pulmonary pathology. Denies any known or suspected COVID-19 exposures. Vaccine status: Patient acknowledges that he is not vaccinated against COVID-19.    Plan: Airborne and contact precautions. Monitor continuous pulse oximetry and monitor on telemetry. prn supplemental O2 to maintain O2 sats greater than or equal to 94%. Proning protocol  initiated. PRN albuterol inhaler. PRN acetaminophen for fever. Start remdesivir, as above. Refraining from initiation of dexamethasone, as above. Check inflammatory markers (fibrinogen, fibrin derivatives, crp, ferritin, LDH) now and again in the morning, with attention to trend. Check serum magnesium and phosphorus levels. Check CMP and CBC in the morning. Flutter valve and incentive spirometry. CXR.        #) Severe Sepsis: Appears to be on the basis of COVID-19, as above, with SIRS criteria met via presenting leukocytosis and tachycardia.  Patient's sepsis meets criteria to be consider severe in nature on the basis of elevated initial lactic acid of 3.5, which is subsequently trended down to 1.9 following interval IV fluids. No evidence of associated hypotension thus far. In the absence of LA level greater than or equal to 4.0 and in the absence of hypotension, criteria are not met at this time for initiation of a 30 mL/kg IVF bolus. No evidence of additional underlying infectious process beyond COVID-19, although will expand further infectious work-up to include urinalysis and chest x-ray. As patient's sepsis is felt to be viral in nature, will refrain from antibiotic coverage at this time, pending additional infectious evaluation, as described below.  Plan: Work-up and management of COVID-19 infection, as above. Check blood cultures x2.  Repeat CBC with differential in the morning.  Check urinalysis. PRN  acetaminophen for fever. Will refrain from antibiotics for now per above rationale.  Chest x-ray.  Check urinalysis.        #) Lactic acidosis: Presenting lactate noted to be 3.5, with 1 L normal saline bolus.  Suspect that this is multifactorial in nature, with potential contributions from severe sepsis versus keto lactic acidosis versus generalized tissue hypoperfusion in the setting of acute anemia resulting in diminished oxygen carrying capacity as well as oxygen delivery capacity.  Of note, presenting labs reflect no associated elevated anion gap.    Plan: Work-up and management presenting acute anemia, as above.  Monitor on telemetry.  Repeat CMP in the morning.  Monitor continuous pulse oximetry. UA. CXR, as above.  Continue intravascular resuscitative efforts via transfusion of 2 units PRBC, as above.  Work-up and management of presenting COVID-19 infection, as above.          #) Elevated creatinine: Presenting serum creatinine 1.75, the chronicity of such is unclear as most recent prior creatinine data point occurred in January 2017, which creatinine was noted to be 1.97, prior to that, call preceding creatinine data points occurred in 2013, with creatinine range at that time noted to be 0.9-1.3.   Plan: Check urinalysis with microscopy.  Add on random urine sodium as well as random urine creatinine.  Monitor strict I's and O's and daily weights.  Attempt avoid nephrotoxic agents.  Refrain from NSAIDs.  Repeat BMP in the morning.       #) Chronic Alcohol Abuse: the patient reports typical daily alcohol consumption of 12 oz beers x 3 beers/day, and that typical daily alcohol consumption of this volume has been occurring for several years. Denies any interest in alcohol consumption discontinuation at this time. Most recent alcohol consumption occurred on the morning of 11/28/2020. Close monitoring for development of evidence of alcohol withdrawal, including close attention to trend  in vital signs, as well as close monitoring of electrolytes, as described below.  Of note, presenting serum ethanol found to be less than 10.  If the patient is accurately conveyed the timing of his most recent alcohol consumption, he would appear less likely that  he would develop alcohol withdrawal symptoms over the course of the next day.    Plan: counseled the patient on the importance of reduction in alcohol consumption. Consult to transition of care team placed. Close monitoring of ensuing BP and HR via routine VS. add on serum magnesium level.  Check serum phosphorus level and ionized calcium level.  Repeat CMP in the morning.  Add on INR.  Thiamine 250 mg p.o. x1 now followed by initiation of thiamine 100 mg po qdaily.  Start daily folic acid and multivitamin. UDS.        DVT prophylaxis: SCDs Code Status: Full code Family Communication: none Disposition Plan: Per Rounding Team Consults called: none  Admission status: inpatient; med-tele    Of note, this patient was added by me to the following Admit List/Treatment Team: [facility].      PLEASE NOTE THAT DRAGON DICTATION SOFTWARE WAS USED IN THE CONSTRUCTION OF THIS NOTE.   [place] [facility] Pager [phone] From 12PM - 12AM  Otherwise, please contact night-coverage  [email or web address] Password [facility]   11/28/2020, 5:26 PM

## 2020-11-28 NOTE — ED Notes (Signed)
Patient transported to floor by primary RN. All belongings with patient.

## 2020-11-28 NOTE — ED Notes (Signed)
Patient assisted into hospital bed. Blood transfusion completed during transport. Vitals taken at completion of infusion and receiving RN aware. Patient has call bell within reach and is able to use. Belongings at bedside.

## 2020-11-28 NOTE — ED Triage Notes (Signed)
N/V/D, hypotensive per ems, etoh

## 2020-11-28 NOTE — ED Provider Notes (Signed)
[facility] Emergency Department Provider Note   ____________________________________________   Event Date/Time   First MD Initiated Contact with Patient 11/28/20 1725     (approximate)  I have reviewed the triage vital signs and the nursing notes.   HISTORY  Chief Complaint Nausea (Nausea, vomiting, diarrhea, hypotensive, etoh )    HPI John Escobar is a 65 y.o. male with a history of severe alcohol abuse who presents for vomiting, weakness, diarrhea, and falls over the last 3 days.  EMS noted patient to be hypotensive in route as well.  This hypotension improved with IV fluid administration.  Patient only complains of nausea and vomiting over this time as well as increased weakness.  Patient denies any exacerbating relieving factors for these symptoms.  Patient currently denies any vision changes, tinnitus, difficulty speaking, facial droop, sore throat, chest pain, shortness of breath, abdominal pain,  dysuria, or numbness/paresthesias in any extremity         Past Medical History:  Diagnosis Date  . ETOH abuse     Patient Active Problem List   Diagnosis Date Noted  . Acute anemia 11/28/2020    Past Surgical History:  Procedure Laterality Date  . ELBOW BURSA SURGERY    . SHOULDER SURGERY Left     Prior to Admission medications   Not on File    Allergies Patient has no known allergies.  History reviewed. No pertinent family history.  Social History Social History   Tobacco Use  . Smoking status: Never Smoker  . Smokeless tobacco: Never Used  Substance Use Topics  . Alcohol use: Yes    Review of Systems Constitutional: No fever/chills Eyes: No visual changes. ENT: No sore throat. Cardiovascular: Denies chest pain. Respiratory: Denies shortness of breath. Gastrointestinal: Endorses abdominal pain, nausea, vomiting, and diarrhea Genitourinary: Negative for dysuria. Musculoskeletal: Negative for acute arthralgias Skin:  Negative for rash. Neurological: Positive for generalized weakness.  Negative for headaches, numbness/paresthesias in any extremity Psychiatric: Negative for suicidal ideation/homicidal ideation   ____________________________________________   PHYSICAL EXAM:  VITAL SIGNS: ED Triage Vitals  Enc Vitals Group     BP 11/28/20 1510 123/79     Pulse Rate 11/28/20 1510 100     Resp 11/28/20 1510 18     Temp 11/28/20 1510 98.8 F (37.1 C)     Temp Source 11/28/20 1510 Oral     SpO2 11/28/20 1510 95 %     Weight 11/28/20 1511 187 lb 6.3 oz (85 kg)     Height 11/28/20 1511 6\' 1"  (1.854 m)     Head Circumference --      Peak Flow --      Pain Score 11/28/20 1511 0     Pain Loc --      Pain Edu? --      Excl. in GC? --    Constitutional: Alert and oriented. Well appearing and in no acute distress. Eyes: Conjunctivae are normal. PERRL. Head: Atraumatic. Nose: No congestion/rhinnorhea. Mouth/Throat: Mucous membranes are moist. Neck: No stridor Cardiovascular: Grossly normal heart sounds.  Good peripheral circulation. Respiratory: Normal respiratory effort.  No retractions. Gastrointestinal: Soft and nontender. No distention. Musculoskeletal: No obvious deformities Neurologic:  Normal speech and language. No gross focal neurologic deficits are appreciated. Skin:  Skin is warm and dry. No rash noted. Psychiatric: Mood and affect are normal. Speech and behavior are normal.  ____________________________________________   LABS (all labs ordered are listed, but only abnormal results are displayed)  Labs Reviewed  RESP PANEL BY RT-PCR (FLU A&B, COVID) ARPGX2 - Abnormal; Notable for the following components:      Result Value   SARS Coronavirus 2 by RT PCR POSITIVE (*)    All other components within normal limits  COMPREHENSIVE METABOLIC PANEL - Abnormal; Notable for the following components:   Chloride 112 (*)    CO2 15 (*)    Glucose, Bld 196 (*)    BUN 60 (*)    Creatinine, Ser  1.75 (*)    Calcium 8.2 (*)    Total Protein 6.1 (*)    Albumin 2.8 (*)    GFR, Estimated 43 (*)    All other components within normal limits  CBC WITH DIFFERENTIAL/PLATELET - Abnormal; Notable for the following components:   WBC 11.9 (*)    RBC 2.31 (*)    Hemoglobin 5.6 (*)    HCT 19.8 (*)    MCH 24.2 (*)    MCHC 28.3 (*)    RDW 18.6 (*)    Neutro Abs 10.3 (*)    Abs Immature Granulocytes 0.16 (*)    All other components within normal limits  LACTIC ACID, PLASMA - Abnormal; Notable for the following components:   Lactic Acid, Venous 3.5 (*)    All other components within normal limits  ETHANOL  LIPASE, BLOOD  LACTIC ACID, PLASMA  TYPE AND SCREEN  PREPARE RBC (CROSSMATCH)  ABO/RH    PROCEDURES  Procedure(s) performed (including Critical Care):  .1-3 Lead EKG Interpretation Performed by: Merwyn Katos, MD Authorized by: Merwyn Katos, MD     Interpretation: normal     ECG rate:  88   ECG rate assessment: normal     Rhythm: sinus rhythm     Ectopy: none     Conduction: normal       ____________________________________________   INITIAL IMPRESSION / ASSESSMENT AND PLAN / ED COURSE  As part of my medical decision making, I reviewed the following data within the electronic MEDICAL RECORD NUMBER Nursing notes reviewed and incorporated, Labs reviewed, Old chart reviewed, and Notes from prior ED visits reviewed and incorporated        Presentation most consistent with Viral Syndrome.  Patient has tested positive for COVID-19. At this time patient is also symptomatically anemic requiring transfusion of multiple units.  Given History and Exam I have a lower suspicion for: Emergent CardioPulmonary causes [such as Acute Asthma or COPD Exacerbation, acute Heart Failure or exacerbation, PE, PTX, atypical ACS, PNA]. Emergent Otolaryngeal causes [such as PTA, RPA, Ludwigs, Epiglottitis, EBV].  Regarding Emergent Travel or Immunosuppressive related infectious: I have a  low suspicion for acute HIV.  Given generalized weakness, multiple falls, symptomatic anemia and need for further evaluation and management, patient will require admission      ____________________________________________   FINAL CLINICAL IMPRESSION(S) / ED DIAGNOSES  Final diagnoses:  COVID-19 virus infection  Symptomatic anemia  Nausea vomiting and diarrhea  Generalized weakness     ED Discharge Orders    None       Note:  This document was prepared using Dragon voice recognition software and may include unintentional dictation errors.   Merwyn Katos, MD 11/28/20 2030

## 2020-11-29 ENCOUNTER — Other Ambulatory Visit: Payer: Self-pay

## 2020-11-29 ENCOUNTER — Encounter: Payer: Self-pay | Admitting: Internal Medicine

## 2020-11-29 ENCOUNTER — Inpatient Hospital Stay: Payer: Medicare HMO | Admitting: Certified Registered Nurse Anesthetist

## 2020-11-29 ENCOUNTER — Encounter
Admission: EM | Disposition: A | Discharge status: Home / Self Care | Payer: Self-pay | Source: Home / Self Care | Attending: Internal Medicine

## 2020-11-29 DIAGNOSIS — D649 Anemia, unspecified: Secondary | ICD-10-CM

## 2020-11-29 HISTORY — PX: ESOPHAGOGASTRODUODENOSCOPY (EGD) WITH PROPOFOL: SHX5813

## 2020-11-29 LAB — SODIUM, URINE, RANDOM: Sodium, Ur: 63 mmol/L

## 2020-11-29 LAB — COMPREHENSIVE METABOLIC PANEL
ALT: 8 U/L (ref 0–44)
AST: 13 U/L — ABNORMAL LOW (ref 15–41)
Albumin: 2.6 g/dL — ABNORMAL LOW (ref 3.5–5.0)
Alkaline Phosphatase: 35 U/L — ABNORMAL LOW (ref 38–126)
Anion gap: 9 (ref 5–15)
BUN: 72 mg/dL — ABNORMAL HIGH (ref 8–23)
CO2: 19 mmol/L — ABNORMAL LOW (ref 22–32)
Calcium: 8.5 mg/dL — ABNORMAL LOW (ref 8.9–10.3)
Chloride: 116 mmol/L — ABNORMAL HIGH (ref 98–111)
Creatinine, Ser: 1.78 mg/dL — ABNORMAL HIGH (ref 0.61–1.24)
GFR, Estimated: 42 mL/min — ABNORMAL LOW (ref >60)
Glucose, Bld: 128 mg/dL — ABNORMAL HIGH (ref 70–99)
Potassium: 4.6 mmol/L (ref 3.5–5.1)
Sodium: 144 mmol/L (ref 135–145)
Total Bilirubin: 0.3 mg/dL (ref 0.3–1.2)
Total Protein: 5.5 g/dL — ABNORMAL LOW (ref 6.5–8.1)

## 2020-11-29 LAB — IRON AND TIBC
Iron: 16 ug/dL — ABNORMAL LOW (ref 45–182)
Saturation Ratios: 5 % — ABNORMAL LOW (ref 17.9–39.5)
TIBC: 358 ug/dL (ref 250–450)
UIBC: 342 ug/dL

## 2020-11-29 LAB — URINALYSIS, COMPLETE (UACMP) WITH MICROSCOPIC
Bacteria, UA: NONE SEEN
Bilirubin Urine: NEGATIVE
Glucose, UA: NEGATIVE mg/dL
Hgb urine dipstick: NEGATIVE
Ketones, ur: NEGATIVE mg/dL
Leukocytes,Ua: NEGATIVE
Nitrite: NEGATIVE
Protein, ur: NEGATIVE mg/dL
Specific Gravity, Urine: 1.014 (ref 1.005–1.030)
Squamous Epithelial / HPF: NONE SEEN (ref 0–5)
pH: 5 (ref 5.0–8.0)

## 2020-11-29 LAB — CREATININE, URINE, RANDOM: Creatinine, Urine: 53 mg/dL

## 2020-11-29 LAB — URINE DRUG SCREEN, QUALITATIVE (ARMC ONLY)
Amphetamines, Ur Screen: NOT DETECTED
Barbiturates, Ur Screen: NOT DETECTED
Benzodiazepine, Ur Scrn: NOT DETECTED
Cannabinoid 50 Ng, Ur ~~LOC~~: NOT DETECTED
Cocaine Metabolite,Ur ~~LOC~~: NOT DETECTED
MDMA (Ecstasy)Ur Screen: NOT DETECTED
Methadone Scn, Ur: NOT DETECTED
Opiate, Ur Screen: NOT DETECTED
Phencyclidine (PCP) Ur S: NOT DETECTED
Tricyclic, Ur Screen: NOT DETECTED

## 2020-11-29 LAB — HIV ANTIBODY (ROUTINE TESTING W REFLEX): HIV Screen 4th Generation wRfx: NONREACTIVE

## 2020-11-29 LAB — PATHOLOGIST SMEAR REVIEW

## 2020-11-29 LAB — PROTIME-INR
INR: 1.2 (ref 0.8–1.2)
INR: 1.2 (ref 0.8–1.2)
Prothrombin Time: 14.7 seconds (ref 11.4–15.2)
Prothrombin Time: 14.4 seconds (ref 11.4–15.2)

## 2020-11-29 LAB — HEMOGLOBIN AND HEMATOCRIT, BLOOD
HCT: 19.3 % — ABNORMAL LOW (ref 39.0–52.0)
HCT: 16.6 % — ABNORMAL LOW (ref 39.0–52.0)
Hemoglobin: 5.7 g/dL — ABNORMAL LOW (ref 13.0–17.0)
Hemoglobin: 5.1 g/dL — ABNORMAL LOW (ref 13.0–17.0)

## 2020-11-29 LAB — CBC WITH DIFFERENTIAL/PLATELET
Abs Immature Granulocytes: 0.1 10*3/uL — ABNORMAL HIGH (ref 0.00–0.07)
Basophils Absolute: 0 10*3/uL (ref 0.0–0.1)
Basophils Relative: 0 %
Eosinophils Absolute: 0 10*3/uL (ref 0.0–0.5)
Eosinophils Relative: 0 %
HCT: 22.5 % — ABNORMAL LOW (ref 39.0–52.0)
Hemoglobin: 7.1 g/dL — ABNORMAL LOW (ref 13.0–17.0)
Immature Granulocytes: 1 %
Lymphocytes Relative: 8 %
Lymphs Abs: 0.7 10*3/uL (ref 0.7–4.0)
MCH: 27.2 pg (ref 26.0–34.0)
MCHC: 31.6 g/dL (ref 30.0–36.0)
MCV: 86.2 fL (ref 80.0–100.0)
Monocytes Absolute: 0.9 10*3/uL (ref 0.1–1.0)
Monocytes Relative: 10 %
Neutro Abs: 7.5 10*3/uL (ref 1.7–7.7)
Neutrophils Relative %: 81 %
Platelets: 171 10*3/uL (ref 150–400)
RBC: 2.61 MIL/uL — ABNORMAL LOW (ref 4.22–5.81)
RDW: 17.6 % — ABNORMAL HIGH (ref 11.5–15.5)
WBC: 9.3 10*3/uL (ref 4.0–10.5)
nRBC: 0 % (ref 0.0–0.2)

## 2020-11-29 LAB — MAGNESIUM
Magnesium: 2.2 mg/dL (ref 1.7–2.4)
Magnesium: 2.1 mg/dL (ref 1.7–2.4)

## 2020-11-29 LAB — D-DIMER, QUANTITATIVE
D-Dimer, Quant: 0.52 ug/mL-FEU — ABNORMAL HIGH (ref 0.00–0.50)
D-Dimer, Quant: 0.49 ug/mL-FEU (ref 0.00–0.50)

## 2020-11-29 LAB — C-REACTIVE PROTEIN
CRP: <0.5 mg/dL (ref <1.0)
CRP: 0.6 mg/dL (ref <1.0)

## 2020-11-29 LAB — FOLATE: Folate: 9.7 ng/mL (ref >5.9)

## 2020-11-29 LAB — TSH: TSH: 2.668 u[IU]/mL (ref 0.350–4.500)

## 2020-11-29 LAB — FIBRINOGEN
Fibrinogen: 300 mg/dL (ref 210–475)
Fibrinogen: 299 mg/dL (ref 210–475)

## 2020-11-29 LAB — LACTATE DEHYDROGENASE
LDH: 105 U/L (ref 98–192)
LDH: 99 U/L (ref 98–192)

## 2020-11-29 LAB — FERRITIN
Ferritin: 9 ng/mL — ABNORMAL LOW (ref 24–336)
Ferritin: 9 ng/mL — ABNORMAL LOW (ref 24–336)

## 2020-11-29 LAB — PHOSPHORUS: Phosphorus: 3.8 mg/dL (ref 2.5–4.6)

## 2020-11-29 LAB — PROCALCITONIN: Procalcitonin: 0.16 ng/mL

## 2020-11-29 SURGERY — ESOPHAGOGASTRODUODENOSCOPY (EGD) WITH PROPOFOL
Anesthesia: General

## 2020-11-29 MED ORDER — ACETAMINOPHEN 325 MG PO TABS
650.0000 mg | ORAL_TABLET | Freq: Four times a day (QID) | ORAL | Status: DC | PRN
  Administered 2020-12-03 – 2020-12-04 (×3): 650 mg via ORAL
  Filled 2020-11-29 (×3): qty 2

## 2020-11-29 MED ORDER — SODIUM CHLORIDE 0.9 % IV SOLN
8.0000 mg/h | INTRAVENOUS | Status: DC
  Administered 2020-11-29 – 2020-12-01 (×5): 8 mg/h via INTRAVENOUS
  Filled 2020-11-29 (×7): qty 80

## 2020-11-29 MED ORDER — PROPOFOL 10 MG/ML IV BOLUS
INTRAVENOUS | Status: DC | PRN
  Administered 2020-11-29: 100 mg via INTRAVENOUS

## 2020-11-29 MED ORDER — LORAZEPAM 1 MG PO TABS: 1.0000 mg | ORAL_TABLET | ORAL | Status: AC | PRN

## 2020-11-29 MED ORDER — SODIUM CHLORIDE 0.9 % IV SOLN: INTRAVENOUS | Status: DC

## 2020-11-29 MED ORDER — PANTOPRAZOLE SODIUM 40 MG IV SOLR: 40.0000 mg | Freq: Two times a day (BID) | INTRAVENOUS | Status: DC

## 2020-11-29 MED ORDER — SUCCINYLCHOLINE CHLORIDE 20 MG/ML IJ SOLN
INTRAMUSCULAR | Status: DC | PRN
  Administered 2020-11-29: 100 mg via INTRAVENOUS

## 2020-11-29 MED ORDER — SODIUM CHLORIDE 0.9 % IV SOLN
50.0000 ug/h | INTRAVENOUS | Status: DC
  Administered 2020-11-29: 50 ug/h via INTRAVENOUS
  Filled 2020-11-29: qty 1

## 2020-11-29 MED ORDER — ONDANSETRON HCL 4 MG/2ML IJ SOLN
INTRAMUSCULAR | Status: DC | PRN
  Administered 2020-11-29: 4 mg via INTRAVENOUS

## 2020-11-29 MED ORDER — DM-GUAIFENESIN ER 30-600 MG PO TB12
1.0000 | ORAL_TABLET | Freq: Two times a day (BID) | ORAL | Status: DC | PRN
  Administered 2020-11-29: 1 via ORAL
  Filled 2020-11-29: qty 1

## 2020-11-29 MED ORDER — SENNOSIDES-DOCUSATE SODIUM 8.6-50 MG PO TABS: 1.0000 | ORAL_TABLET | Freq: Every evening | ORAL | Status: DC | PRN

## 2020-11-29 MED ORDER — ONDANSETRON HCL 4 MG/2ML IJ SOLN
INTRAMUSCULAR | Status: AC
  Filled 2020-11-29: qty 2

## 2020-11-29 MED ORDER — OCTREOTIDE LOAD VIA INFUSION
50.0000 ug | Freq: Once | INTRAVENOUS | Status: AC
  Administered 2020-11-29: 50 ug via INTRAVENOUS
  Filled 2020-11-29: qty 25

## 2020-11-29 MED ORDER — LORAZEPAM 2 MG/ML IJ SOLN: 1.0000 mg | INTRAMUSCULAR | Status: AC | PRN

## 2020-11-29 MED ORDER — DEXAMETHASONE SODIUM PHOSPHATE 10 MG/ML IJ SOLN
INTRAMUSCULAR | Status: DC | PRN
  Administered 2020-11-29: 10 mg via INTRAVENOUS

## 2020-11-29 MED ORDER — PROPOFOL 500 MG/50ML IV EMUL
INTRAVENOUS | Status: AC
  Filled 2020-11-29: qty 50

## 2020-11-29 MED ORDER — LIDOCAINE HCL (CARDIAC) PF 100 MG/5ML IV SOSY
PREFILLED_SYRINGE | INTRAVENOUS | Status: DC | PRN
  Administered 2020-11-29: 100 mg via INTRAVENOUS

## 2020-11-29 MED ORDER — ACETAMINOPHEN 325 MG PO TABS: 650.0000 mg | ORAL_TABLET | Freq: Four times a day (QID) | ORAL | Status: DC | PRN

## 2020-11-29 MED ORDER — LIDOCAINE HCL (PF) 2 % IJ SOLN
INTRAMUSCULAR | Status: AC
  Filled 2020-11-29: qty 5

## 2020-11-29 MED ORDER — SODIUM CHLORIDE 0.9 % IV SOLN
50.0000 ug/h | INTRAVENOUS | Status: DC
  Filled 2020-11-29 (×2): qty 1

## 2020-11-29 MED ORDER — PANTOPRAZOLE SODIUM 40 MG IV SOLR
40.0000 mg | Freq: Once | INTRAVENOUS | Status: AC
  Administered 2020-11-29: 40 mg via INTRAVENOUS
  Filled 2020-11-29: qty 40

## 2020-11-29 MED ORDER — SUCCINYLCHOLINE CHLORIDE 200 MG/10ML IV SOSY
PREFILLED_SYRINGE | INTRAVENOUS | Status: AC
  Filled 2020-11-29: qty 10

## 2020-11-29 MED ORDER — DEXAMETHASONE SODIUM PHOSPHATE 10 MG/ML IJ SOLN
INTRAMUSCULAR | Status: AC
  Filled 2020-11-29: qty 1

## 2020-11-29 MED ORDER — ACETAMINOPHEN 650 MG RE SUPP: 650.0000 mg | Freq: Four times a day (QID) | RECTAL | Status: DC | PRN

## 2020-11-29 MED ORDER — FERROUS SULFATE 325 (65 FE) MG PO TABS
325.0000 mg | ORAL_TABLET | Freq: Two times a day (BID) | ORAL | Status: DC
Start: 2020-11-30 — End: 2020-12-04
  Administered 2020-11-30 – 2020-12-04 (×7): 325 mg via ORAL
  Filled 2020-11-29 (×8): qty 1

## 2020-11-29 MED ORDER — LABETALOL HCL 5 MG/ML IV SOLN
INTRAVENOUS | Status: AC
  Filled 2020-11-29: qty 4

## 2020-11-29 MED ORDER — ALBUTEROL SULFATE HFA 108 (90 BASE) MCG/ACT IN AERS
1.0000 | INHALATION_SPRAY | Freq: Four times a day (QID) | RESPIRATORY_TRACT | Status: DC
  Filled 2020-11-29: qty 6.7

## 2020-11-29 MED ORDER — DEXTROSE-NACL 5-0.45 % IV SOLN: INTRAVENOUS | Status: AC

## 2020-11-29 MED ORDER — SODIUM CHLORIDE 0.9 % IV SOLN
510.0000 mg | Freq: Once | INTRAVENOUS | Status: AC
  Administered 2020-11-29: 510 mg via INTRAVENOUS
  Filled 2020-11-29: qty 17

## 2020-11-29 NOTE — Plan of Care (Signed)
Patient has had three large black liquid stools. He is also very weak and dizzy when he gets up to the bedside commode. Currently receiving second unit of blood.  Will continue to monitor.  Arturo Morton

## 2020-11-29 NOTE — Interval H&P Note (Signed)
History and Physical Interval Note:  11/29/2020 1:04 PM  John Escobar  has presented today for surgery, with the diagnosis of UGI bleed.  The various methods of treatment have been discussed with the patient and family. After consideration of risks, benefits and other options for treatment, the patient has consented to  Procedure(s): ESOPHAGOGASTRODUODENOSCOPY (EGD) WITH PROPOFOL (N/A) as a surgical intervention.  The patient's history has been reviewed, patient examined, no change in status, stable for surgery.  I have reviewed the patient's chart and labs.  Questions were answered to the patient's satisfaction.     [name], [name]

## 2020-11-29 NOTE — Progress Notes (Signed)
Dr. Para March was notified and GI consult was placed. Will update vitals.  John Escobar  11/29/2020  6:24 AM

## 2020-11-29 NOTE — Transfer of Care (Signed)
Immediate Anesthesia Transfer of Care Note  Patient: John Escobar  Procedure(s) Performed: ESOPHAGOGASTRODUODENOSCOPY (EGD) WITH PROPOFOL (N/A )  Patient Location: PACU Endo Suite  Anesthesia Type:General  Level of Consciousness: awake, alert  and oriented  Airway & Oxygen Therapy: Patient Spontanous Breathing  Post-op Assessment: Report given to RN and Post -op Vital signs reviewed and stable  Post vital signs: Reviewed and stable  Last Vitals:  Vitals Value Taken Time  BP 116/90 11/29/20 1410  Temp    Pulse 91 11/29/20 1410  Resp 20 11/29/20 1410  SpO2 99 % 11/29/20 1410    Last Pain:  Vitals:   11/29/20 0917  TempSrc:   PainSc: 0-No pain         Complications: No complications documented.

## 2020-11-29 NOTE — Anesthesia Procedure Notes (Signed)
Procedure Name: Intubation Date/Time: 11/29/2020 1:10 PM Performed by: Ginger Carne, CRNA Pre-anesthesia Checklist: Patient identified, Emergency Drugs available, Suction available, Patient being monitored and Timeout performed Patient Re-evaluated:Patient Re-evaluated prior to induction Oxygen Delivery Method: Circle system utilized Preoxygenation: Pre-oxygenation with 100% oxygen Induction Type: IV induction, Cricoid Pressure applied and Rapid sequence Laryngoscope Size: McGraph and 3 Grade View: Grade I Tube type: Oral Tube size: 7.0 mm Number of attempts: 1 Airway Equipment and Method: Stylet,  Video-laryngoscopy and Oral airway Placement Confirmation: ETT inserted through vocal cords under direct vision,  positive ETCO2 and breath sounds checked- equal and bilateral Secured at: 20 cm Tube secured with: Tape Dental Injury: Teeth and Oropharynx as per pre-operative assessment  Difficulty Due To: Difficulty was anticipated and Difficult Airway- due to dentition

## 2020-11-29 NOTE — Progress Notes (Signed)
Called to room per patient request. Found patient leaning over to side, stating that he was throwing up.  Bloody emesis noted on bed and in emesis bag; Care nurse, Lennon Alstrom, notified; zofran IVP given per MD order. Skin pale. Negative pressure room maintained for Covid+. Windy Carina, RN; 6:06 AM; 11/29/2020

## 2020-11-29 NOTE — Anesthesia Postprocedure Evaluation (Signed)
Anesthesia Post Note  Patient: John Escobar  Procedure(s) Performed: ESOPHAGOGASTRODUODENOSCOPY (EGD) WITH PROPOFOL (N/A )  Patient location during evaluation: Endoscopy Anesthesia Type: General Level of consciousness: awake Pain management: pain level controlled Vital Signs Assessment: post-procedure vital signs reviewed and stable Respiratory status: spontaneous breathing Cardiovascular status: stable Postop Assessment: no apparent nausea or vomiting Anesthetic complications: no   No complications documented.   Last Vitals:  Vitals:   11/29/20 1402 11/29/20 1410  BP: 105/76 116/90  Pulse: 92 91  Resp: 20 20  Temp:    SpO2: 100% 99%    Last Pain:  Vitals:   11/29/20 0917  TempSrc:   PainSc: 0-No pain                 Emilio Math

## 2020-11-29 NOTE — H&P (View-Only) (Signed)
[facility] GI Inpatient Consult Note   John Escobar, M.D.  Reason for Consult: Profound anemia   Attending Requesting Consult: Lindajo Royal, M.D.   History of Present Illness: John Escobar is a 65 y.o. male admitted last night for 6 some symptoms of malaise and an episode of syncope at home.  Patient reportedly told the emergency room physician he had had fever and some weakness.  He denied any abdominal pain or bleeding at admission.  GI was consulted for a profound anemia with hemoglobin of 5.6. This morning, I came to see the patient before entering the room encountered the CMA who said the patient had a large black bowel movement.  I walked in the room and saw the patient who is in no significant distress but complained of abdominal distention and bloating.  He was complaining of some rib pain as well.  Obtaining history, the patient reports he had an EGD and colonoscopy about 20 years ago in [place] at [facility].  He has a poor memory for remote medical events, but believes his colonoscopy was normal.  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  No bleeding issues were noted at that time. 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  He also has "severe degenerative joint disease".  He takes Pepto-Bismol at home for intermittent episodes of "stomach burning".  He continues to have some dysphagia to water and some solid food and has to spit that up on occasion but infrequently.  The patient has been taking Pepto-Bismol intermittently over the last several days for "diarrhea".  His stools were dark while taking Pepto-Bismol and is not known with the bleeding was occurring prior to hospitalization.  Patient does drink a fair amount of beer 3-4 daily but says not usually more than this.  It appears he lives with his daughter who brought him to the emergency room last  evening after witnessing the syncopal episode. The patient currently denies any history or current complaints of chest pain dyspnea at rest or peripheral's leg swelling.  Patient denies any hematemesis.  He does not take proton pump inhibitors or H2 blockers at home.  "I just do not trust those medications", patient says.  Past Medical History:  Past Medical History:  Diagnosis Date  . ETOH abuse     Problem List: Patient Active Problem List   Diagnosis Date Noted  . Acute anemia 11/28/2020  . Generalized weakness 11/28/2020  . Elevated serum creatinine 11/28/2020  . COVID-19 virus infection 11/28/2020  . Lactic acidosis 11/28/2020  . Severe sepsis (HCC) 11/28/2020  . Chronic alcohol abuse 11/28/2020    Past Surgical History: Past Surgical History:  Procedure Laterality Date  . ELBOW BURSA SURGERY    . SHOULDER SURGERY Left     Allergies: No Known Allergies  Home Medications: No medications prior to admission.   Home medication reconciliation was completed with the patient.   Scheduled Inpatient Medications:   . albuterol  1-2 puff Inhalation Q6H  . [START ON 11/30/2020] ferrous sulfate  325 mg Oral BID WC  . folic acid  1 mg Oral Daily  . multivitamin with minerals  1 tablet Oral Daily  . pantoprazole (PROTONIX) IV  40 mg Intravenous Q12H  . thiamine  100 mg Oral Daily    Continuous Inpatient Infusions:   . dextrose 5 % and 0.45% NaCl    .  ferumoxytol    . remdesivir 100 mg in NS 100 mL      PRN Inpatient Medications:  acetaminophen **OR** acetaminophen, acetaminophen, albuterol, dextromethorphan-guaiFENesin, LORazepam **OR** LORazepam, ondansetron (ZOFRAN) IV, senna-docusate  Family History: family history is not on file.   GI Family History: Negative  Social History:   reports that he has never smoked. He has never used smokeless tobacco. He reports current alcohol use. He reports previous drug use. The patient denies ETOH, tobacco, or drug use.     Review of Systems: Review of Systems - General ROS: positive for  - fatigue negative for - sleep disturbance, weight gain or weight loss Psychological ROS: negative Ophthalmic ROS: negative ENT ROS: negative Allergy and Immunology ROS: negative Hematological and Lymphatic ROS: positive for - fatigue negative for - jaundice, swollen lymph nodes or weight loss Endocrine ROS: negative Respiratory ROS: positive for - pleuritic pain and DOE negative for - hemoptysis, tachypnea or wheezing Cardiovascular ROS: positive for - dyspnea on exertion negative for - chest pain or palpitations Genito-Urinary ROS: no dysuria, trouble voiding, or hematuria Musculoskeletal ROS: positive for - joint pain, joint stiffness and joint swelling negative for - swelling in back - neither and leg - both Neurological ROS: no TIA or stroke symptoms Dermatological ROS: negative  Physical Examination: BP (!) 125/93 (BP Location: Right Arm)   Pulse (!) 109   Temp 97.9 F (36.6 C) (Axillary)   Resp 16   Ht 6\' 1"  (1.854 m)   Wt 63.2 kg   SpO2 100%   BMI 18.38 kg/m  Physical Exam Constitutional:      General: He is not in acute distress.    Appearance: He is ill-appearing. He is not diaphoretic.  HENT:     Head: Normocephalic and atraumatic.     Nose: Nose normal.     Mouth/Throat:     Mouth: Mucous membranes are dry.     Pharynx: No oropharyngeal exudate or posterior oropharyngeal erythema.  Eyes:     General: No scleral icterus.    Pupils: Pupils are equal, round, and reactive to light.  Cardiovascular:     Rate and Rhythm: Tachycardia present.     Pulses: Normal pulses.  Pulmonary:     Effort: Pulmonary effort is normal. No respiratory distress.     Breath sounds: No stridor. No rhonchi.  Chest:     Chest wall: No tenderness.  Abdominal:     General: There is no distension.     Palpations: Abdomen is soft.     Tenderness: There is abdominal tenderness. There is no guarding or rebound.   Musculoskeletal:        General: Swelling, tenderness and deformity present.     Right upper arm: Bony tenderness present.     Left upper arm: Bony tenderness present.     Right elbow: Deformity present. Tenderness present.     Left elbow: Deformity present.     Right hand: Swelling, deformity and tenderness present.     Left hand: Swelling, deformity and tenderness present.     Cervical back: Normal range of motion.  Skin:    Coloration: Skin is pale. Skin is not jaundiced.     Findings: No erythema or rash.  Neurological:     General: No focal deficit present.     Mental Status: He is alert.  Psychiatric:        Mood and Affect: Mood normal.     Data: Lab Results  Component Value Date  WBC 9.3 11/29/2020   HGB 7.1 (L) 11/29/2020   HCT 22.5 (L) 11/29/2020   MCV 86.2 11/29/2020   PLT 171 11/29/2020   Recent Labs  Lab 11/28/20 1518 11/29/20 0052 11/29/20 0427  HGB 5.6* 5.7* 7.1*   Lab Results  Component Value Date   NA 144 11/29/2020   K 4.6 11/29/2020   CL 116 (H) 11/29/2020   CO2 19 (L) 11/29/2020   BUN 72 (H) 11/29/2020   CREATININE 1.78 (H) 11/29/2020   Lab Results  Component Value Date   ALT 8 11/29/2020   AST 13 (L) 11/29/2020   ALKPHOS 35 (L) 11/29/2020   BILITOT 0.3 11/29/2020   Recent Labs  Lab 11/29/20 0427  INR 1.2   CBC Latest Ref Rng & Units 11/29/2020 11/29/2020 11/28/2020  WBC 4.0 - 10.5 K/uL 9.3 - 11.9(H)  Hemoglobin 13.0 - 17.0 g/dL 7.1(L) 5.7(L) 5.6(L)  Hematocrit 39.0 - 52.0 % 22.5(L) 19.3(L) 19.8(L)  Platelets 150 - 400 K/uL 171 - 247    STUDIES: DG Chest Port 1 View  Result Date: 11/28/2020 CLINICAL DATA:  Shortness of breath.  COVID positive. EXAM: PORTABLE CHEST 1 VIEW COMPARISON:  None. FINDINGS: Lungs symmetrically inflated. Minimal vague opacity in the lung bases. No confluent consolidation. No pneumothorax or pneumomediastinum. Heart is normal in size. Normal mediastinal contours allowing for rotation. No pleural fluid or  pulmonary edema. No acute osseous abnormalities are seen. 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 IMPRESSION: Minimal vague opacity at the lung bases, may be atelectasis or COVID-19 pneumonia. Electronically Signed   By: Narda Rutherford M.D.   On: 11/28/2020 21:48   @IMAGES @  Assessment:  1. Anemia, post hemorrhagic - Likely UGI source given melenic appearance of stool and long history of NSAID abuse.  2. Dysphagia - possibly related to GERD related esophageal stricture, with history of remote esophageal dilation circa 2000 in [place], [place]. [place]. 3. COVID-10 infection - New diagnosis. Patient stable from a cardiopulmonary standpoint. Dyspnea likely secondary anemia. 4. Reported hemetemesis by night nurse  - Not obtained on history. More worrisome for active GI bleeding. 5. Alcohol abuse - possible chronic liver disease/cirrhosis. Low Albumin of 2.6 supports cirrhosis dx, and but no elevation of INR at 1.2. Platelets low nl at 171.  COVID-19 status: Tested positive      Recommendations: 1. IV protonix gtt. 2. Two large bore IV's. 3. IV sandostatin gtt. 4. NPO. 5. Serial H/H. Transfuse gently. 6. IV fluids as tolerated at high rate.  7.  Urgent EGD today if feasible. The patient understands the nature of the planned procedure, indications, risks, alternatives and potential complications including but not limited to bleeding, infection, perforation, damage to internal organs and possible oversedation/side effects from anesthesia. The patient agrees and gives consent to proceed. Discuss with anesthesia regarding COVID + Status. Please refer to procedure notes for findings, recommendations and patient disposition/instructions.    Thank you for the consult. Please call with questions or concerns.  Sharlot Gowda, "Rosina Lowenstein MD [facility] [address] [place], [place] [phone]  11/29/2020 8:49 AM

## 2020-11-29 NOTE — Progress Notes (Signed)
PROGRESS NOTE    John Escobar  EUM:[id number] DOB: 12-03-1955 DOA: 11/28/2020 PCP: No primary care provider on file.   Brief Narrative:  65 year old with chronic alcohol use comes to the hospital after sustaining a fall at home with dizziness.  4 years ago his baseline hemoglobin was 13.8.  He does drink regularly.  Upon admission his hemoglobin was noted to be 5.6.  Patient initially refused rectal exam.  He was also found to be COVID-19 positive   Assessment & Plan:   Principal Problem:   Acute anemia Active Problems:   Generalized weakness   Elevated serum creatinine   COVID-19 virus infection   Lactic acidosis   Severe sepsis (HCC)   Chronic alcohol abuse  Acute anemia Severe iron deficiency anemia -Admission hemoglobin 5.6.  Baseline hemoglobin 13.8 from 2017.  No obvious evidence of blood loss, refused DRE upon admission.  Could be GI bleed from various causes especially in the setting of alcohol use -PPI Drip. Octreotide gtt. Will make him NPO -Status post 2 units PRBC transfusion -GI consulted -Start IV iron thereafter should be able to switch to p.o. when tolerating orals  Multiple falls with generalized weakness -Secondary to alcohol intoxication?.  He will require PT/OT -TSH-normal -UDS-negative  Sepsis secondary to mild acute respiratory distress COVID-19 infection -Sepsis evidenced by leukocytosis, tachycardia and lactic acidosis. -Reported of subjective fevers chills and shortness of breath at home. -Chest x-ray-Mild bil Infiltrates vs atelactesis.  -Incentive spirometer, flutter valve, bronchodilators -Remdesivir day 2 -Patient is not hypoxic therefore hold off on steroids -Procalcitonin- 0.16 -BNP-  Acute kidney injury -Baseline creatinine 1.0.  Admission creatinine 1.75. IVF -Alcohol withdrawal protocol -Thiamine, multivitamin, folic acid  Chronic alcohol use -Advised to quit drinking alcohol. CIWA added. Thiamine, folate and MV   DVT  prophylaxis: SCDs Code Status: Full  Family Communication:    Status is: Inpatient  Remains inpatient appropriate because:Inpatient level of care appropriate due to severity of illness   Dispo: The patient is from: Home              Anticipated d/c is to: SNF              Anticipated d/c date is: > 3 days              Patient currently is not medically stable to d/c.   Difficult to place patient No   Body mass index is 18.38 kg/m.    Subjective: Laying in the bed frustated and wants to be cleaned. Denies any abd pain unless if pressed, he is declining abd exam.   Review of Systems Otherwise negative except as per HPI, including: General: Denies fever, chills, night sweats or unintended weight loss. Resp: Denies cough, wheezing, shortness of breath. Cardiac: Denies chest pain, palpitations, orthopnea, paroxysmal nocturnal dyspnea. GI: Denies  diarrhea or constipation GU: Denies dysuria, frequency, hesitancy or incontinence MS: Denies muscle aches, joint pain or swelling Neuro: Denies headache, neurologic deficits (focal weakness, numbness, tingling), abnormal gait Psych: Denies anxiety, depression, SI/HI/AVH Skin: Denies new rashes or lesions ID: Denies sick contacts, exotic exposures, travel  Examination:  General exam: Appears calm and comfortable . Appears Pale Respiratory system: Clear to auscultation. Respiratory effort normal. Cardiovascular system: S1 & S2 heard, RRR. No JVD, murmurs, rubs, gallops or clicks. No pedal edema. Gastrointestinal system: declined abd exam Central nervous system: Alert and oriented. No focal neurological deficits. Extremities: Symmetric 5 x 5 power. Skin: No rashes, lesions or ulcers Psychiatry: Judgement and insight  appear normal. Mood & affect appropriate.     Objective: Vitals:   11/29/20 0056 11/29/20 0325 11/29/20 0500 11/29/20 0642  BP: (!) 140/98 122/85  (!) 125/93  Pulse: (!) 104 88  (!) 109  Resp: 16 16  16   Temp: 97.9  F (36.6 C) 98 F (36.7 C)  97.9 F (36.6 C)  TempSrc: Oral Axillary  Axillary  SpO2: 100% 100%  100%  Weight:   63.2 kg   Height:        Intake/Output Summary (Last 24 hours) at 11/29/2020 0838 Last data filed at 11/29/2020 0645 Gross per 24 hour  Intake 1690 ml  Output 200 ml  Net 1490 ml   Filed Weights   11/28/20 1511 11/29/20 0500  Weight: 85 kg 63.2 kg     Data Reviewed:   CBC: Recent Labs  Lab 11/28/20 1518 11/29/20 0052 11/29/20 0427  WBC 11.9*  --  9.3  NEUTROABS 10.3*  --  7.5  HGB 5.6* 5.7* 7.1*  HCT 19.8* 19.3* 22.5*  MCV 85.7  --  86.2  PLT 247  --  171   Basic Metabolic Panel: Recent Labs  Lab 11/28/20 1518 11/29/20 0052 11/29/20 0427  NA 137  --  144  K 4.3  --  4.6  CL 112*  --  116*  CO2 15*  --  19*  GLUCOSE 196*  --  128*  BUN 60*  --  72*  CREATININE 1.75*  --  1.78*  CALCIUM 8.2*  --  8.5*  MG  --  2.2 2.1  PHOS  --   --  3.8   GFR: Estimated Creatinine Clearance: 37.5 mL/min (A) (by C-G formula based on SCr of 1.78 mg/dL (H)). Liver Function Tests: Recent Labs  Lab 11/28/20 1518 11/29/20 0427  AST 17 13*  ALT 7 8  ALKPHOS 40 35*  BILITOT 0.5 0.3  PROT 6.1* 5.5*  ALBUMIN 2.8* 2.6*   Recent Labs  Lab 11/28/20 1518  LIPASE 33   No results for input(s): AMMONIA in the last 168 hours. Coagulation Profile: Recent Labs  Lab 11/29/20 0052 11/29/20 0427  INR 1.2 1.2   Cardiac Enzymes: No results for input(s): CKTOTAL, CKMB, CKMBINDEX, TROPONINI in the last 168 hours. BNP (last 3 results) No results for input(s): PROBNP in the last 8760 hours. HbA1C: No results for input(s): HGBA1C in the last 72 hours. CBG: No results for input(s): GLUCAP in the last 168 hours. Lipid Profile: No results for input(s): CHOL, HDL, LDLCALC, TRIG, CHOLHDL, LDLDIRECT in the last 72 hours. Thyroid Function Tests: Recent Labs    11/29/20 0427  TSH 2.668   Anemia Panel: Recent Labs    11/28/20 2111 11/29/20 0052 11/29/20 0427   FOLATE  --  9.7  --   FERRITIN  --  9* 9*  TIBC  --  358  --   IRON  --  16*  --   RETICCTPCT 3.8*  --   --    Sepsis Labs: Recent Labs  Lab 11/28/20 1551 11/28/20 1629  LATICACIDVEN 3.5* 1.9    Recent Results (from the past 240 hour(s))  Resp Panel by RT-PCR (Flu A&B, Covid) Nasopharyngeal Swab     Status: Abnormal   Collection Time: 11/28/20  3:18 PM   Specimen: Nasopharyngeal Swab; Nasopharyngeal(NP) swabs in vial transport medium  Result Value Ref Range Status   SARS Coronavirus 2 by RT PCR POSITIVE (A) NEGATIVE Final    Comment: CRITICAL RESULT CALLED TO, READ BACK  BY AND VERIFIED WITH: JOHN [facility] IVANDRUFF 11/28/20 1658 KLW (NOTE) SARS-CoV-2 target nucleic acids are DETECTED.  The SARS-CoV-2 RNA is generally detectable in upper respiratory specimens during the acute phase of infection. Positive results are indicative of the presence of the identified virus, but do not rule out bacterial infection or co-infection with other pathogens not detected by the test. Clinical correlation with patient history and other diagnostic information is necessary to determine patient infection status. The expected result is Negative.  Fact Sheet for Patients: BloggerCourse.comhttps://www.fda.gov/media/152166/download  Fact Sheet for Healthcare Providers: SeriousBroker.ithttps://www.fda.gov/media/152162/download  This test is not yet approved or cleared by the [place] FDA and  has been authorized for detection and/or diagnosis of SARS-CoV-2 by FDA under an Emergency Use Authorization (EUA).  This EUA will remain in effect (meaning this test  can be used) for the duration of  the COVID-19 declaration under Section 564(b)(1) of the Act, 21 U.S.C. section 360bbb-3(b)(1), unless the authorization is terminated or revoked sooner.     Influenza A by PCR NEGATIVE NEGATIVE Final   Influenza B by PCR NEGATIVE NEGATIVE Final    Comment: (NOTE) The Xpert Xpress SARS-CoV-2/FLU/RSV plus assay is intended as an aid in the  diagnosis of influenza from Nasopharyngeal swab specimens and should not be used as a sole basis for treatment. Nasal washings and aspirates are unacceptable for Xpert Xpress SARS-CoV-2/FLU/RSV testing.  Fact Sheet for Patients: BloggerCourse.comhttps://www.fda.gov/media/152166/download  Fact Sheet for Healthcare Providers: SeriousBroker.ithttps://www.fda.gov/media/152162/download  This test is not yet approved or cleared by the [place] FDA and has been authorized for detection and/or diagnosis of SARS-CoV-2 by FDA under an Emergency Use Authorization (EUA). This EUA will remain in effect (meaning this test can be used) for the duration of the COVID-19 declaration under Section 564(b)(1) of the Act, 21 U.S.C. section 360bbb-3(b)(1), unless the authorization is terminated or revoked.  Performed at [facility], [address] [place], [place] [address]   Culture, blood (Routine X 2) w Reflex to ID Panel     Status: None (Preliminary result)   Collection Time: 11/28/20  9:11 PM   Specimen: BLOOD  Result Value Ref Range Status   Specimen Description BLOOD BLOOD LEFT FOREARM  Final   Special Requests   Final    BOTTLES DRAWN AEROBIC AND ANAEROBIC Blood Culture results may not be optimal due to an inadequate volume of blood received in culture bottles   Culture   Final    NO GROWTH < 12 HOURS Performed at [facility], [address] [place], [place] [address]    Report Status PENDING  Incomplete  Culture, blood (Routine X 2) w Reflex to ID Panel     Status: None (Preliminary result)   Collection Time: 11/28/20  9:11 PM   Specimen: BLOOD  Result Value Ref Range Status   Specimen Description BLOOD BLOOD RIGHT HAND  Final   Special Requests   Final    BOTTLES DRAWN AEROBIC ONLY Blood Culture results may not be optimal due to an inadequate volume of blood received in culture bottles   Culture   Final    NO GROWTH < 12 HOURS Performed at [facility], [address]  [place], [place] [address]    Report Status PENDING  Incomplete         Radiology Studies: DG Chest Port 1 View  Result Date: 11/28/2020 CLINICAL DATA:  Shortness of breath.  COVID positive. EXAM: PORTABLE CHEST 1 VIEW COMPARISON:  None. FINDINGS: Lungs symmetrically inflated. Minimal  vague opacity in the lung bases. No confluent consolidation. No pneumothorax or pneumomediastinum. Heart is normal in size. Normal mediastinal contours allowing for rotation. No pleural fluid or pulmonary edema. No acute osseous abnormalities are seen. 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 IMPRESSION: Minimal vague opacity at the lung bases, may be atelectasis or COVID-19 pneumonia. Electronically Signed   By: Narda Rutherford M.D.   On: 11/28/2020 21:48        Scheduled Meds: . folic acid  1 mg Oral Daily  . multivitamin with minerals  1 tablet Oral Daily  . thiamine  100 mg Oral Daily   Continuous Infusions: . remdesivir 100 mg in NS 100 mL       LOS: 1 day   Time spent= 35 mins    Diona Peregoy Joline Maxcy, MD [facility]  If 7PM-7AM, please contact night-coverage  11/29/2020, 8:38 AM

## 2020-11-29 NOTE — Consult Note (Signed)
[facility] GI Inpatient Consult Note   [name], M.D.  Reason for Consult: Profound anemia   Attending Requesting Consult: Lindajo Royal, M.D.   History of Present Illness: John Escobar is a 65 y.o. male admitted last night for 6 some symptoms of malaise and an episode of syncope at home.  Patient reportedly told the emergency room physician he had had fever and some weakness.  He denied any abdominal pain or bleeding at admission.  GI was consulted for a profound anemia with hemoglobin of 5.6. This morning, I came to see the patient before entering the room encountered the CMA who said the patient had a large black bowel movement.  I walked in the room and saw the patient who is in no significant distress but complained of abdominal distention and bloating.  He was complaining of some rib pain as well.  Obtaining history, the patient reports he had an EGD and colonoscopy about 20 years ago in [place] at [facility].  He has a poor memory for remote medical events, but believes his colonoscopy was normal.  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  No bleeding issues were noted at that time. 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  He also has "severe degenerative joint disease".  He takes Pepto-Bismol at home for intermittent episodes of "stomach burning".  He continues to have some dysphagia to water and some solid food and has to spit that up on occasion but infrequently.  The patient has been taking Pepto-Bismol intermittently over the last several days for "diarrhea".  His stools were dark while taking Pepto-Bismol and is not known with the bleeding was occurring prior to hospitalization.  Patient does drink a fair amount of beer 3-4 daily but says not usually more than this.  It appears he lives with his daughter who brought him to the emergency room last  evening after witnessing the syncopal episode. The patient currently denies any history or current complaints of chest pain dyspnea at rest or peripheral's leg swelling.  Patient denies any hematemesis.  He does not take proton pump inhibitors or H2 blockers at home.  "I just do not trust those medications", patient says.  Past Medical History:  Past Medical History:  Diagnosis Date  . ETOH abuse     Problem List: Patient Active Problem List   Diagnosis Date Noted  . Acute anemia 11/28/2020  . Generalized weakness 11/28/2020  . Elevated serum creatinine 11/28/2020  . COVID-19 virus infection 11/28/2020  . Lactic acidosis 11/28/2020  . Severe sepsis (HCC) 11/28/2020  . Chronic alcohol abuse 11/28/2020    Past Surgical History: Past Surgical History:  Procedure Laterality Date  . ELBOW BURSA SURGERY    . SHOULDER SURGERY Left     Allergies: No Known Allergies  Home Medications: No medications prior to admission.   Home medication reconciliation was completed with the patient.   Scheduled Inpatient Medications:   . albuterol  1-2 puff Inhalation Q6H  . [START ON 11/30/2020] ferrous sulfate  325 mg Oral BID WC  . folic acid  1 mg Oral Daily  . multivitamin with minerals  1 tablet Oral Daily  . pantoprazole (PROTONIX) IV  40 mg Intravenous Q12H  . thiamine  100 mg Oral Daily    Continuous Inpatient Infusions:   . dextrose 5 % and 0.45% NaCl    .  ferumoxytol    . remdesivir 100 mg in NS 100 mL      PRN Inpatient Medications:  acetaminophen **OR** acetaminophen, acetaminophen, albuterol, dextromethorphan-guaiFENesin, LORazepam **OR** LORazepam, ondansetron (ZOFRAN) IV, senna-docusate  Family History: family history is not on file.   GI Family History: Negative  Social History:   reports that he has never smoked. He has never used smokeless tobacco. He reports current alcohol use. He reports previous drug use. The patient denies ETOH, tobacco, or drug use.     Review of Systems: Review of Systems - General ROS: positive for  - fatigue negative for - sleep disturbance, weight gain or weight loss Psychological ROS: negative Ophthalmic ROS: negative ENT ROS: negative Allergy and Immunology ROS: negative Hematological and Lymphatic ROS: positive for - fatigue negative for - jaundice, swollen lymph nodes or weight loss Endocrine ROS: negative Respiratory ROS: positive for - pleuritic pain and DOE negative for - hemoptysis, tachypnea or wheezing Cardiovascular ROS: positive for - dyspnea on exertion negative for - chest pain or palpitations Genito-Urinary ROS: no dysuria, trouble voiding, or hematuria Musculoskeletal ROS: positive for - joint pain, joint stiffness and joint swelling negative for - swelling in back - neither and leg - both Neurological ROS: no TIA or stroke symptoms Dermatological ROS: negative  Physical Examination: BP (!) 125/93 (BP Location: Right Arm)   Pulse (!) 109   Temp 97.9 F (36.6 C) (Axillary)   Resp 16   Ht 6\' 1"  (1.854 m)   Wt 63.2 kg   SpO2 100%   BMI 18.38 kg/m  Physical Exam Constitutional:      General: He is not in acute distress.    Appearance: He is ill-appearing. He is not diaphoretic.  HENT:     Head: Normocephalic and atraumatic.     Nose: Nose normal.     Mouth/Throat:     Mouth: Mucous membranes are dry.     Pharynx: No oropharyngeal exudate or posterior oropharyngeal erythema.  Eyes:     General: No scleral icterus.    Pupils: Pupils are equal, round, and reactive to light.  Cardiovascular:     Rate and Rhythm: Tachycardia present.     Pulses: Normal pulses.  Pulmonary:     Effort: Pulmonary effort is normal. No respiratory distress.     Breath sounds: No stridor. No rhonchi.  Chest:     Chest wall: No tenderness.  Abdominal:     General: There is no distension.     Palpations: Abdomen is soft.     Tenderness: There is abdominal tenderness. There is no guarding or rebound.   Musculoskeletal:        General: Swelling, tenderness and deformity present.     Right upper arm: Bony tenderness present.     Left upper arm: Bony tenderness present.     Right elbow: Deformity present. Tenderness present.     Left elbow: Deformity present.     Right hand: Swelling, deformity and tenderness present.     Left hand: Swelling, deformity and tenderness present.     Cervical back: Normal range of motion.  Skin:    Coloration: Skin is pale. Skin is not jaundiced.     Findings: No erythema or rash.  Neurological:     General: No focal deficit present.     Mental Status: He is alert.  Psychiatric:        Mood and Affect: Mood normal.     Data: Lab Results  Component Value Date  WBC 9.3 11/29/2020   HGB 7.1 (L) 11/29/2020   HCT 22.5 (L) 11/29/2020   MCV 86.2 11/29/2020   PLT 171 11/29/2020   Recent Labs  Lab 11/28/20 1518 11/29/20 0052 11/29/20 0427  HGB 5.6* 5.7* 7.1*   Lab Results  Component Value Date   NA 144 11/29/2020   K 4.6 11/29/2020   CL 116 (H) 11/29/2020   CO2 19 (L) 11/29/2020   BUN 72 (H) 11/29/2020   CREATININE 1.78 (H) 11/29/2020   Lab Results  Component Value Date   ALT 8 11/29/2020   AST 13 (L) 11/29/2020   ALKPHOS 35 (L) 11/29/2020   BILITOT 0.3 11/29/2020   Recent Labs  Lab 11/29/20 0427  INR 1.2   CBC Latest Ref Rng & Units 11/29/2020 11/29/2020 11/28/2020  WBC 4.0 - 10.5 K/uL 9.3 - 11.9(H)  Hemoglobin 13.0 - 17.0 g/dL 7.1(L) 5.7(L) 5.6(L)  Hematocrit 39.0 - 52.0 % 22.5(L) 19.3(L) 19.8(L)  Platelets 150 - 400 K/uL 171 - 247    STUDIES: DG Chest Port 1 View  Result Date: 11/28/2020 CLINICAL DATA:  Shortness of breath.  COVID positive. EXAM: PORTABLE CHEST 1 VIEW COMPARISON:  None. FINDINGS: Lungs symmetrically inflated. Minimal vague opacity in the lung bases. No confluent consolidation. No pneumothorax or pneumomediastinum. Heart is normal in size. Normal mediastinal contours allowing for rotation. No pleural fluid or  pulmonary edema. No acute osseous abnormalities are seen. 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 IMPRESSION: Minimal vague opacity at the lung bases, may be atelectasis or COVID-19 pneumonia. Electronically Signed   By: Melanie  Sanford M.D.   On: 11/28/2020 21:48   @IMAGES@  Assessment:  1. Anemia, post hemorrhagic - Likely UGI source given melenic appearance of stool and long history of NSAID abuse.  2. Dysphagia - possibly related to GERD related esophageal stricture, with history of remote esophageal dilation circa 2000 in [place], [place]. [facility]. 3. COVID-10 infection - New diagnosis. Patient stable from a cardiopulmonary standpoint. Dyspnea likely secondary anemia. 4. Reported hemetemesis by night nurse  - Not obtained on history. More worrisome for active GI bleeding. 5. Alcohol abuse - possible chronic liver disease/cirrhosis. Low Albumin of 2.6 supports cirrhosis dx, and but no elevation of INR at 1.2. Platelets low nl at 171.  COVID-19 status: Tested positive      Recommendations: 1. IV protonix gtt. 2. Two large bore IV's. 3. IV sandostatin gtt. 4. NPO. 5. Serial H/H. Transfuse gently. 6. IV fluids as tolerated at high rate.  7.  Urgent EGD today if feasible. The patient understands the nature of the planned procedure, indications, risks, alternatives and potential complications including but not limited to bleeding, infection, perforation, damage to internal organs and possible oversedation/side effects from anesthesia. The patient agrees and gives consent to proceed. Discuss with anesthesia regarding COVID + Status. Please refer to procedure notes for findings, recommendations and patient disposition/instructions.    Thank you for the consult. Please call with questions or concerns.  Pura Picinich, "Keith" MD [facility] [address] [place], [place] [address] [phone]  11/29/2020 8:49 AM     

## 2020-11-29 NOTE — Anesthesia Preprocedure Evaluation (Signed)
Anesthesia Evaluation  Patient identified by MRN, date of birth, ID band Patient awake    Reviewed: Allergy & Precautions, H&P , NPO status , Patient's Chart, lab work & pertinent test results  Airway Mallampati: III       Dental  (+) Poor Dentition   Pulmonary neg pulmonary ROS,    Pulmonary exam normal breath sounds clear to auscultation       Cardiovascular negative cardio ROS Normal cardiovascular exam Rhythm:Regular Rate:Normal     Neuro/Psych PSYCHIATRIC DISORDERS negative neurological ROS     GI/Hepatic negative GI ROS, Neg liver ROS,   Endo/Other  negative endocrine ROS  Renal/GU negative Renal ROS  negative genitourinary   Musculoskeletal negative musculoskeletal ROS (+)   Abdominal   Peds negative pediatric ROS (+)  Hematology negative hematology ROS (+)   Anesthesia Other Findings Past Medical History: No date: ETOH abuse   Reproductive/Obstetrics negative OB ROS                             Anesthesia Physical Anesthesia Plan  ASA: III  Anesthesia Plan: General   Post-op Pain Management:    Induction: Intravenous  PONV Risk Score and Plan: 2  Airway Management Planned: Oral ETT  Additional Equipment:   Intra-op Plan:   Post-operative Plan: Extubation in OR  Informed Consent: I have reviewed the patients History and Physical, chart, labs and discussed the procedure including the risks, benefits and alternatives for the proposed anesthesia with the patient or authorized representative who has indicated his/her understanding and acceptance.     Dental advisory given  Plan Discussed with: CRNA, Anesthesiologist and Surgeon  Anesthesia Plan Comments:         Anesthesia Quick Evaluation

## 2020-11-29 NOTE — Evaluation (Signed)
Physical Therapy Evaluation Patient Details Name: John Escobar MRN: [id number] DOB: 04-Mar-1956 Today's Date: 11/29/2020   History of Present Illness  65 y.o. male with medical history significant for chronic alcohol abuse, who is admitted to [facility] on 11/28/2020 with acute anemia after presenting from home post fall. C/o dizziness with 2 falls during ambulation, having dark liquid stools and vomiting blood.  Found to be Covid +.  Clinical Impression  Pt leads conversation with "What the heck do I need PT for?" and generally did show a lot of interest in doing much activity today.  He was willing to give me some background and allow limited testing of U&LEs but was clearly not going to try much mobility, with getting covers off to attempt what we could a tarry stool was noted and nursing called to assist with clean up.  Pt with HGB just 7.1 this after after 2 units blood.  OOB assessment will be more revealing when appropriate, but highly unlikely that he could safely return home and though he voices disinterest in going to rehab this is the only reasonable recommendation based on presentation today.       Follow Up Recommendations SNF (will need to further assess OOB, but highly unlikely he would be safe to go home)    Equipment Recommendations   (TBD at next venue of care)    Recommendations for Other Services       Precautions / Restrictions Precautions Precautions: Fall Restrictions Weight Bearing Restrictions: No      Mobility  Bed Mobility               General bed mobility comments: Pt frustrated with his situation and not wanting to do any mobility but seemed to agree to do a little.  When PT moved cover to attempt pt had large watery, black stool - deferred mobility this date and nursing called for assist with clean up    Transfers                    Ambulation/Gait                Stairs            Wheelchair  Mobility    Modified Rankin (Stroke Patients Only)       Balance                                             Pertinent Vitals/Pain Pain Assessment: Faces Faces Pain Scale: Hurts even more Pain Location: abdominal pain    Home Living Family/patient expects to be discharged to:: Private residence Living Arrangements: Children Available Help at Discharge: Available 24 hours/day (2 of 3 children work out of the home during the day, youngest apparently "sleeps all day") Type of Home: Apartment Home Access: Stairs to enter Entrance Stairs-Rails: Right Entrance Stairs-Number of Steps: 13 Home Layout: One level Home Equipment: Walker - 2 wheels;Cane - single point (reports he has not used them recently)      Prior Function Level of Independence: Independent         Comments: Pt reports that on good days he can walk 1/4 mile to the convenience store, bad days he can hardly get around in the home     Hand Dominance        Extremity/Trunk Assessment  Upper Extremity Assessment Upper Extremity Assessment: Generalized weakness (arthritic changes in essentially all joints, has limited but functional AROM and strength)    Lower Extremity Assessment Lower Extremity Assessment: Generalized weakness (arthritic changes in essentially all joints, has limited but functional AROM and strength)       Communication   Communication: No difficulties  Cognition Arousal/Alertness: Awake/alert Behavior During Therapy: Agitated Overall Cognitive Status: Within Functional Limits for tasks assessed                                 General Comments: Pt able to answer questions appropriately, but is clearly frustrated with situation and not forthcoming as conversation extends      General Comments General comments (skin integrity, edema, etc.): Unable to do much mobility, U&LE strength testing reveals decreased strength and ROM globally.  Pt is post 2  transfusions with HGB at 7.1 morning of PT exam.    Exercises     Assessment/Plan    PT Assessment Patient needs continued PT services  PT Problem List Decreased strength;Decreased range of motion;Decreased activity tolerance;Decreased balance;Decreased mobility;Decreased knowledge of use of DME;Decreased safety awareness;Pain       PT Treatment Interventions DME instruction;Gait training;Stair training;Functional mobility training;Therapeutic activities;Therapeutic exercise;Balance training;Patient/family education    PT Goals (Current goals can be found in the Care Plan section)  Acute Rehab PT Goals Patient Stated Goal: stop the dry heaves and diarrhea PT Goal Formulation: With patient Time For Goal Achievement: 12/12/20 Potential to Achieve Goals: Fair    Frequency Min 2X/week   Barriers to discharge        Co-evaluation               AM-PAC PT "6 Clicks" Mobility  Outcome Measure Help needed turning from your back to your side while in a flat bed without using bedrails?: Total Help needed moving from lying on your back to sitting on the side of a flat bed without using bedrails?: Total Help needed moving to and from a bed to a chair (including a wheelchair)?: Total Help needed standing up from a chair using your arms (e.g., wheelchair or bedside chair)?: Total Help needed to walk in hospital room?: Total Help needed climbing 3-5 steps with a railing? : Total 6 Click Score: 6    End of Session   Activity Tolerance: Patient limited by pain;Patient limited by fatigue Patient left: with bed alarm set;with call bell/phone within reach Nurse Communication: Mobility status (need for clean up) PT Visit Diagnosis: Difficulty in walking, not elsewhere classified (R26.2);History of falling (Z91.81);Muscle weakness (generalized) (M62.81)    Time: 9390-3009 PT Time Calculation (min) (ACUTE ONLY): 23 min   Charges:   PT Evaluation $PT Eval Low Complexity: 1 Low           Malachi Pro, DPT 11/29/2020, 11:32 AM

## 2020-11-29 NOTE — Op Note (Signed)
[facility] Gastroenterology Patient Name: John Escobar Procedure Date: 11/29/2020 12:51 PM MRN: [id number] Account #: [id number] Date of Birth: 1956/09/03 Admit Type: Inpatient Age: 65 Room: [facility] ENDO ROOM 3 Gender: Male Note Status: Finalized Procedure:             Upper GI endoscopy Indications:           Acute post hemorrhagic anemia, Hematemesis, Melena Providers:             Boykin Nearing. Norma Fredrickson MD, MD Referring MD:          No Local Md, MD (Referring MD) Medicines:             Propofol per Anesthesia Complications:         No immediate complications. Estimated blood loss:                         Minimal. Procedure:             Pre-Anesthesia Assessment:                        - The risks and benefits of the procedure and the                         sedation options and risks were discussed with the                         patient. All questions were answered and informed                         consent was obtained.                        - Patient identification and proposed procedure were                         verified prior to the procedure by the nurse. The                         procedure was verified in the procedure room.                        - ASA Grade Assessment: III - A patient with severe                         systemic disease.                        - After reviewing the risks and benefits, the patient                         was deemed in satisfactory condition to undergo the                         procedure.                        After obtaining informed consent, the endoscope was                         passed under direct vision.  Throughout the procedure,                         the patient's blood pressure, pulse, and oxygen                         saturations were monitored continuously. The Endoscope                         was introduced through the mouth, and advanced to the                         third part of duodenum. The  upper GI endoscopy was                         accomplished without difficulty. The patient tolerated                         the procedure well. Findings:      The examined esophagus was normal. Estimated blood loss: none.      A single 5 mm angioectasia with bleeding was found in the gastric body.       Coagulation for hemostasis using bipolar probe was successful. Estimated       blood loss was minimal.      Two 3 mm angioectasias with no bleeding were found in the gastric body.       Coagulation for hemostasis using bipolar probe was successful.      A 2 cm hiatal hernia was present.      One non-bleeding cratered gastric ulcer with no stigmata of bleeding was       found in the prepyloric region of the stomach. The lesion was 8 mm in       largest dimension.      Scattered moderate inflammation characterized by linear erosions was       found in the gastric antrum.      A few localized erosions without bleeding were found in the second       portion of the duodenum.      The first portion of the duodenum and third portion of the duodenum were       normal.      The exam was otherwise without abnormality. Impression:            - Normal esophagus.                        - A single bleeding angioectasia in the stomach.                         Treated with bipolar cautery.                        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                         Treated with bipolar cautery.                        - 2 cm hiatal hernia.                        -  Non-bleeding gastric ulcer with no stigmata of                         bleeding.                        - Gastritis.                        - Duodenal erosions without bleeding.                        - Normal first portion of the duodenum and third                         portion of the duodenum.                        - The examination was otherwise normal.                        - No specimens  collected. Recommendation:        - Return patient to hospital ward for ongoing care.                        - Clear liquid diet.                        - Continue IV protonix, d/c octreotide                        Serial H/H, telemetry                        Following along Procedure Code(s):     --- Professional ---                        [phone], Esophagogastroduodenoscopy, flexible,                         transoral; with control of bleeding, any method Diagnosis Code(s):     --- Professional ---                        K92.1, Melena (includes Hematochezia)                        K92.0, Hematemesis                        D62, Acute posthemorrhagic anemia                        K26.9, Duodenal ulcer, unspecified as acute or                         chronic, without hemorrhage or perforation                        K29.70, Gastritis, unspecified, without bleeding                        K25.9, Gastric ulcer, unspecified as acute or chronic,  without hemorrhage or perforation                        K44.9, Diaphragmatic hernia without obstruction or                         gangrene                        K31.811, Angiodysplasia of stomach and duodenum with                         bleeding CPT copyright 2019 American Medical Association. All rights reserved. The codes documented in this report are preliminary and upon coder review may  be revised to meet current compliance requirements. Stanton Kidney MD, MD 11/29/2020 1:39:35 PM This report has been signed electronically. Number of Addenda: 0 Note Initiated On: 11/29/2020 12:51 PM Estimated Blood Loss:  Estimated blood loss was minimal.      [facility]

## 2020-11-30 ENCOUNTER — Encounter: Payer: Self-pay | Admitting: Internal Medicine

## 2020-11-30 LAB — HEMOGLOBIN AND HEMATOCRIT, BLOOD
HCT: 25.2 % — ABNORMAL LOW (ref 39.0–52.0)
Hemoglobin: 8.1 g/dL — ABNORMAL LOW (ref 13.0–17.0)

## 2020-11-30 LAB — CBC
HCT: 16 % — ABNORMAL LOW (ref 39.0–52.0)
Hemoglobin: 5 g/dL — ABNORMAL LOW (ref 13.0–17.0)
MCH: 27.6 pg (ref 26.0–34.0)
MCHC: 31.3 g/dL (ref 30.0–36.0)
MCV: 88.4 fL (ref 80.0–100.0)
Platelets: 155 10*3/uL (ref 150–400)
RBC: 1.81 MIL/uL — ABNORMAL LOW (ref 4.22–5.81)
RDW: 18.4 % — ABNORMAL HIGH (ref 11.5–15.5)
WBC: 9.4 10*3/uL (ref 4.0–10.5)
nRBC: 0.2 % (ref 0.0–0.2)

## 2020-11-30 LAB — BASIC METABOLIC PANEL
Anion gap: 8 (ref 5–15)
BUN: 66 mg/dL — ABNORMAL HIGH (ref 8–23)
CO2: 17 mmol/L — ABNORMAL LOW (ref 22–32)
Calcium: 8.5 mg/dL — ABNORMAL LOW (ref 8.9–10.3)
Chloride: 115 mmol/L — ABNORMAL HIGH (ref 98–111)
Creatinine, Ser: 1.86 mg/dL — ABNORMAL HIGH (ref 0.61–1.24)
GFR, Estimated: 40 mL/min — ABNORMAL LOW (ref >60)
Glucose, Bld: 147 mg/dL — ABNORMAL HIGH (ref 70–99)
Potassium: 4.3 mmol/L (ref 3.5–5.1)
Sodium: 140 mmol/L (ref 135–145)

## 2020-11-30 LAB — BRAIN NATRIURETIC PEPTIDE: B Natriuretic Peptide: 190.1 pg/mL — ABNORMAL HIGH (ref 0.0–100.0)

## 2020-11-30 LAB — CALCIUM, IONIZED: Calcium, Ionized, Serum: 4.9 mg/dL (ref 4.5–5.6)

## 2020-11-30 LAB — PREPARE RBC (CROSSMATCH)

## 2020-11-30 LAB — MAGNESIUM: Magnesium: 2 mg/dL (ref 1.7–2.4)

## 2020-11-30 MED ORDER — SODIUM CHLORIDE 0.9% IV SOLUTION: Freq: Once | INTRAVENOUS | Status: AC

## 2020-11-30 NOTE — Evaluation (Signed)
Occupational Therapy Evaluation Patient Details Name: John Escobar MRN: [id number] DOB: [birth year] Today's Date: 11/30/2020    History of Present Illness 65 y.o. male with medical history significant for chronic alcohol abuse, who is admitted to [facility] on 11/28/2020 with acute anemia after presenting from home post fall. C/o dizziness with 2 falls during ambulation, having dark liquid stools and vomiting blood.  Found to be Covid +.   Clinical Impression   Pt seen for OT evaluation this date in setting of acute hospitalization d/t COVID. Pt reports living with his 3 kids and that he is INDEP at baseline with no use of AD. Pt presents this date somewhat confused and openly frustrated with almost all aspects of care, all aspects of how he feels, and all aspects of therapy. However, pt also expresses frustration with "not being let out of the bed" which OT rebuts by reminding pt that she had offered to get him OOB for session. Pt does allow limited assessment initially revealing decreased activity tolerance and strength, but is ultimately only motivated to participate in treatment session as he would like to be "washed up". OT engages pt in seated UB bathing including washing his hair with MIN A. Pt requires MOD/MAX A with LB Bathing and dressing in standing with RW, sink-side as he does not have good standing tolerance or balance. Pt requires UE support as well as assist to complete actual task and does c/o slight dizziness, but endorses that it has much improved (pt received blood just few hours before OT session). Pt returned to sitting after ~20-30 secs and with MIN A and also requires MIN A to manage LEs for sit to sup transition. Pt left in bed with all needs met and in reach. As pt demos a functional decline versus his self-reported baseline of INDEP with no AD, will continue to follow him for OT acutely. In addition, anticipate he will require STR f/u therapy at SNF. Will  continue to monitor for progress.     Follow Up Recommendations  SNF    Equipment Recommendations  3 in 1 bedside commode;Tub/shower seat;Other (comment) (2ww)    Recommendations for Other Services       Precautions / Restrictions Precautions Precautions: Fall Restrictions Weight Bearing Restrictions: No      Mobility Bed Mobility Overal bed mobility: Needs Assistance Bed Mobility: Supine to Sit;Sit to Supine     Supine to sit: Min assist Sit to supine: Min assist   General bed mobility comments: Pt reluctant to participate in any OOB activity, first stating his stomach hurts, then his R arm, then his head and so on and so on. But within minutes, also expresses frustration that they won't "let me out of the bed". Pt does want to get washed up which is ultimately what sufices to get motivation and mobilization.    Transfers Overall transfer level: Needs assistance Equipment used: Rolling walker (2 wheeled) Transfers: Sit to/from Stand Sit to Stand: Min assist;From elevated surface         General transfer comment: increased time and cues for hand placement. Pt with limited standing tolerance, some slight c/o dizziness.    Balance Overall balance assessment: Needs assistance Sitting-balance support: Feet supported Sitting balance-Leahy Scale: Fair     Standing balance support: Bilateral upper extremity supported Standing balance-Leahy Scale: Fair Standing balance comment: B UE support, only tolerates ~20-30 second stand while OT assists MAX A with LB bathing and dressing  ADL either performed or assessed with clinical judgement   ADL Overall ADL's : Needs assistance/impaired                                       General ADL Comments: requires SETUP to MIN A for seated UB ADLs such as bathing, requires MOD to MAX A for LB ADLs such as bathing, dressing in standing at sink-side. Some slight c/o dizziness. Cues  for safety and sequencing.     Vision Patient Visual Report: No change from baseline       Perception     Praxis      Pertinent Vitals/Pain Pain Assessment: Faces Faces Pain Scale: Hurts even more Pain Location: abdominal pain Pain Descriptors / Indicators: Discomfort Pain Intervention(s): Limited activity within patient's tolerance;Monitored during session     Hand Dominance     Extremity/Trunk Assessment Upper Extremity Assessment Upper Extremity Assessment: Generalized weakness   Lower Extremity Assessment Lower Extremity Assessment: Generalized weakness       Communication Communication Communication: No difficulties   Cognition Arousal/Alertness: Awake/alert Behavior During Therapy: Agitated Overall Cognitive Status: No family/caregiver present to determine baseline cognitive functioning                                 General Comments: Pt able to answer PLOF and is oriented x3 (not to all of situation), but difficult to ascertain PLOF information. Pt often perseverates on what is wrong with his hospital stay or gives a reason he cannot do something despite stating he wants to. Ex: "They won't let me get out of bed", OT: " But sir, I've informed you I'm with therapy and I'm here to help get you ouf of bed."   General Comments       Exercises Other Exercises Other Exercises: OT educates re: role of OT and importance of OOB Activity with only MIN/MOD reception partially d/t pt motivation   Shoulder Instructions      Home Living Family/patient expects to be discharged to:: Private residence Living Arrangements: Children Available Help at Discharge: Available 24 hours/day (2 of 3 children work out of the home during the day, youngest apparently "sleeps all day") Type of Home: [place] to enter [employer] of Steps: 13 Entrance Stairs-Rails: Right Home Layout: One level               Home Equipment: [place] -  2 wheels;Cane - single point          Prior Functioning/Environment Level of Independence: Independent        Comments: Pt reports that on good days he can walk 1/4 mile to the convenience store, bad days he can hardly get around in the home        OT Problem List: Decreased strength;Decreased activity tolerance;Impaired balance (sitting and/or standing);Decreased knowledge of use of DME or AE;Pain      OT Treatment/Interventions: Self-care/ADL training;DME and/or AE instruction;Therapeutic activities;Balance training;Therapeutic exercise;Energy conservation;Patient/family education    OT Goals(Current goals can be found in the care plan section) Acute Rehab OT Goals Patient Stated Goal: to get my hair washed OT Goal Formulation: With patient Time For Goal Achievement: 12/14/20 Potential to Achieve Goals: Good ADL Goals Pt Will Perform Lower Body Bathing: with supervision;sit to/from stand (with LRAD PRN) Pt Will Perform Lower Body Dressing: with supervision;sit  to/from stand (with LRAD/AE PRN) Pt Will Transfer to Toilet: with supervision (with LRAD PRN) Pt Will Perform Toileting - Clothing Manipulation and hygiene: with supervision;sit to/from stand  OT Frequency: Min 1X/week   Barriers to D/C:            Co-evaluation              AM-PAC OT "6 Clicks" Daily Activity     Outcome Measure Help from another person eating meals?: None Help from another person taking care of personal grooming?: A Little Help from another person toileting, which includes using toliet, bedpan, or urinal?: A Lot Help from another person bathing (including washing, rinsing, drying)?: A Lot Help from another person to put on and taking off regular upper body clothing?: A Little Help from another person to put on and taking off regular lower body clothing?: A Lot 6 Click Score: 16   End of Session Equipment Utilized During Treatment: Gait belt;Rolling walker Nurse Communication: Mobility  status;Other (comment) (notified that R UE IV appears to be bleeding.)  Activity Tolerance: Patient tolerated treatment well Patient left: in bed;with call bell/phone within reach;with bed alarm set  OT Visit Diagnosis: Unsteadiness on feet (R26.81);Muscle weakness (generalized) (M62.81)                Time: 4742-5956 OT Time Calculation (min): 66 min Charges:  OT General Charges $OT Visit: 1 Visit OT Evaluation $OT Eval Moderate Complexity: 1 Mod OT Treatments $Self Care/Home Management : 23-37 mins $Therapeutic Activity: 23-37 mins  Gerrianne Scale, MS, OTR/L ascom [phone] 11/30/20, 7:21 PM

## 2020-11-30 NOTE — Progress Notes (Signed)
[facility] GI brief progress note  Patient has been uncooperative today with physicians and staff. No recurrent bleeding per day shift RN. Hgb increased to 11 from 5 with only 2uprbcs Afebrile VSS  Patient will need to continue PPI for minimum of 3 months and avoid all NSAIDs. Will order stool H Pylori.  Follow up with GI in 3-4 weeks.  George Ina, M.D. ABIM Diplomate in Gastroenterology [facility]

## 2020-11-30 NOTE — Progress Notes (Signed)
Spoke with Shirlyn Goltz the patient's son regarding plan of care and interventions

## 2020-11-30 NOTE — Progress Notes (Signed)
Patient's Hgb was 5.0. Notified Dr. Para March and appropriate orders were placed for a blood transfusion- 2 units.  John Escobar

## 2020-11-30 NOTE — Progress Notes (Signed)
PT Cancellation Note  Patient Details Name: John Escobar MRN: [id number] DOB: June 21, 1956   Cancelled Treatment:    Reason Eval/Treat Not Completed: Medical issues which prohibited therapy (Chart reviewed, most recent Hb: 5s. WIll deferred phys ther treatment to later date/time.)   [name] C 11/30/2020, 10:30 AM

## 2020-11-30 NOTE — Progress Notes (Signed)
PROGRESS NOTE    John Escobar  ZOX:[id number] DOB: 09-27-1956 DOA: 11/28/2020 PCP: No primary care provider on file.   Brief Narrative:  65 year old with chronic alcohol use comes to the hospital after sustaining a fall at home with dizziness.  4 years ago his baseline hemoglobin was 13.8.  He does drink regularly.  Upon admission his hemoglobin was noted to be 5.6.  Patient initially refused rectal exam.  He was also found to be COVID-19 positive.  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   Assessment & Plan:   Principal Problem:   Acute anemia Active Problems:   Generalized weakness   Elevated serum creatinine   COVID-19 virus infection   Lactic acidosis   Severe sepsis (HCC)   Chronic alcohol abuse  Acute anemia Severe iron deficiency anemia -Baseline hemoglobin 13.8 from 2017.  Admission hemoglobin around 5.0 thereafter received transfusion.  Hemoglobin this morning is 5.0 again, 2 units PRBC transfusion ordered -Continue PPI.  Diet as tolerated -Maintain hemoglobin greater than 7.0 -GI consulted -Start IV iron thereafter should be able to switch to p.o. when tolerating orals  Multiple falls with generalized weakness -Secondary to alcohol intoxication?.  He will require PT/OT -TSH-normal -UDS-negative  Sepsis secondary to mild acute respiratory distress COVID-19 infection -Sepsis evidenced by leukocytosis, tachycardia and lactic acidosis. -Reported of subjective fevers chills and shortness of breath at home. -Chest x-ray-Mild bil Infiltrates vs atelactesis.  -Incentive spirometer, flutter valve, bronchodilators -Remdesivir day 3 -Patient is not hypoxic therefore hold off on steroids -Procalcitonin- 0.16 -BNP-  Acute kidney injury -Baseline creatinine 1.0.  Admission creatinine 1.75.  Continue IV fluids.  Morning creatinine 1.8 -Alcohol withdrawal  protocol -Thiamine, multivitamin, folic acid  Chronic alcohol use -Advised to quit drinking alcohol. CIWA added. Thiamine, folate and MV   DVT prophylaxis: SCDs Code Status: Full  Family Communication:    Status is: Inpatient  Remains inpatient appropriate because:Inpatient level of care appropriate due to severity of illness   Dispo: The patient is from: Home              Anticipated d/c is to: SNF              Anticipated d/c date is: > 3 days              Patient currently is not medically stable to d/c.   Difficult to place patient No   Body mass index is 18.82 kg/m.    Subjective: Patient does not want to talk to me about his issues appropriately. Continues to blame on his problems but when I asked to use Incentive spirometer etc, he continues to decline.   Review of Systems Otherwise negative except as per HPI, including: Difficult to obtain as he doesn't want to participate in any conversation   Examination:  Declines his exam as well because he says "we are making him uncomfortable"  Objective: Vitals:   11/30/20 0302 11/30/20 0308 11/30/20 0647 11/30/20 0716  BP:  (!) 146/92 (!) 141/91 (!) 142/96  Pulse:  88 88 91  Resp:  16 16 12   Temp:  98 F (36.7 C) 98 F (36.7 C) 98.8 F (37.1 C)  TempSrc:  Oral Oral Oral  SpO2:  100% 100% 100%  Weight: 64.7 kg     Height:        Intake/Output Summary (Last 24 hours) at 11/30/2020 0835 Last data filed at 11/30/2020 [id number] Gross  per 24 hour  Intake 960.66 ml  Output 1340 ml  Net -379.34 ml   Filed Weights   11/28/20 1511 11/29/20 0500 11/30/20 0302  Weight: 85 kg 63.2 kg 64.7 kg     Data Reviewed:   CBC: Recent Labs  Lab 11/28/20 1518 11/29/20 0052 11/29/20 0427 11/29/20 2233 11/30/20 0400  WBC 11.9*  --  9.3  --  9.4  NEUTROABS 10.3*  --  7.5  --   --   HGB 5.6* 5.7* 7.1* 5.1* 5.0*  HCT 19.8* 19.3* 22.5* 16.6* 16.0*  MCV 85.7  --  86.2  --  88.4  PLT 247  --  171  --  155   Basic Metabolic  Panel: Recent Labs  Lab 11/28/20 1518 11/29/20 0052 11/29/20 0427 11/30/20 0400  NA 137  --  144 140  K 4.3  --  4.6 4.3  CL 112*  --  116* 115*  CO2 15*  --  19* 17*  GLUCOSE 196*  --  128* 147*  BUN 60*  --  72* 66*  CREATININE 1.75*  --  1.78* 1.86*  CALCIUM 8.2*  --  8.5* 8.5*  MG  --  2.2 2.1 2.0  PHOS  --   --  3.8  --    GFR: Estimated Creatinine Clearance: 36.7 mL/min (A) (by C-G formula based on SCr of 1.86 mg/dL (H)). Liver Function Tests: Recent Labs  Lab 11/28/20 1518 11/29/20 0427  AST 17 13*  ALT 7 8  ALKPHOS 40 35*  BILITOT 0.5 0.3  PROT 6.1* 5.5*  ALBUMIN 2.8* 2.6*   Recent Labs  Lab 11/28/20 1518  LIPASE 33   No results for input(s): AMMONIA in the last 168 hours. Coagulation Profile: Recent Labs  Lab 11/29/20 0052 11/29/20 0427  INR 1.2 1.2   Cardiac Enzymes: No results for input(s): CKTOTAL, CKMB, CKMBINDEX, TROPONINI in the last 168 hours. BNP (last 3 results) No results for input(s): PROBNP in the last 8760 hours. HbA1C: No results for input(s): HGBA1C in the last 72 hours. CBG: No results for input(s): GLUCAP in the last 168 hours. Lipid Profile: No results for input(s): CHOL, HDL, LDLCALC, TRIG, CHOLHDL, LDLDIRECT in the last 72 hours. Thyroid Function Tests: Recent Labs    11/29/20 0427  TSH 2.668   Anemia Panel: Recent Labs    11/28/20 2111 11/29/20 0052 11/29/20 0427  FOLATE  --  9.7  --   FERRITIN  --  9* 9*  TIBC  --  358  --   IRON  --  16*  --   RETICCTPCT 3.8*  --   --    Sepsis Labs: Recent Labs  Lab 11/28/20 1551 11/28/20 1629 11/29/20 0427  PROCALCITON  --   --  0.16  LATICACIDVEN 3.5* 1.9  --     Recent Results (from the past 240 hour(s))  Resp Panel by RT-PCR (Flu A&B, Covid) Nasopharyngeal Swab     Status: Abnormal   Collection Time: 11/28/20  3:18 PM   Specimen: Nasopharyngeal Swab; Nasopharyngeal(NP) swabs in vial transport medium  Result Value Ref Range Status   SARS Coronavirus 2 by RT PCR  POSITIVE (A) NEGATIVE Final    Comment: CRITICAL RESULT CALLED TO, READ BACK BY AND VERIFIED WITH: JOHN [facility] 11/28/20 1658 KLW (NOTE) SARS-CoV-2 target nucleic acids are DETECTED.  The SARS-CoV-2 RNA is generally detectable in upper respiratory specimens during the acute phase of infection. Positive results are indicative of the presence of the identified virus,  but do not rule out bacterial infection or co-infection with other pathogens not detected by the test. Clinical correlation with patient history and other diagnostic information is necessary to determine patient infection status. The expected result is Negative.  Fact Sheet for Patients: BloggerCourse.com  Fact Sheet for Healthcare Providers: SeriousBroker.it  This test is not yet approved or cleared by the [place] FDA and  has been authorized for detection and/or diagnosis of SARS-CoV-2 by FDA under an Emergency Use Authorization (EUA).  This EUA will remain in effect (meaning this test  can be used) for the duration of  the COVID-19 declaration under Section 564(b)(1) of the Act, 21 U.S.C. section 360bbb-3(b)(1), unless the authorization is terminated or revoked sooner.     Influenza A by PCR NEGATIVE NEGATIVE Final   Influenza B by PCR NEGATIVE NEGATIVE Final    Comment: (NOTE) The Xpert Xpress SARS-CoV-2/FLU/RSV plus assay is intended as an aid in the diagnosis of influenza from Nasopharyngeal swab specimens and should not be used as a sole basis for treatment. Nasal washings and aspirates are unacceptable for Xpert Xpress SARS-CoV-2/FLU/RSV testing.  Fact Sheet for Patients: BloggerCourse.com  Fact Sheet for Healthcare Providers: SeriousBroker.it  This test is not yet approved or cleared by the [place] FDA and has been authorized for detection and/or diagnosis of SARS-CoV-2 by FDA under an  Emergency Use Authorization (EUA). This EUA will remain in effect (meaning this test can be used) for the duration of the COVID-19 declaration under Section 564(b)(1) of the Act, 21 U.S.C. section 360bbb-3(b)(1), unless the authorization is terminated or revoked.  Performed at [facility], [address] [place], [place] [address]   Culture, blood (Routine X 2) w Reflex to ID Panel     Status: None (Preliminary result)   Collection Time: 11/28/20  9:11 PM   Specimen: BLOOD  Result Value Ref Range Status   Specimen Description BLOOD BLOOD LEFT FOREARM  Final   Special Requests   Final    BOTTLES DRAWN AEROBIC AND ANAEROBIC Blood Culture results may not be optimal due to an inadequate volume of blood received in culture bottles   Culture   Final    NO GROWTH 2 DAYS Performed at [facility], [address] [place], [place] [address]    Report Status PENDING  Incomplete  Culture, blood (Routine X 2) w Reflex to ID Panel     Status: None (Preliminary result)   Collection Time: 11/28/20  9:11 PM   Specimen: BLOOD  Result Value Ref Range Status   Specimen Description BLOOD BLOOD RIGHT HAND  Final   Special Requests   Final    BOTTLES DRAWN AEROBIC ONLY Blood Culture results may not be optimal due to an inadequate volume of blood received in culture bottles   Culture   Final    NO GROWTH 2 DAYS Performed at [facility], [address], [place], [place] [address]    Report Status PENDING  Incomplete         Radiology Studies: DG Chest Port 1 View  Result Date: 11/28/2020 CLINICAL DATA:  Shortness of breath.  COVID positive. EXAM: PORTABLE CHEST 1 VIEW COMPARISON:  None. FINDINGS: Lungs symmetrically inflated. Minimal vague opacity in the lung bases. No confluent consolidation. No pneumothorax or pneumomediastinum. Heart is normal in size. Normal mediastinal contours allowing for rotation. No pleural fluid or pulmonary edema. No acute osseous  abnormalities are seen. 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 IMPRESSION: Minimal  vague opacity at the lung bases, may be atelectasis or COVID-19 pneumonia. Electronically Signed   By: Narda Rutherford M.D.   On: 11/28/2020 21:48        Scheduled Meds: . albuterol  1-2 puff Inhalation Q6H  . ferrous sulfate  325 mg Oral BID WC  . folic acid  1 mg Oral Daily  . multivitamin with minerals  1 tablet Oral Daily  . thiamine  100 mg Oral Daily   Continuous Infusions: . dextrose 5 % and 0.45% NaCl 75 mL/hr at 11/30/20 0301  . pantoprozole (PROTONIX) infusion 8 mg/hr (11/30/20 0100)  . remdesivir 100 mg in NS 100 mL 200 mL/hr at 11/29/20 1225     LOS: 2 days   Time spent= 35 mins    Khaylee Mcevoy Joline Maxcy, MD [facility]  If 7PM-7AM, please contact night-coverage  11/30/2020, 8:35 AM

## 2020-12-01 LAB — BPAM RBC
Blood Product Expiration Date: 202203172359
Blood Product Expiration Date: 202203172359
Blood Product Expiration Date: 202203182359
Blood Product Expiration Date: 202203202359
ISSUE DATE / TIME: 202202161829
ISSUE DATE / TIME: 202202170026
ISSUE DATE / TIME: 202202180653
ISSUE DATE / TIME: 202202181036
Unit Type and Rh: 6200
Unit Type and Rh: 6200
Unit Type and Rh: 6200
Unit Type and Rh: 6200

## 2020-12-01 LAB — CBC
HCT: 23.1 % — ABNORMAL LOW (ref 39.0–52.0)
Hemoglobin: 7.5 g/dL — ABNORMAL LOW (ref 13.0–17.0)
MCH: 28.7 pg (ref 26.0–34.0)
MCHC: 32.5 g/dL (ref 30.0–36.0)
MCV: 88.5 fL (ref 80.0–100.0)
Platelets: 143 10*3/uL — ABNORMAL LOW (ref 150–400)
RBC: 2.61 MIL/uL — ABNORMAL LOW (ref 4.22–5.81)
RDW: 17.3 % — ABNORMAL HIGH (ref 11.5–15.5)
WBC: 8.4 10*3/uL (ref 4.0–10.5)
nRBC: 0.7 % — ABNORMAL HIGH (ref 0.0–0.2)

## 2020-12-01 LAB — BASIC METABOLIC PANEL
Anion gap: 9 (ref 5–15)
BUN: 45 mg/dL — ABNORMAL HIGH (ref 8–23)
CO2: 21 mmol/L — ABNORMAL LOW (ref 22–32)
Calcium: 8.6 mg/dL — ABNORMAL LOW (ref 8.9–10.3)
Chloride: 113 mmol/L — ABNORMAL HIGH (ref 98–111)
Creatinine, Ser: 1.73 mg/dL — ABNORMAL HIGH (ref 0.61–1.24)
GFR, Estimated: 44 mL/min — ABNORMAL LOW (ref >60)
Glucose, Bld: 104 mg/dL — ABNORMAL HIGH (ref 70–99)
Potassium: 3.9 mmol/L (ref 3.5–5.1)
Sodium: 143 mmol/L (ref 135–145)

## 2020-12-01 LAB — MAGNESIUM: Magnesium: 2.1 mg/dL (ref 1.7–2.4)

## 2020-12-01 LAB — TYPE AND SCREEN
ABO/RH(D): A POS
Antibody Screen: NEGATIVE
Unit division: 0
Unit division: 0
Unit division: 0
Unit division: 0

## 2020-12-01 NOTE — Progress Notes (Signed)
Telemetry called re: patient had 18 beats of SVT at 0448 and then went back into normal sinus. Hospitalist aware. Will continue to monitor. No orders received.

## 2020-12-01 NOTE — Progress Notes (Signed)
PROGRESS NOTE    John Escobar  IRC:[id number] DOB: 02-09-1956 DOA: 11/28/2020 PCP: No primary care provider on file.   Brief Narrative:  65 year old with chronic alcohol use comes to the hospital after sustaining a fall at home with dizziness.  4 years ago his baseline hemoglobin was 13.8.  He does drink regularly.  Upon admission his hemoglobin was noted to be 5.6.  Patient initially refused rectal exam.  He was also found to be COVID-19 positive.  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  Currently patient is on PPI, getting blood transfusion as needed.   Assessment & Plan:   Principal Problem:   Acute anemia Active Problems:   Generalized weakness   Elevated serum creatinine   COVID-19 virus infection   Lactic acidosis   Severe sepsis (HCC)   Chronic alcohol abuse  Acute anemia Severe iron deficiency anemia -Baseline hemoglobin 13.8 from 2017.  Admission hemoglobin around 5.0 thereafter received transfusion.   -Continue PPI drip. -Maintain hemoglobin greater than 7.0.  Transfuse as needed. -GI consulted, endoscopy showed hiatal hernia, nonbleeding gastric ulcer, gastritis and duodenal erosion. -On IV iron thereafter should be able to switch to p.o. when tolerating orals  Multiple falls with generalized weakness -Secondary to alcohol intoxication?.  He will require PT/OT -TSH-normal -UDS-negative  Sepsis secondary to mild acute respiratory distress COVID-19 infection -Sepsis evidenced by leukocytosis, tachycardia and lactic acidosis. -Reported of subjective fevers chills and shortness of breath at home. -Chest x-ray-Mild bil Infiltrates vs atelactesis.  -Incentive spirometer, flutter valve, bronchodilators -Remdesivir day 3/3; completed 2/18 -Patient is not hypoxic therefore hold off on steroids -Procalcitonin- 0.16 -BNP-190  Acute kidney injury -Baseline  creatinine 1.0.  Admission creatinine 1.75.  Continue IV fluids.  Morning creatinine 1.73 -Alcohol withdrawal protocol -Thiamine, multivitamin, folic acid  Chronic alcohol use -Advised to quit drinking alcohol. CIWA added. Thiamine, folate and MV   DVT prophylaxis: SCDs Code Status: Full  Family Communication:    Status is: Inpatient  Remains inpatient appropriate because:Inpatient level of care appropriate due to severity of illness   Dispo: The patient is from: Home              Anticipated d/c is to: SNF              Anticipated d/c date is: > 3 days              Patient currently is not medically stable to d/c.   Difficult to place patient No   Body mass index is 18.82 kg/m.    Subjective: Does not want to participate much in conversation.  He wants to be left alone and wants to go to sleep.  Does not have any complaints at this time.  He was able to tell me that he has not noticed any more bleeding from anywhere right now.  Review of Systems Otherwise negative except as per HPI, including: Difficult to obtain as he does not want to participate much.  Examination: Declining for physical exam.  Grossly does not appear to be in distress.  Objective: Vitals:   11/30/20 2117 12/01/20 0617 12/01/20 0850 12/01/20 1148  BP: 132/82 122/75 133/83 134/82  Pulse: 77 78 81 84  Resp: 20 20 18 18   Temp: 98.4 F (36.9 C) 98.6 F (37 C) 97.7 F (36.5 C) (!) 97.3 F (36.3 C)  TempSrc: Oral  Oral Oral  SpO2: 100% 99% 100% 100%  Weight:      Height:        Intake/Output Summary (Last 24 hours) at 12/01/2020 1206 Last data filed at 12/01/2020 1153 Gross per 24 hour  Intake 900.82 ml  Output 2495 ml  Net -1594.18 ml   Filed Weights   11/28/20 1511 11/29/20 0500 11/30/20 0302  Weight: 85 kg 63.2 kg 64.7 kg     Data Reviewed:   CBC: Recent Labs  Lab 11/28/20 1518 11/29/20 0052 11/29/20 0427 11/29/20 2233 11/30/20 0400 11/30/20 1550 12/01/20 0505  WBC 11.9*   --  9.3  --  9.4  --  8.4  NEUTROABS 10.3*  --  7.5  --   --   --   --   HGB 5.6*   < > 7.1* 5.1* 5.0* 8.1* 7.5*  HCT 19.8*   < > 22.5* 16.6* 16.0* 25.2* 23.1*  MCV 85.7  --  86.2  --  88.4  --  88.5  PLT 247  --  171  --  155  --  143*   < > = values in this interval not displayed.   Basic Metabolic Panel: Recent Labs  Lab 11/28/20 1518 11/29/20 0052 11/29/20 0427 11/30/20 0400 12/01/20 0505  NA 137  --  144 140 143  K 4.3  --  4.6 4.3 3.9  CL 112*  --  116* 115* 113*  CO2 15*  --  19* 17* 21*  GLUCOSE 196*  --  128* 147* 104*  BUN 60*  --  72* 66* 45*  CREATININE 1.75*  --  1.78* 1.86* 1.73*  CALCIUM 8.2*  --  8.5* 8.5* 8.6*  MG  --  2.2 2.1 2.0 2.1  PHOS  --   --  3.8  --   --    GFR: Estimated Creatinine Clearance: 39.5 mL/min (A) (by C-G formula based on SCr of 1.73 mg/dL (H)). Liver Function Tests: Recent Labs  Lab 11/28/20 1518 11/29/20 0427  AST 17 13*  ALT 7 8  ALKPHOS 40 35*  BILITOT 0.5 0.3  PROT 6.1* 5.5*  ALBUMIN 2.8* 2.6*   Recent Labs  Lab 11/28/20 1518  LIPASE 33   No results for input(s): AMMONIA in the last 168 hours. Coagulation Profile: Recent Labs  Lab 11/29/20 0052 11/29/20 0427  INR 1.2 1.2   Cardiac Enzymes: No results for input(s): CKTOTAL, CKMB, CKMBINDEX, TROPONINI in the last 168 hours. BNP (last 3 results) No results for input(s): PROBNP in the last 8760 hours. HbA1C: No results for input(s): HGBA1C in the last 72 hours. CBG: No results for input(s): GLUCAP in the last 168 hours. Lipid Profile: No results for input(s): CHOL, HDL, LDLCALC, TRIG, CHOLHDL, LDLDIRECT in the last 72 hours. Thyroid Function Tests: Recent Labs    11/29/20 0427  TSH 2.668   Anemia Panel: Recent Labs    11/28/20 2111 11/29/20 0052 11/29/20 0427  FOLATE  --  9.7  --   FERRITIN  --  9* 9*  TIBC  --  358  --   IRON  --  16*  --   RETICCTPCT 3.8*  --   --    Sepsis Labs: Recent Labs  Lab 11/28/20 1551 11/28/20 1629 11/29/20 0427   PROCALCITON  --   --  0.16  LATICACIDVEN 3.5* 1.9  --     Recent Results (from the past 240 hour(s))  Resp Panel by RT-PCR (Flu A&B, Covid) Nasopharyngeal Swab     Status: Abnormal   Collection Time: 11/28/20  3:18 PM   Specimen: Nasopharyngeal Swab; Nasopharyngeal(NP) swabs in vial transport medium  Result Value Ref Range Status   SARS Coronavirus 2 by RT PCR POSITIVE (A) NEGATIVE Final    Comment: CRITICAL RESULT CALLED TO, READ BACK BY AND VERIFIED WITH: JOHN VANDRUFF 11/28/20 1658 KLW (NOTE) SARS-CoV-2 target nucleic acids are DETECTED.  The SARS-CoV-2 RNA is generally detectable in upper respiratory specimens during the acute phase of infection. Positive results are indicative of the presence of the identified virus, but do not rule out bacterial infection or co-infection with other pathogens not detected by the test. Clinical correlation with patient history and other diagnostic information is necessary to determine patient infection status. The expected result is Negative.  Fact Sheet for Patients: BloggerCourse.com  Fact Sheet for Healthcare Providers: SeriousBroker.it  This test is not yet approved or cleared by the [place] FDA and  has been authorized for detection and/or diagnosis of SARS-CoV-2 by FDA under an Emergency Use Authorization (EUA).  This EUA will remain in effect (meaning this test  can be used) for the duration of  the COVID-19 declaration under Section 564(b)(1) of the Act, 21 U.S.C. section 360bbb-3(b)(1), unless the authorization is terminated or revoked sooner.     Influenza A by PCR NEGATIVE NEGATIVE Final   Influenza B by PCR NEGATIVE NEGATIVE Final    Comment: (NOTE) The Xpert Xpress SARS-CoV-2/FLU/RSV plus assay is intended as an aid in the diagnosis of influenza from Nasopharyngeal swab specimens and should not be used as a sole basis for treatment. Nasal washings and aspirates are  unacceptable for Xpert Xpress SARS-CoV-2/FLU/RSV testing.  Fact Sheet for Patients: BloggerCourse.com  Fact Sheet for Healthcare Providers: SeriousBroker.it  This test is not yet approved or cleared by the [place] FDA and has been authorized for detection and/or diagnosis of SARS-CoV-2 by FDA under an Emergency Use Authorization (EUA). This EUA will remain in effect (meaning this test can be used) for the duration of the COVID-19 declaration under Section 564(b)(1) of the Act, 21 U.S.C. section 360bbb-3(b)(1), unless the authorization is terminated or revoked.  Performed at [facility], [address] [place], [place] [address]   Culture, blood (Routine X 2) w Reflex to ID Panel     Status: None (Preliminary result)   Collection Time: 11/28/20  9:11 PM   Specimen: BLOOD  Result Value Ref Range Status   Specimen Description BLOOD BLOOD LEFT FOREARM  Final   Special Requests   Final    BOTTLES DRAWN AEROBIC AND ANAEROBIC Blood Culture results may not be optimal due to an inadequate volume of blood received in culture bottles   Culture   Final    NO GROWTH 3 DAYS Performed at [facility], [address] [place], [place] [address]    Report Status PENDING  Incomplete  Culture, blood (Routine X 2) w Reflex to ID Panel     Status: None (Preliminary result)   Collection Time: 11/28/20  9:11 PM   Specimen: BLOOD  Result Value Ref Range Status   Specimen Description BLOOD BLOOD RIGHT HAND  Final   Special Requests   Final    BOTTLES DRAWN AEROBIC ONLY Blood Culture results may not be optimal due to an inadequate volume of blood received in culture bottles   Culture   Final    NO GROWTH 3 DAYS Performed at [facility], [address] [place], [place] [address]    Report Status PENDING  Incomplete  Radiology Studies: No results found.      Scheduled Meds: . albuterol  1-2  puff Inhalation Q6H  . ferrous sulfate  325 mg Oral BID WC  . folic acid  1 mg Oral Daily  . multivitamin with minerals  1 tablet Oral Daily  . thiamine  100 mg Oral Daily   Continuous Infusions: . pantoprozole (PROTONIX) infusion 8 mg/hr (12/01/20 1044)     LOS: 3 days   Time spent= 35 mins    Ruffus Kamaka Joline Maxcyhirag Saarah Dewing, MD [facility]  If 7PM-7AM, please contact night-coverage  12/01/2020, 12:06 PM

## 2020-12-02 LAB — MAGNESIUM: Magnesium: 1.9 mg/dL (ref 1.7–2.4)

## 2020-12-02 LAB — CBC
HCT: 22.7 % — ABNORMAL LOW (ref 39.0–52.0)
Hemoglobin: 7.3 g/dL — ABNORMAL LOW (ref 13.0–17.0)
MCH: 28.7 pg (ref 26.0–34.0)
MCHC: 32.2 g/dL (ref 30.0–36.0)
MCV: 89.4 fL (ref 80.0–100.0)
Platelets: 153 10*3/uL (ref 150–400)
RBC: 2.54 MIL/uL — ABNORMAL LOW (ref 4.22–5.81)
RDW: 17.6 % — ABNORMAL HIGH (ref 11.5–15.5)
WBC: 7.8 10*3/uL (ref 4.0–10.5)
nRBC: 0.4 % — ABNORMAL HIGH (ref 0.0–0.2)

## 2020-12-02 LAB — BASIC METABOLIC PANEL
Anion gap: 7 (ref 5–15)
BUN: 30 mg/dL — ABNORMAL HIGH (ref 8–23)
CO2: 22 mmol/L (ref 22–32)
Calcium: 8.4 mg/dL — ABNORMAL LOW (ref 8.9–10.3)
Chloride: 107 mmol/L (ref 98–111)
Creatinine, Ser: 1.61 mg/dL — ABNORMAL HIGH (ref 0.61–1.24)
GFR, Estimated: 47 mL/min — ABNORMAL LOW (ref >60)
Glucose, Bld: 93 mg/dL (ref 70–99)
Potassium: 3.9 mmol/L (ref 3.5–5.1)
Sodium: 136 mmol/L (ref 135–145)

## 2020-12-02 MED ORDER — PANTOPRAZOLE SODIUM 40 MG PO TBEC
40.0000 mg | DELAYED_RELEASE_TABLET | Freq: Two times a day (BID) | ORAL | Status: DC
  Administered 2020-12-02 – 2020-12-04 (×4): 40 mg via ORAL
  Filled 2020-12-02 (×4): qty 1

## 2020-12-02 NOTE — Evaluation (Signed)
Occupational Therapy Re-evaluation Patient Details Name: John Escobar MRN: [id number] DOB: 08/31/1956 Today's Date: 12/02/2020    History of Present Illness 65 year old with chronic alcohol use comes to the hospital after sustaining a fall at home with dizziness.  4 years ago his baseline hemoglobin was 13.8.  He does drink regularly.  Upon admission his hemoglobin was noted to be 5.6. He was also found to be COVID-19 (+). 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   Clinical Impression   Pt seen for OT re-evaluation this date after pt underwent general anesthesia for endoscopy. Upon arrival to room, pt awake and seated upright in bed watching TV. Initially, pt deferred participating in ADLs, however was agreeable to OOB mobility "to stretch his legs". This session, pt was able to participate in sit>stand LB dressing to don underwear and shoes with MIN GUARD and walk ~79feet around the room with RW and SUPERVISION; no LOB observed this session. Pt is making good progress toward goals and continues to benefit from skilled OT services to maximize return to PLOF and minimize risk of future falls, injury, caregiver burden, and readmission. Will continue to follow POC. Discharge recommendation remains appropriate.    Follow Up Recommendations  SNF    Equipment Recommendations  3 in 1 bedside commode;Tub/shower seat;Other (comment) (2ww)       Precautions / Restrictions Precautions Precautions: Fall Restrictions Weight Bearing Restrictions: No      Mobility Bed Mobility Overal bed mobility: Needs Assistance Bed Mobility: Supine to Sit;Sit to Supine     Supine to sit: Modified independent (Device/Increase time);HOB elevated Sit to supine: Modified independent (Device/Increase time)        Transfers Overall transfer level: Needs assistance Equipment used: Rolling  walker (2 wheeled) Transfers: Sit to/from Stand Sit to Stand: Min guard         General transfer comment: Verbal cues for hand placement with RW    Balance Overall balance assessment: Needs assistance Sitting-balance support: No upper extremity supported;Feet supported Sitting balance-Leahy Scale: Good Sitting balance - Comments: Able to reach beyond BOS to thread underwear through feet and grab shoes   Standing balance support: Bilateral upper extremity supported;During functional activity Standing balance-Leahy Scale: Fair Standing balance comment: b/l reliance on RW                           ADL either performed or assessed with clinical judgement   ADL Overall ADL's : Needs assistance/impaired                     Lower Body Dressing: Min guard;Sit to/from stand Lower Body Dressing Details (indicate cue type and reason): To don underwear and shoes             Functional mobility during ADLs: Supervision/safety;Rolling walker (To walk around room ~25 feet while conversing with therapist)                    Pertinent Vitals/Pain Pain Assessment: Faces Faces Pain Scale: Hurts little more Pain Location: B knees Pain Descriptors / Indicators: Sore Pain Intervention(s): Limited activity within patient's tolerance;Monitored during session        Extremity/Trunk Assessment Upper Extremity Assessment Upper Extremity Assessment: Generalized weakness   Lower Extremity Assessment Lower Extremity Assessment: Generalized weakness       Communication Communication Communication: No difficulties   Cognition  Arousal/Alertness: Awake/alert Behavior During Therapy: Agitated Overall Cognitive Status: No family/caregiver present to determine baseline cognitive functioning                                 General Comments: Agreeable to session, however often perseverates on what is wrong with his hospital stay or past medical  encounters. Encourgaged to identify therapy goals for next session.              Home Living Family/patient expects to be discharged to:: Private residence Living Arrangements: Children Available Help at Discharge: Available 24 hours/day Type of Home: Apartment Home Access: Stairs to enter [employer] of Steps: 13 Entrance Stairs-Rails: Right Home Layout: One level               Home Equipment: Walker - 2 wheels;Cane - single point          Prior Functioning/Environment Level of Independence: Independent        Comments: Pt reports that on good days he can walk 1/4 mile to the convenience store, bad days he can hardly get around in the home        OT Problem List: Decreased strength;Decreased activity tolerance;Impaired balance (sitting and/or standing);Decreased knowledge of use of DME or AE;Pain      OT Treatment/Interventions: Self-care/ADL training;DME and/or AE instruction;Therapeutic activities;Balance training;Therapeutic exercise;Energy conservation;Patient/family education    OT Goals(Current goals can be found in the care plan section) Acute Rehab OT Goals Patient Stated Goal: no specific goal stated OT Goal Formulation: With patient Time For Goal Achievement: 12/16/20 Potential to Achieve Goals: Good  OT Frequency: Min 1X/week    AM-PAC OT "6 Clicks" Daily Activity     Outcome Measure Help from another person eating meals?: None Help from another person taking care of personal grooming?: A Little Help from another person toileting, which includes using toliet, bedpan, or urinal?: A Little Help from another person bathing (including washing, rinsing, drying)?: A Little Help from another person to put on and taking off regular upper body clothing?: A Little Help from another person to put on and taking off regular lower body clothing?: A Little 6 Click Score: 19   End of Session Equipment Utilized During Treatment: Gait belt;Rolling  walker Nurse Communication: Mobility status  Activity Tolerance: Patient tolerated treatment well Patient left: in bed;with call bell/phone within reach;with bed alarm set  OT Visit Diagnosis: Unsteadiness on feet (R26.81);Muscle weakness (generalized) (M62.81)                Time: 8502-7741 OT Time Calculation (min): 18 min Charges:  OT General Charges $OT Visit: 1 Visit OT Evaluation $OT Eval Moderate Complexity: 1 Mod  Matthew Folks, OTR/L [facility] [phone]

## 2020-12-02 NOTE — Evaluation (Addendum)
Physical Therapy Re-Evaluation Patient Details Name: John Escobar MRN: [id number] DOB: [birth year] Today's Date: 12/02/2020   History of Present Illness  65 year old with chronic alcohol use comes to the hospital after sustaining a fall at home with dizziness.  4 years ago his baseline hemoglobin was 13.8.  He does drink regularly.  Upon admission his hemoglobin was noted to be 5.6.  Patient initially refused rectal exam.  He was also found to be COVID-19 (+).  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    Clinical Impression  Pt seen for re-evaluation after pt underwent general anesthesia for endoscopy on 11/30/19 after initial PT evaluation. Pt received in bed, requiring encouragement for participation. Pt irritable throughout session but participated. Pt does experience LOB requiring min assist to correct when standing without BUE support at sink to wash face, but pt is able to ambulate short distance in room with RW, min assist & seated rest break between each trial 2/2 BLE knee soreness. Due to pt's impaired balance & mobility, as well as a flight of stairs to access apartment on 2nd floor, continue to recommend STR upon d/c maximize independence with functional mobility & reduce fall risk prior to return home. Will continue to follow pt acutely to address deficits noted in problem list below. PT goals were reviewed & updated to reflect pt's CLOF; please see PT goals for further details.       Follow Up Recommendations SNF    Equipment Recommendations  None recommended by PT    Recommendations for Other Services       Precautions / Restrictions Precautions Precautions: Fall Restrictions Weight Bearing Restrictions: No      Mobility  Bed Mobility Overal bed mobility: Needs Assistance Bed Mobility: Supine to Sit;Sit to Supine     Supine to sit: Modified  independent (Device/Increase time);HOB elevated Sit to supine: Modified independent (Device/Increase time);HOB elevated        Transfers Overall transfer level: Needs assistance Equipment used: Rolling walker (2 wheeled) Transfers: Sit to/from Stand Sit to Stand: Min assist         General transfer comment: poor awareness of safe hand placement  Ambulation/Gait Ambulation/Gait assistance: Min assist Gait Distance (Feet): 25 Feet (+ 25 ft) Assistive device: Rolling walker (2 wheeled) Gait Pattern/deviations: Decreased step length - left;Decreased step length - right;Decreased dorsiflexion - right Gait velocity: decreased      Stairs            Wheelchair Mobility    Modified Rankin (Stroke Patients Only)       Balance Overall balance assessment: Needs assistance Sitting-balance support: Feet supported Sitting balance-Leahy Scale: Fair Sitting balance - Comments: supervision sitting EOB   Standing balance support: During functional activity;No upper extremity supported Standing balance-Leahy Scale: Poor Standing balance comment: requires min assist for LOB while standing & washing face at sink without UE support, CGA when standing with at least 1UE support on RW                             Pertinent Vitals/Pain Pain Assessment: Faces Faces Pain Scale: Hurts little more Pain Location: B knees Pain Descriptors / Indicators: Sore Pain Intervention(s): Monitored during session;Limited activity within patient's tolerance (rest breaks provided PRN)    Home Living Family/patient expects to be discharged to:: Private residence Living Arrangements: Children Available Help at Discharge: Available 24  hours/day Type of Home: Apartment Home Access: Stairs to enter Entrance Stairs-Rails: Right Entrance Stairs-Number of Steps: 13 Home Layout: One level Home Equipment: Walker - 2 wheels;Cane - single point      Prior Function Level of Independence:  Independent         Comments: Pt reports that on good days he can walk 1/4 mile to the convenience store, bad days he can hardly get around in the home     Hand Dominance        Extremity/Trunk Assessment   Upper Extremity Assessment Upper Extremity Assessment: Generalized weakness (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, 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)    Lower Extremity Assessment Lower Extremity Assessment: Generalized weakness       Communication   Communication: No difficulties  Cognition Arousal/Alertness: Awake/alert Behavior During Therapy: Agitated Overall Cognitive Status: No family/caregiver present to determine baseline cognitive functioning                                 General Comments: irritable throughout session, requires gentle encouragement for participation      General Comments General comments (skin integrity, edema, etc.): c/o dizziness after 2nd gait trial & pt returned to bed, vitals: HR 84-87 bpm, SpO2 100% on room air, BP 143/85 mmHg in RUE (MAP 103)    Exercises     Assessment/Plan    PT Assessment Patient needs continued PT services  PT Problem List Decreased strength;Decreased range of motion;Decreased activity tolerance;Decreased balance;Decreased mobility;Decreased knowledge of use of DME;Decreased safety awareness;Pain       PT Treatment Interventions DME instruction;Gait training;Stair training;Functional mobility training;Therapeutic activities;Therapeutic exercise;Balance training;Patient/family education;Neuromuscular re-education    PT Goals (Current goals can be found in the Care Plan section)  Acute Rehab PT Goals Patient Stated Goal: no specific goal stated PT Goal Formulation: With patient    Frequency Min 2X/week   Barriers to discharge Inaccessible home environment flight of stairs to access apartment    Co-evaluation               AM-PAC PT "6 Clicks" Mobility  Outcome  Measure Help needed turning from your back to your side while in a flat bed without using bedrails?: None Help needed moving from lying on your back to sitting on the side of a flat bed without using bedrails?: A Little Help needed moving to and from a bed to a chair (including a wheelchair)?: A Little Help needed standing up from a chair using your arms (e.g., wheelchair or bedside chair)?: A Little Help needed to walk in hospital room?: A Little Help needed climbing 3-5 steps with a railing? : A Lot 6 Click Score: 18    End of Session   Activity Tolerance: No increased pain;Patient limited by fatigue (limited by dizziness) Patient left: in bed;with bed alarm set;with call bell/phone within reach Nurse Communication: Mobility status PT Visit Diagnosis: Difficulty in walking, not elsewhere classified (R26.2);History of falling (Z91.81);Muscle weakness (generalized) (M62.81)    Time: 5956-3875 PT Time Calculation (min) (ACUTE ONLY): 16 min   Charges:   PT Evaluation $PT Re-evaluation: 1 Re-eval          Aleda Grana, PT, DPT 12/02/20, 12:54 PM   Sandi Mariscal 12/02/2020, 12:38 PM

## 2020-12-02 NOTE — Progress Notes (Signed)
PROGRESS NOTE    John Escobar  ZOX:[id number] DOB: 1955/12/17 DOA: 11/28/2020 PCP: No primary care provider on file.   Brief Narrative:  65 year old with chronic alcohol use comes to the hospital after sustaining a fall at home with dizziness.  4 years ago his baseline hemoglobin was 13.8.  He does drink regularly.  Upon admission his hemoglobin was noted to be 5.6.  Patient initially refused rectal exam.  He was also found to be COVID-19 positive.  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  Currently patient is on PPI, getting blood transfusion as needed.  Received 72 hours of tonics drip which was transitioned to p.o.   Assessment & Plan:   Principal Problem:   Acute anemia Active Problems:   Generalized weakness   Elevated serum creatinine   COVID-19 virus infection   Lactic acidosis   Severe sepsis (HCC)   Chronic alcohol abuse  Acute anemia Severe iron deficiency anemia -Baseline hemoglobin 13.8 from 2017.  Admission hemoglobin around 5.0 thereafter received transfusion.   -Transition PPI drip to p.o. twice daily -Maintain hemoglobin greater than 7.0.  Transfuse as needed. -GI consulted, endoscopy showed hiatal hernia, nonbleeding gastric ulcer, gastritis and duodenal erosion. -On p.o. iron insufflated twice daily with bowel regimen  Multiple falls with generalized weakness -Secondary to alcohol intoxication?Marland Kitchen  PT/OT -TSH-normal -UDS-negative  Sepsis secondary to mild acute respiratory distress COVID-19 infection -Sepsis physiology has resolved. -Reported of subjective fevers chills and shortness of breath at home. -Chest x-ray-Mild bil Infiltrates vs atelactesis.  -Incentive spirometer, flutter valve, bronchodilators -Remdesivir day 3/3; completed 2/18 -Patient is not hypoxic therefore hold off on steroids -Procalcitonin- 0.16 -BNP-190  Acute kidney  injury -Baseline creatinine 1.0.  Admission creatinine 1.75.  Continue IV fluids.  Morning creatinine 1.6 -Alcohol withdrawal protocol -Thiamine, multivitamin, folic acid  Chronic alcohol use -Advised to quit drinking alcohol. CIWA added. Thiamine, folate and MV   DVT prophylaxis: SCDs Code Status: Full  Family Communication:    Status is: Inpatient  Remains inpatient appropriate because:Inpatient level of care appropriate due to severity of illness   Dispo: The patient is from: Home              Anticipated d/c is to: SNF              Anticipated d/c date is: > 3 days              Patient currently is not medically stable to d/c.   Difficult to place patient No   Body mass index is 18.82 kg/m.    Subjective: Tells me he feels okay does not have any complaints this morning.  Review of Systems Otherwise negative except as per HPI, including: General: Denies fever, chills, night sweats or unintended weight loss. Resp: Denies cough, wheezing, shortness of breath. Cardiac: Denies chest pain, palpitations, orthopnea, paroxysmal nocturnal dyspnea. GI: Denies abdominal pain, nausea, vomiting, diarrhea or constipation GU: Denies dysuria, frequency, hesitancy or incontinence MS: Denies muscle aches, joint pain or swelling Neuro: Denies headache, neurologic deficits (focal weakness, numbness, tingling), abnormal gait Psych: Denies anxiety, depression, SI/HI/AVH Skin: Denies new rashes or lesions ID: Denies sick contacts, exotic exposures, travel  Examination: Constitutional: Not in acute distress Respiratory: Clear to auscultation bilaterally Cardiovascular: Normal sinus rhythm, no rubs Abdomen: Nontender nondistended good bowel sounds Musculoskeletal: No edema noted Skin: No rashes seen Neurologic: CN 2-12 grossly intact.  And nonfocal Psychiatric: Normal judgment  and insight. Alert and oriented x 3. Normal mood. Objective: Vitals:   12/01/20 2058 12/02/20 0021 12/02/20  0540 12/02/20 1008  BP: (!) 147/85 (!) 155/79 137/82 (!) 149/86  Pulse: 83 78 84 80  Resp: 16 18 20 20   Temp: 98.2 F (36.8 C) 97.9 F (36.6 C) 98 F (36.7 C) 98.2 F (36.8 C)  TempSrc: Oral Oral Oral Oral  SpO2: 99% 98% 98% 100%  Weight:      Height:        Intake/Output Summary (Last 24 hours) at 12/02/2020 1142 Last data filed at 12/02/2020 0527 Gross per 24 hour  Intake 362.3 ml  Output 1950 ml  Net -1587.7 ml   Filed Weights   11/28/20 1511 11/29/20 0500 11/30/20 0302  Weight: 85 kg 63.2 kg 64.7 kg     Data Reviewed:   CBC: Recent Labs  Lab 11/28/20 1518 11/29/20 0052 11/29/20 0427 11/29/20 2233 11/30/20 0400 11/30/20 1550 12/01/20 0505 12/02/20 0532  WBC 11.9*  --  9.3  --  9.4  --  8.4 7.8  NEUTROABS 10.3*  --  7.5  --   --   --   --   --   HGB 5.6*   < > 7.1* 5.1* 5.0* 8.1* 7.5* 7.3*  HCT 19.8*   < > 22.5* 16.6* 16.0* 25.2* 23.1* 22.7*  MCV 85.7  --  86.2  --  88.4  --  88.5 89.4  PLT 247  --  171  --  155  --  143* 153   < > = values in this interval not displayed.   Basic Metabolic Panel: Recent Labs  Lab 11/28/20 1518 11/29/20 0052 11/29/20 0427 11/30/20 0400 12/01/20 0505 12/02/20 0532  NA 137  --  144 140 143 136  K 4.3  --  4.6 4.3 3.9 3.9  CL 112*  --  116* 115* 113* 107  CO2 15*  --  19* 17* 21* 22  GLUCOSE 196*  --  128* 147* 104* 93  BUN 60*  --  72* 66* 45* 30*  CREATININE 1.75*  --  1.78* 1.86* 1.73* 1.61*  CALCIUM 8.2*  --  8.5* 8.5* 8.6* 8.4*  MG  --  2.2 2.1 2.0 2.1 1.9  PHOS  --   --  3.8  --   --   --    GFR: Estimated Creatinine Clearance: 42.4 mL/min (A) (by C-G formula based on SCr of 1.61 mg/dL (H)). Liver Function Tests: Recent Labs  Lab 11/28/20 1518 11/29/20 0427  AST 17 13*  ALT 7 8  ALKPHOS 40 35*  BILITOT 0.5 0.3  PROT 6.1* 5.5*  ALBUMIN 2.8* 2.6*   Recent Labs  Lab 11/28/20 1518  LIPASE 33   No results for input(s): AMMONIA in the last 168 hours. Coagulation Profile: Recent Labs  Lab  11/29/20 0052 11/29/20 0427  INR 1.2 1.2   Cardiac Enzymes: No results for input(s): CKTOTAL, CKMB, CKMBINDEX, TROPONINI in the last 168 hours. BNP (last 3 results) No results for input(s): PROBNP in the last 8760 hours. HbA1C: No results for input(s): HGBA1C in the last 72 hours. CBG: No results for input(s): GLUCAP in the last 168 hours. Lipid Profile: No results for input(s): CHOL, HDL, LDLCALC, TRIG, CHOLHDL, LDLDIRECT in the last 72 hours. Thyroid Function Tests: No results for input(s): TSH, T4TOTAL, FREET4, T3FREE, THYROIDAB in the last 72 hours. Anemia Panel: No results for input(s): VITAMINB12, FOLATE, FERRITIN, TIBC, IRON, RETICCTPCT in the last 72 hours. Sepsis  Labs: Recent Labs  Lab 11/28/20 1551 11/28/20 1629 11/29/20 0427  PROCALCITON  --   --  0.16  LATICACIDVEN 3.5* 1.9  --     Recent Results (from the past 240 hour(s))  Resp Panel by RT-PCR (Flu A&B, Covid) Nasopharyngeal Swab     Status: Abnormal   Collection Time: 11/28/20  3:18 PM   Specimen: Nasopharyngeal Swab; Nasopharyngeal(NP) swabs in vial transport medium  Result Value Ref Range Status   SARS Coronavirus 2 by RT PCR POSITIVE (A) NEGATIVE Final    Comment: CRITICAL RESULT CALLED TO, READ BACK BY AND VERIFIED WITH: [facility] 11/28/20 1658 KLW (NOTE) SARS-CoV-2 target nucleic acids are DETECTED.  The SARS-CoV-2 RNA is generally detectable in upper respiratory specimens during the acute phase of infection. Positive results are indicative of the presence of the identified virus, but do not rule out bacterial infection or co-infection with other pathogens not detected by the test. Clinical correlation with patient history and other diagnostic information is necessary to determine patient infection status. The expected result is Negative.  Fact Sheet for Patients: BloggerCourse.com  Fact Sheet for Healthcare Providers: SeriousBroker.it  This  test is not yet approved or cleared by the [place] FDA and  has been authorized for detection and/or diagnosis of SARS-CoV-2 by FDA under an Emergency Use Authorization (EUA).  This EUA will remain in effect (meaning this test  can be used) for the duration of  the COVID-19 declaration under Section 564(b)(1) of the Act, 21 U.S.C. section 360bbb-3(b)(1), unless the authorization is terminated or revoked sooner.     Influenza A by PCR NEGATIVE NEGATIVE Final   Influenza B by PCR NEGATIVE NEGATIVE Final    Comment: (NOTE) The Xpert Xpress SARS-CoV-2/FLU/RSV plus assay is intended as an aid in the diagnosis of influenza from Nasopharyngeal swab specimens and should not be used as a sole basis for treatment. Nasal washings and aspirates are unacceptable for Xpert Xpress SARS-CoV-2/FLU/RSV testing.  Fact Sheet for Patients: BloggerCourse.com  Fact Sheet for Healthcare Providers: SeriousBroker.it  This test is not yet approved or cleared by the [place] FDA and has been authorized for detection and/or diagnosis of SARS-CoV-2 by FDA under an Emergency Use Authorization (EUA). This EUA will remain in effect (meaning this test can be used) for the duration of the COVID-19 declaration under Section 564(b)(1) of the Act, 21 U.S.C. section 360bbb-3(b)(1), unless the authorization is terminated or revoked.  Performed at [facility], [address] [place], [place] [address]   Culture, blood (Routine X 2) w Reflex to ID Panel     Status: None (Preliminary result)   Collection Time: 11/28/20  9:11 PM   Specimen: BLOOD  Result Value Ref Range Status   Specimen Description BLOOD BLOOD LEFT FOREARM  Final   Special Requests   Final    BOTTLES DRAWN AEROBIC AND ANAEROBIC Blood Culture results may not be optimal due to an inadequate volume of blood received in culture bottles   Culture   Final    NO GROWTH 4  DAYS Performed at [facility], [address] [place], [place] [address]    Report Status PENDING  Incomplete  Culture, blood (Routine X 2) w Reflex to ID Panel     Status: None (Preliminary result)   Collection Time: 11/28/20  9:11 PM   Specimen: BLOOD  Result Value Ref Range Status   Specimen Description BLOOD BLOOD RIGHT HAND  Final   Special Requests   Final  BOTTLES DRAWN AEROBIC ONLY Blood Culture results may not be optimal due to an inadequate volume of blood received in culture bottles   Culture   Final    NO GROWTH 4 DAYS Performed at [facility], [address], [place], [place] [address]    Report Status PENDING  Incomplete         Radiology Studies: No results found.      Scheduled Meds: . albuterol  1-2 puff Inhalation Q6H  . ferrous sulfate  325 mg Oral BID WC  . folic acid  1 mg Oral Daily  . multivitamin with minerals  1 tablet Oral Daily  . pantoprazole  40 mg Oral BID AC  . thiamine  100 mg Oral Daily   Continuous Infusions:    LOS: 4 days   Time spent= 35 mins    Legend Tumminello Joline Maxcy, MD [facility]  If 7PM-7AM, please contact night-coverage  12/02/2020, 11:42 AM

## 2020-12-03 LAB — MAGNESIUM: Magnesium: 1.9 mg/dL (ref 1.7–2.4)

## 2020-12-03 LAB — BASIC METABOLIC PANEL
Anion gap: 7 (ref 5–15)
BUN: 25 mg/dL — ABNORMAL HIGH (ref 8–23)
CO2: 24 mmol/L (ref 22–32)
Calcium: 8.5 mg/dL — ABNORMAL LOW (ref 8.9–10.3)
Chloride: 104 mmol/L (ref 98–111)
Creatinine, Ser: 1.6 mg/dL — ABNORMAL HIGH (ref 0.61–1.24)
GFR, Estimated: 48 mL/min — ABNORMAL LOW (ref >60)
Glucose, Bld: 98 mg/dL (ref 70–99)
Potassium: 3.9 mmol/L (ref 3.5–5.1)
Sodium: 135 mmol/L (ref 135–145)

## 2020-12-03 LAB — CBC
HCT: 23.5 % — ABNORMAL LOW (ref 39.0–52.0)
Hemoglobin: 7.6 g/dL — ABNORMAL LOW (ref 13.0–17.0)
MCH: 29.1 pg (ref 26.0–34.0)
MCHC: 32.3 g/dL (ref 30.0–36.0)
MCV: 90 fL (ref 80.0–100.0)
Platelets: 146 10*3/uL — ABNORMAL LOW (ref 150–400)
RBC: 2.61 MIL/uL — ABNORMAL LOW (ref 4.22–5.81)
RDW: 19.1 % — ABNORMAL HIGH (ref 11.5–15.5)
WBC: 8.8 10*3/uL (ref 4.0–10.5)
nRBC: 0 % (ref 0.0–0.2)

## 2020-12-03 LAB — CULTURE, BLOOD (ROUTINE X 2)
Culture: NO GROWTH
Culture: NO GROWTH

## 2020-12-03 NOTE — Progress Notes (Signed)
PROGRESS NOTE    John Escobar  WLN:[id number] DOB: 01/18/1956 DOA: 11/28/2020 PCP: No primary care provider on file.   Brief Narrative:  65 year old with chronic alcohol use comes to the hospital after sustaining a fall at home with dizziness.  4 years ago his baseline hemoglobin was 13.8.  He does drink regularly.  Upon admission his hemoglobin was noted to be 5.6.  Patient initially refused rectal exam.  He was also found to be COVID-19 positive.  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  Currently patient is on PPI, getting blood transfusion as needed.  After 72 hours of Protonix he was transitioned to p.o. PPI.   Assessment & Plan:   Principal Problem:   Acute anemia Active Problems:   Generalized weakness   Elevated serum creatinine   COVID-19 virus infection   Lactic acidosis   Severe sepsis (HCC)   Chronic alcohol abuse  Acute anemia Severe iron deficiency anemia -Baseline hemoglobin 13.8 from 2017.  Admission hemoglobin around 5.0 thereafter received transfusion.  Hemoglobin this morning 7.6. -PPI p.o. twice daily -Maintain hemoglobin greater than 7.0.  Transfuse as needed. -GI consulted, endoscopy showed hiatal hernia, nonbleeding gastric ulcer, gastritis and duodenal erosion. -Oral iron with bowel regimen  Multiple falls with generalized weakness -Secondary to alcohol intoxication?Marland Kitchen  PT/OT -TSH-normal -UDS-negative  Sepsis secondary to mild acute respiratory distress COVID-19 infection -Sepsis has resolved -Reported of subjective fevers chills and shortness of breath at home. -Chest x-ray-Mild bil Infiltrates vs atelactesis.  -Incentive spirometer, flutter valve, bronchodilators -Remdesivir day 3/3; completed 2/18 -Patient is not hypoxic therefore hold off on steroids -Procalcitonin- 0.16 -BNP-190  Acute kidney injury -Baseline creatinine 1.0.   Admission creatinine 1.75.    Creatinine 1.6 -Alcohol withdrawal protocol -Thiamine, multivitamin, folic acid  Chronic alcohol use -Advised to quit drinking alcohol. CIWA added. Thiamine, folate and MV   DVT prophylaxis: SCDs Code Status: Full  Family Communication:    Status is: Inpatient  Remains inpatient appropriate because:Inpatient level of care appropriate due to severity of illness   Dispo: The patient is from: Home              Anticipated d/c is to: SNF versus home health              Anticipated d/c date is: 1 day              Patient currently is not medically stable to d/c.  Closely monitor hemoglobin today, if remains stable he can be discharged tomorrow.   Difficult to place patient No   Body mass index is 18.82 kg/m.    Subjective: Patient refusing SNF.  States he wants to go home meds and he gets discharged from home.  Review of Systems Otherwise negative except as per HPI, including: General = no fevers, chills, dizziness,  fatigue HEENT/EYES = negative for loss of vision, double vision, blurred vision,  sore throa Cardiovascular= negative for chest pain, palpitation Respiratory/lungs= negative for shortness of breath, cough, wheezing; hemoptysis,  Gastrointestinal= negative for nausea, vomiting, abdominal pain Genitourinary= negative for Dysuria MSK = Negative for arthralgia, myalgias Neurology= Negative for headache, numbness, tingling  Psychiatry= Negative for suicidal and homocidal ideation Skin= Negative for Rash   Examination: Constitutional: Not in acute distress Respiratory: Clear to auscultation bilaterally Cardiovascular: Normal sinus rhythm, no rubs Abdomen: Nontender nondistended good bowel sounds Musculoskeletal: No edema noted Skin: No rashes seen Neurologic: CN 2-12 grossly  intact.  And nonfocal Psychiatric: Normal judgment and insight. Alert and oriented x 3. Normal mood.    Objective: Vitals:   12/02/20 1008 12/02/20 1700  12/02/20 2216 12/03/20 0500  BP: (!) 149/86 140/84 124/77 118/76  Pulse: 80 82 82 81  Resp: 20 18 20 18   Temp: 98.2 F (36.8 C) 98 F (36.7 C) 98.7 F (37.1 C) 98.7 F (37.1 C)  TempSrc: Oral Oral Oral   SpO2: 100% 100% 99% 98%  Weight:      Height:        Intake/Output Summary (Last 24 hours) at 12/03/2020 1333 Last data filed at 12/03/2020 1239 Gross per 24 hour  Intake -  Output 800 ml  Net -800 ml   Filed Weights   11/28/20 1511 11/29/20 0500 11/30/20 0302  Weight: 85 kg 63.2 kg 64.7 kg     Data Reviewed:   CBC: Recent Labs  Lab 11/28/20 1518 11/29/20 0052 11/29/20 0427 11/29/20 2233 11/30/20 0400 11/30/20 1550 12/01/20 0505 12/02/20 0532 12/03/20 0526  WBC 11.9*  --  9.3  --  9.4  --  8.4 7.8 8.8  NEUTROABS 10.3*  --  7.5  --   --   --   --   --   --   HGB 5.6*   < > 7.1*   < > 5.0* 8.1* 7.5* 7.3* 7.6*  HCT 19.8*   < > 22.5*   < > 16.0* 25.2* 23.1* 22.7* 23.5*  MCV 85.7  --  86.2  --  88.4  --  88.5 89.4 90.0  PLT 247  --  171  --  155  --  143* 153 146*   < > = values in this interval not displayed.   Basic Metabolic Panel: Recent Labs  Lab 11/29/20 0427 11/30/20 0400 12/01/20 0505 12/02/20 0532 12/03/20 0526  NA 144 140 143 136 135  K 4.6 4.3 3.9 3.9 3.9  CL 116* 115* 113* 107 104  CO2 19* 17* 21* 22 24  GLUCOSE 128* 147* 104* 93 98  BUN 72* 66* 45* 30* 25*  CREATININE 1.78* 1.86* 1.73* 1.61* 1.60*  CALCIUM 8.5* 8.5* 8.6* 8.4* 8.5*  MG 2.1 2.0 2.1 1.9 1.9  PHOS 3.8  --   --   --   --    GFR: Estimated Creatinine Clearance: 42.7 mL/min (A) (by C-G formula based on SCr of 1.6 mg/dL (H)). Liver Function Tests: Recent Labs  Lab 11/28/20 1518 11/29/20 0427  AST 17 13*  ALT 7 8  ALKPHOS 40 35*  BILITOT 0.5 0.3  PROT 6.1* 5.5*  ALBUMIN 2.8* 2.6*   Recent Labs  Lab 11/28/20 1518  LIPASE 33   No results for input(s): AMMONIA in the last 168 hours. Coagulation Profile: Recent Labs  Lab 11/29/20 0052 11/29/20 0427  INR 1.2 1.2    Cardiac Enzymes: No results for input(s): CKTOTAL, CKMB, CKMBINDEX, TROPONINI in the last 168 hours. BNP (last 3 results) No results for input(s): PROBNP in the last 8760 hours. HbA1C: No results for input(s): HGBA1C in the last 72 hours. CBG: No results for input(s): GLUCAP in the last 168 hours. Lipid Profile: No results for input(s): CHOL, HDL, LDLCALC, TRIG, CHOLHDL, LDLDIRECT in the last 72 hours. Thyroid Function Tests: No results for input(s): TSH, T4TOTAL, FREET4, T3FREE, THYROIDAB in the last 72 hours. Anemia Panel: No results for input(s): VITAMINB12, FOLATE, FERRITIN, TIBC, IRON, RETICCTPCT in the last 72 hours. Sepsis Labs: Recent Labs  Lab 11/28/20 1551 11/28/20 1629  11/29/20 0427  PROCALCITON  --   --  0.16  LATICACIDVEN 3.5* 1.9  --     Recent Results (from the past 240 hour(s))  Resp Panel by RT-PCR (Flu A&B, Covid) Nasopharyngeal Swab     Status: Abnormal   Collection Time: 11/28/20  3:18 PM   Specimen: Nasopharyngeal Swab; Nasopharyngeal(NP) swabs in vial transport medium  Result Value Ref Range Status   SARS Coronavirus 2 by RT PCR POSITIVE (A) NEGATIVE Final    Comment: CRITICAL RESULT CALLED TO, READ BACK BY AND VERIFIED WITH: [facility] 11/28/20 1658 KLW (NOTE) SARS-CoV-2 target nucleic acids are DETECTED.  The SARS-CoV-2 RNA is generally detectable in upper respiratory specimens during the acute phase of infection. Positive results are indicative of the presence of the identified virus, but do not rule out bacterial infection or co-infection with other pathogens not detected by the test. Clinical correlation with patient history and other diagnostic information is necessary to determine patient infection status. The expected result is Negative.  Fact Sheet for Patients: BloggerCourse.comhttps://www.fda.gov/media/152166/download  Fact Sheet for Healthcare Providers: SeriousBroker.ithttps://www.fda.gov/media/152162/download  This test is not yet approved or cleared by the  [place] FDA and  has been authorized for detection and/or diagnosis of SARS-CoV-2 by FDA under an Emergency Use Authorization (EUA).  This EUA will remain in effect (meaning this test  can be used) for the duration of  the COVID-19 declaration under Section 564(b)(1) of the Act, 21 U.S.C. section 360bbb-3(b)(1), unless the authorization is terminated or revoked sooner.     Influenza A by PCR NEGATIVE NEGATIVE Final   Influenza B by PCR NEGATIVE NEGATIVE Final    Comment: (NOTE) The Xpert Xpress SARS-CoV-2/FLU/RSV plus assay is intended as an aid in the diagnosis of influenza from Nasopharyngeal swab specimens and should not be used as a sole basis for treatment. Nasal washings and aspirates are unacceptable for Xpert Xpress SARS-CoV-2/FLU/RSV testing.  Fact Sheet for Patients: BloggerCourse.comhttps://www.fda.gov/media/152166/download  Fact Sheet for Healthcare Providers: SeriousBroker.ithttps://www.fda.gov/media/152162/download  This test is not yet approved or cleared by the [place] FDA and has been authorized for detection and/or diagnosis of SARS-CoV-2 by FDA under an Emergency Use Authorization (EUA). This EUA will remain in effect (meaning this test can be used) for the duration of the COVID-19 declaration under Section 564(b)(1) of the Act, 21 U.S.C. section 360bbb-3(b)(1), unless the authorization is terminated or revoked.  Performed at [facility], [address] [place], [place] [address]   Culture, blood (Routine X 2) w Reflex to ID Panel     Status: None   Collection Time: 11/28/20  9:11 PM   Specimen: BLOOD  Result Value Ref Range Status   Specimen Description BLOOD BLOOD LEFT FOREARM  Final   Special Requests   Final    BOTTLES DRAWN AEROBIC AND ANAEROBIC Blood Culture results may not be optimal due to an inadequate volume of blood received in culture bottles   Culture   Final    NO GROWTH 5 DAYS Performed at [facility], [address] [place],  [place] [address]    Report Status 12/03/2020 FINAL  Final  Culture, blood (Routine X 2) w Reflex to ID Panel     Status: None   Collection Time: 11/28/20  9:11 PM   Specimen: BLOOD  Result Value Ref Range Status   Specimen Description BLOOD BLOOD RIGHT HAND  Final   Special Requests   Final    BOTTLES DRAWN AEROBIC ONLY Blood Culture results may not be  optimal due to an inadequate volume of blood received in culture bottles   Culture   Final    NO GROWTH 5 DAYS Performed at [facility], [address], [place], [place] [address]    Report Status 12/03/2020 FINAL  Final         Radiology Studies: No results found.      Scheduled Meds: . ferrous sulfate  325 mg Oral BID WC  . folic acid  1 mg Oral Daily  . multivitamin with minerals  1 tablet Oral Daily  . pantoprazole  40 mg Oral BID AC  . thiamine  100 mg Oral Daily   Continuous Infusions:    LOS: 5 days   Time spent= 35 mins    Ankit Joline Maxcy, MD [facility]  If 7PM-7AM, please contact night-coverage  12/03/2020, 1:33 PM

## 2020-12-04 LAB — BASIC METABOLIC PANEL
Anion gap: 7 (ref 5–15)
BUN: 18 mg/dL (ref 8–23)
CO2: 25 mmol/L (ref 22–32)
Calcium: 8.5 mg/dL — ABNORMAL LOW (ref 8.9–10.3)
Chloride: 105 mmol/L (ref 98–111)
Creatinine, Ser: 1.32 mg/dL — ABNORMAL HIGH (ref 0.61–1.24)
GFR, Estimated: >60 mL/min (ref >60)
Glucose, Bld: 107 mg/dL — ABNORMAL HIGH (ref 70–99)
Potassium: 3.9 mmol/L (ref 3.5–5.1)
Sodium: 137 mmol/L (ref 135–145)

## 2020-12-04 LAB — CBC
HCT: 25.7 % — ABNORMAL LOW (ref 39.0–52.0)
Hemoglobin: 8.3 g/dL — ABNORMAL LOW (ref 13.0–17.0)
MCH: 29.4 pg (ref 26.0–34.0)
MCHC: 32.3 g/dL (ref 30.0–36.0)
MCV: 91.1 fL (ref 80.0–100.0)
Platelets: 153 10*3/uL (ref 150–400)
RBC: 2.82 MIL/uL — ABNORMAL LOW (ref 4.22–5.81)
RDW: 19.9 % — ABNORMAL HIGH (ref 11.5–15.5)
WBC: 10 10*3/uL (ref 4.0–10.5)
nRBC: 0.2 % (ref 0.0–0.2)

## 2020-12-04 LAB — MAGNESIUM: Magnesium: 2 mg/dL (ref 1.7–2.4)

## 2020-12-04 LAB — METHYLMALONIC ACID, SERUM: Methylmalonic Acid, Quantitative: 509 nmol/L — ABNORMAL HIGH (ref 0–378)

## 2020-12-04 MED ORDER — ALBUTEROL SULFATE HFA 108 (90 BASE) MCG/ACT IN AERS: 1.0000 | INHALATION_SPRAY | RESPIRATORY_TRACT | 0 refills | Status: DC | PRN

## 2020-12-04 MED ORDER — THIAMINE HCL 100 MG PO TABS: 100.0000 mg | ORAL_TABLET | Freq: Every day | ORAL | 0 refills | Status: DC

## 2020-12-04 MED ORDER — SENNOSIDES-DOCUSATE SODIUM 8.6-50 MG PO TABS: 1.0000 | ORAL_TABLET | Freq: Every evening | ORAL | 0 refills | Status: DC | PRN

## 2020-12-04 MED ORDER — FOLIC ACID 1 MG PO TABS: 1.0000 mg | ORAL_TABLET | Freq: Every day | ORAL | 0 refills | Status: DC

## 2020-12-04 MED ORDER — ADULT MULTIVITAMIN W/MINERALS CH: 1.0000 | ORAL_TABLET | Freq: Every day | ORAL | 0 refills | Status: DC

## 2020-12-04 MED ORDER — PANTOPRAZOLE SODIUM 40 MG PO TBEC: 40.0000 mg | DELAYED_RELEASE_TABLET | Freq: Two times a day (BID) | ORAL | 0 refills | Status: DC

## 2020-12-04 MED ORDER — FERROUS SULFATE 325 (65 FE) MG PO TABS: 325.0000 mg | ORAL_TABLET | Freq: Two times a day (BID) | ORAL | 0 refills | Status: DC

## 2020-12-04 NOTE — TOC Initial Note (Signed)
Transition of Care [facility]) - Initial/Assessment Note    Patient Details  Name: John Escobar MRN: [id number] Date of Birth: 1956-04-26  [facility] CM/SW Contact:    Chapman Fitch, RN Phone Number: 12/04/2020, 10:56 AM  Clinical Narrative:                 Patient admitted from home with anemia.  Patient states that he lives at home with his 3 adult children.  2 of the children work, 1 is at home 24/7.    Patient states that no one has a Youth worker of access to a car.  Patient states normally he uses a cab for transportation  Discussed recommendation of SNF.  Patient states he will be going home at discharge.  Discussed option of home health services.  Patient adamantly declines.  Patient states he has tried that in the past and doesn't want anyone coming back out.  MD notified.   Patient states that he has a RW and cane in the home and does not want any other DME as his "place is to small" for anything else.   No PCP listed, patient states it has been a "while" since he has been anywhere.  Offered to make new patient appointment for PCP at discharge.  Patient declined.    Delivered changed of clothes for the patient for discharge [facility] transport set for 1 pm.  Bedside RN notified and they will call her directly when they arrive  Son notified and will be available at the apartment to assist getting in   Expected Discharge Plan: Home/Self Care Barriers to Discharge: No Barriers Identified   Patient Goals and CMS Choice        Expected Discharge Plan and Services Expected Discharge Plan: Home/Self Care   Discharge Planning Services: CM Consult   Living arrangements for the past 2 months: Apartment Expected Discharge Date: 12/04/20                         [facility] Arranged: Patient Refused HH          Prior Living Arrangements/Services Living arrangements for the past 2 months: Apartment Lives with:: Adult Children Patient language and need for  interpreter reviewed:: Yes Do you feel safe going back to the place where you live?: Yes      Need for Family Participation in Patient Care: Yes (Comment) Care giver support system in place?: Yes (comment)   Criminal Activity/Legal Involvement Pertinent to Current Situation/Hospitalization: No - Comment as needed  Activities of Daily Living Home Assistive Devices/Equipment: None ADL Screening (condition at time of admission) Patient's cognitive ability adequate to safely complete daily activities?: Yes Is the patient deaf or have difficulty hearing?: No Does the patient have difficulty seeing, even when wearing glasses/contacts?: No Does the patient have difficulty concentrating, remembering, or making decisions?: No Patient able to express need for assistance with ADLs?: Yes Does the patient have difficulty dressing or bathing?: No Independently performs ADLs?: Yes (appropriate for developmental age) Does the patient have difficulty walking or climbing stairs?: No Weakness of Legs: None Weakness of Arms/Hands: None  Permission Sought/Granted                  Emotional Assessment       Orientation: : Oriented to Self,Oriented to Place,Oriented to  Time,Oriented to Situation      Admission diagnosis:  Acute anemia [D64.9] Patient Active Problem List   Diagnosis  Date Noted  . Acute anemia 11/28/2020  . Generalized weakness 11/28/2020  . Elevated serum creatinine 11/28/2020  . COVID-19 virus infection 11/28/2020  . Lactic acidosis 11/28/2020  . Severe sepsis (HCC) 11/28/2020  . Chronic alcohol abuse 11/28/2020   PCP:  No primary care provider on file. Pharmacy:  No Pharmacies Listed    Social Determinants of Health (SDOH) Interventions    Readmission Risk Interventions No flowsheet data found.

## 2020-12-04 NOTE — Care Management Important Message (Signed)
Important Message  Patient Details  Name: John Escobar MRN: [id number] Date of Birth: 10-30-1955   Medicare Important Message Given:  Yes - Important Message mailed due to current National Emergency  Reviewed with patient via room phone due to isolation status.  Copy of Medicare IM sent to home address on file.   [name] 12/04/2020, 11:07 AM

## 2020-12-04 NOTE — Progress Notes (Signed)
Patient discharged home , patient given discharge instructions , clothing to wear home. Patient given prescriptions in his hand . Patient transported down to medical mall entrance via staxi chair by staff. Safe transport waiting at entrance. Anselm Jungling

## 2020-12-04 NOTE — Discharge Summary (Signed)
Physician Discharge Summary  John Escobar JXB:[id number] DOB: 30-Sep-1956 DOA: 11/28/2020  PCP: No primary care provider on file.  Admit date: 11/28/2020 Discharge date: 12/04/2020  Admitted From: Home Disposition: Home  Recommendations for Outpatient Follow-up:  1. Follow up with PCP in 1-2 weeks 2. Please obtain BMP/CBC in one week your next doctors visit.  3. Advised to take Protonix twice daily before meals 4. Advised to quit drinking alcohol 5. Needs to follow-up outpatient gastroenterology and 4-6 weeks 6. Iron supplements with bowel regimen prescribed 7. At patient's request he has been given all the signed prescriptions for him to take it to the pharmacy of his choice.  Discharge Condition: Stable CODE STATUS: Full code Diet recommendation: Regular  Brief/Interim Summary: 65 year old with chronic alcohol use comes to the hospital after sustaining a fall at home with dizziness.  4 years ago his baseline hemoglobin was 13.8.  He does drink regularly.  Upon admission his hemoglobin was noted to be 5.6.  Patient initially refused rectal exam.  He was also found to be COVID-19 positive.  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  Currently patient is on PPI, getting blood transfusion as needed.  After 72 hours of Protonix he was transitioned to p.o. PPI.  His hemoglobin remained stable.  PT recommended SNF but patient adamantly refused and wanted to go home.  Home health arrangements were made.  Patient stable for discharge with recommendations as stated above.   Assessment & Plan:   Principal Problem:   Acute anemia Active Problems:   Generalized weakness   Elevated serum creatinine   COVID-19 virus infection   Lactic acidosis   Severe sepsis (HCC)   Chronic alcohol abuse  Acute anemia Severe iron deficiency anemia -Baseline hemoglobin 13.8 from 2017.  Admission  hemoglobin around 5.0 thereafter received transfusion.    Hemoglobin remained stable -PPI p.o. twice daily -Maintain hemoglobin greater than 7.0.  Transfuse as needed. -GI consulted, endoscopy showed hiatal hernia, nonbleeding gastric ulcer, gastritis and duodenal erosion. -Oral iron with bowel regimen  Multiple falls with generalized weakness -Secondary to alcohol intoxication?Marland Kitchen  PT/OT-recommended SNF but patient refused therefore home health arrangements made. -TSH-normal -UDS-negative  Sepsis secondary to mild acute respiratory distress COVID-19 infection -Sepsis is resolved.  He remains asymptomatic from COVID-19 infection. -Reported of subjective fevers chills and shortness of breath at home. -Chest x-ray-Mild bil Infiltrates vs atelactesis.  -Incentive spirometer, flutter valve, bronchodilators.  Albuterol inhaler has been prescribed at discharge -Remdesivir day 3/3; completed 2/18 -Patient is not hypoxic therefore hold off on steroids -Procalcitonin- 0.16 -BNP-190  Acute kidney injury -Baseline creatinine 1.0.  Admission creatinine 1.75.      Creatinine on discharge 1.32.  Continues to improve.  Advised oral hydration at home  Chronic alcohol use -Advised him to quit drinking.  Thiamine, folic acid and multivitamin prescribed    Body mass index is 18.82 kg/m.         Discharge Diagnoses:  Principal Problem:   Acute anemia Active Problems:   Generalized weakness   Elevated serum creatinine   COVID-19 virus infection   Lactic acidosis   Severe sepsis (HCC)   Chronic alcohol abuse   Subjective: Feels great no complaints.  Refusing to go to SNF.  Discharge Exam: Vitals:   12/03/20 2339 12/04/20 0524  BP: 119/77 123/75  Pulse: 84 90  Resp: 16 16  Temp: 99.2 F (37.3 C) 98.9 F (37.2 C)  SpO2: 99% 100%   Vitals:   12/03/20 1609 12/03/20 2107 12/03/20 2339 12/04/20 0524  BP: 121/81 140/77 119/77 123/75  Pulse: (!) 101 96 84 90  Resp: Temp: 98.7 F (37.1 C) 97.8 F (36.6 C) 99.2 F (37.3 C) 98.9 F (37.2 C)  TempSrc:   Oral Oral  SpO2: 100% 100% 99% 100%  Weight:      Height:        General: Pt is alert, awake, not in acute distress Cardiovascular: RRR, S1/S2 +, no rubs, no gallops Respiratory: CTA bilaterally, no wheezing, no rhonchi Abdominal: Soft, NT, ND, bowel sounds + Extremities: no edema, no cyanosis  Discharge Instructions   Allergies as of 12/04/2020   No Known Allergies     Medication List    TAKE these medications   albuterol 108 (90 Base) MCG/ACT inhaler Commonly known as: VENTOLIN HFA Inhale 1-2 puffs into the lungs every 4 (four) hours as needed for wheezing or shortness of breath.   ferrous sulfate 325 (65 FE) MG tablet Take 1 tablet (325 mg total) by mouth 2 (two) times daily with a meal.   folic acid 1 MG tablet Commonly known as: FOLVITE Take 1 tablet (1 mg total) by mouth daily.   multivitamin with minerals Tabs tablet Take 1 tablet by mouth daily.   pantoprazole 40 MG tablet Commonly known as: PROTONIX Take 1 tablet (40 mg total) by mouth 2 (two) times daily before a meal.   senna-docusate 8.6-50 MG tablet Commonly known as: Senokot-S Take 1 tablet by mouth at bedtime as needed for moderate constipation.   thiamine 100 MG tablet Take 1 tablet (100 mg total) by mouth daily.       No Known Allergies  You were cared for by a hospitalist during your hospital stay. If you have any questions about your discharge medications or the care you received while you were in the hospital after you are discharged, you can call the unit and asked to speak with the hospitalist on call if the hospitalist that took care of you is not available. Once you are discharged, your primary care physician will handle any further medical issues. Please note that no refills for any discharge medications will be authorized once you are discharged, as it is imperative that you return to your  primary care physician (or establish a relationship with a primary care physician if you do not have one) for your aftercare needs so that they can reassess your need for medications and monitor your lab values.   Procedures/Studies: DG Chest Port 1 View  Result Date: 11/28/2020 CLINICAL DATA:  Shortness of breath.  COVID positive. EXAM: PORTABLE CHEST 1 VIEW COMPARISON:  None. FINDINGS: Lungs symmetrically inflated. Minimal vague opacity in the lung bases. No confluent consolidation. No pneumothorax or pneumomediastinum. Heart is normal in size. Normal mediastinal contours allowing for rotation. No pleural fluid or pulmonary edema. No acute osseous abnormalities are seen. 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 IMPRESSION: Minimal vague opacity at the lung bases, may be atelectasis or COVID-19 pneumonia. Electronically Signed   By: Narda Rutherford M.D.   On: 11/28/2020 21:48      The results of significant diagnostics from this hospitalization (including imaging, microbiology, ancillary and laboratory) are listed below for reference.     Microbiology: Recent Results (from the past 240 hour(s))  Resp Panel by RT-PCR (Flu A&B, Covid) Nasopharyngeal Swab     Status: Abnormal   Collection  Time: 11/28/20  3:18 PM   Specimen: Nasopharyngeal Swab; Nasopharyngeal(NP) swabs in vial transport medium  Result Value Ref Range Status   SARS Coronavirus 2 by RT PCR POSITIVE (A) NEGATIVE Final    Comment: CRITICAL RESULT CALLED TO, READ BACK BY AND VERIFIED WITH: JOHN VANDRUFF 11/28/20 1658 KLW (NOTE) SARS-CoV-2 target nucleic acids are DETECTED.  The SARS-CoV-2 RNA is generally detectable in upper respiratory specimens during the acute phase of infection. Positive results are indicative of the presence of the identified virus, but do not rule out bacterial infection or co-infection with other pathogens not detected by the test. Clinical correlation with patient history and other  diagnostic information is necessary to determine patient infection status. The expected result is Negative.  Fact Sheet for Patients: BloggerCourse.com  Fact Sheet for Healthcare Providers: SeriousBroker.it  This test is not yet approved or cleared by the [place] FDA and  has been authorized for detection and/or diagnosis of SARS-CoV-2 by FDA under an Emergency Use Authorization (EUA).  This EUA will remain in effect (meaning this test  can be used) for the duration of  the COVID-19 declaration under Section 564(b)(1) of the Act, 21 U.S.C. section 360bbb-3(b)(1), unless the authorization is terminated or revoked sooner.     Influenza A by PCR NEGATIVE NEGATIVE Final   Influenza B by PCR NEGATIVE NEGATIVE Final    Comment: (NOTE) The Xpert Xpress SARS-CoV-2/FLU/RSV plus assay is intended as an aid in the diagnosis of influenza from Nasopharyngeal swab specimens and should not be used as a sole basis for treatment. Nasal washings and aspirates are unacceptable for Xpert Xpress SARS-CoV-2/FLU/RSV testing.  Fact Sheet for Patients: BloggerCourse.com  Fact Sheet for Healthcare Providers: SeriousBroker.it  This test is not yet approved or cleared by the [place] FDA and has been authorized for detection and/or diagnosis of SARS-CoV-2 by FDA under an Emergency Use Authorization (EUA). This EUA will remain in effect (meaning this test can be used) for the duration of the COVID-19 declaration under Section 564(b)(1) of the Act, 21 U.S.C. section 360bbb-3(b)(1), unless the authorization is terminated or revoked.  Performed at [facility], [address] [place], [place] [address]   Culture, blood (Routine X 2) w Reflex to ID Panel     Status: None   Collection Time: 11/28/20  9:11 PM   Specimen: BLOOD  Result Value Ref Range Status   Specimen Description BLOOD  BLOOD LEFT FOREARM  Final   Special Requests   Final    BOTTLES DRAWN AEROBIC AND ANAEROBIC Blood Culture results may not be optimal due to an inadequate volume of blood received in culture bottles   Culture   Final    NO GROWTH 5 DAYS Performed at [facility], [address] [place], [place] [address]    Report Status 12/03/2020 FINAL  Final  Culture, blood (Routine X 2) w Reflex to ID Panel     Status: None   Collection Time: 11/28/20  9:11 PM   Specimen: BLOOD  Result Value Ref Range Status   Specimen Description BLOOD BLOOD RIGHT HAND  Final   Special Requests   Final    BOTTLES DRAWN AEROBIC ONLY Blood Culture results may not be optimal due to an inadequate volume of blood received in culture bottles   Culture   Final    NO GROWTH 5 DAYS Performed at [facility], [address], [place], [place] [address]    Report Status 12/03/2020 FINAL  Final  Labs: BNP (last 3 results) Recent Labs    11/30/20 0400  BNP 190.1*   Basic Metabolic Panel: Recent Labs  Lab 11/29/20 0427 11/30/20 0400 12/01/20 0505 12/02/20 0532 12/03/20 0526 12/04/20 0504  NA 144 140 143 136 135 137  K 4.6 4.3 3.9 3.9 3.9 3.9  CL 116* 115* 113* 107 104 105  CO2 19* 17* 21* 22 24 25   GLUCOSE 128* 147* 104* 93 98 107*  BUN 72* 66* 45* 30* 25* 18  CREATININE 1.78* 1.86* 1.73* 1.61* 1.60* 1.32*  CALCIUM 8.5* 8.5* 8.6* 8.4* 8.5* 8.5*  MG 2.1 2.0 2.1 1.9 1.9 2.0  PHOS 3.8  --   --   --   --   --    Liver Function Tests: Recent Labs  Lab 11/28/20 1518 11/29/20 0427  AST 17 13*  ALT 7 8  ALKPHOS 40 35*  BILITOT 0.5 0.3  PROT 6.1* 5.5*  ALBUMIN 2.8* 2.6*   Recent Labs  Lab 11/28/20 1518  LIPASE 33   No results for input(s): AMMONIA in the last 168 hours. CBC: Recent Labs  Lab 11/28/20 1518 11/29/20 0052 11/29/20 0427 11/29/20 2233 11/30/20 0400 11/30/20 1550 12/01/20 0505 12/02/20 0532 12/03/20 0526 12/04/20 0504  WBC 11.9*  --  9.3  --  9.4  --  8.4  7.8 8.8 10.0  NEUTROABS 10.3*  --  7.5  --   --   --   --   --   --   --   HGB 5.6*   < > 7.1*   < > 5.0* 8.1* 7.5* 7.3* 7.6* 8.3*  HCT 19.8*   < > 22.5*   < > 16.0* 25.2* 23.1* 22.7* 23.5* 25.7*  MCV 85.7  --  86.2  --  88.4  --  88.5 89.4 90.0 91.1  PLT 247  --  171  --  155  --  143* 153 146* 153   < > = values in this interval not displayed.   Cardiac Enzymes: No results for input(s): CKTOTAL, CKMB, CKMBINDEX, TROPONINI in the last 168 hours. BNP: Invalid input(s): POCBNP CBG: No results for input(s): GLUCAP in the last 168 hours. D-Dimer No results for input(s): DDIMER in the last 72 hours. Hgb A1c No results for input(s): HGBA1C in the last 72 hours. Lipid Profile No results for input(s): CHOL, HDL, LDLCALC, TRIG, CHOLHDL, LDLDIRECT in the last 72 hours. Thyroid function studies No results for input(s): TSH, T4TOTAL, T3FREE, THYROIDAB in the last 72 hours.  Invalid input(s): FREET3 Anemia work up No results for input(s): VITAMINB12, FOLATE, FERRITIN, TIBC, IRON, RETICCTPCT in the last 72 hours. Urinalysis    Component Value Date/Time   COLORURINE STRAW (A) 11/29/2020 0646   APPEARANCEUR CLEAR (A) 11/29/2020 0646   APPEARANCEUR Hazy 04/28/2012 1747   LABSPEC 1.014 11/29/2020 0646   LABSPEC 1.016 04/28/2012 1747   PHURINE 5.0 11/29/2020 0646   GLUCOSEU NEGATIVE 11/29/2020 0646   GLUCOSEU Negative 04/28/2012 1747   HGBUR NEGATIVE 11/29/2020 0646   BILIRUBINUR NEGATIVE 11/29/2020 0646   BILIRUBINUR Negative 04/28/2012 1747   KETONESUR NEGATIVE 11/29/2020 0646   PROTEINUR NEGATIVE 11/29/2020 0646   NITRITE NEGATIVE 11/29/2020 0646   LEUKOCYTESUR NEGATIVE 11/29/2020 0646   LEUKOCYTESUR Trace 04/28/2012 1747   Sepsis Labs Invalid input(s): PROCALCITONIN,  WBC,  LACTICIDVEN Microbiology Recent Results (from the past 240 hour(s))  Resp Panel by RT-PCR (Flu A&B, Covid) Nasopharyngeal Swab     Status: Abnormal   Collection Time: 11/28/20  3:18 PM  Specimen:  Nasopharyngeal Swab; Nasopharyngeal(NP) swabs in vial transport medium  Result Value Ref Range Status   SARS Coronavirus 2 by RT PCR POSITIVE (A) NEGATIVE Final    Comment: CRITICAL RESULT CALLED TO, READ BACK BY AND VERIFIED WITH: JOHN VANDRUFF 11/28/20 1658 KLW (NOTE) SARS-CoV-2 target nucleic acids are DETECTED.  The SARS-CoV-2 RNA is generally detectable in upper respiratory specimens during the acute phase of infection. Positive results are indicative of the presence of the identified virus, but do not rule out bacterial infection or co-infection with other pathogens not detected by the test. Clinical correlation with patient history and other diagnostic information is necessary to determine patient infection status. The expected result is Negative.  Fact Sheet for Patients: BloggerCourse.com  Fact Sheet for Healthcare Providers: SeriousBroker.it  This test is not yet approved or cleared by the [place] FDA and  has been authorized for detection and/or diagnosis of SARS-CoV-2 by FDA under an Emergency Use Authorization (EUA).  This EUA will remain in effect (meaning this test  can be used) for the duration of  the COVID-19 declaration under Section 564(b)(1) of the Act, 21 U.S.C. section 360bbb-3(b)(1), unless the authorization is terminated or revoked sooner.     Influenza A by PCR NEGATIVE NEGATIVE Final   Influenza B by PCR NEGATIVE NEGATIVE Final    Comment: (NOTE) The Xpert Xpress SARS-CoV-2/FLU/RSV plus assay is intended as an aid in the diagnosis of influenza from Nasopharyngeal swab specimens and should not be used as a sole basis for treatment. Nasal washings and aspirates are unacceptable for Xpert Xpress SARS-CoV-2/FLU/RSV testing.  Fact Sheet for Patients: BloggerCourse.com  Fact Sheet for Healthcare Providers: SeriousBroker.it  This test is not yet  approved or cleared by the [place] FDA and has been authorized for detection and/or diagnosis of SARS-CoV-2 by FDA under an Emergency Use Authorization (EUA). This EUA will remain in effect (meaning this test can be used) for the duration of the COVID-19 declaration under Section 564(b)(1) of the Act, 21 U.S.C. section 360bbb-3(b)(1), unless the authorization is terminated or revoked.  Performed at [facility], [address] [place], [place] [address]   Culture, blood (Routine X 2) w Reflex to ID Panel     Status: None   Collection Time: 11/28/20  9:11 PM   Specimen: BLOOD  Result Value Ref Range Status   Specimen Description BLOOD BLOOD LEFT FOREARM  Final   Special Requests   Final    BOTTLES DRAWN AEROBIC AND ANAEROBIC Blood Culture results may not be optimal due to an inadequate volume of blood received in culture bottles   Culture   Final    NO GROWTH 5 DAYS Performed at [facility], [address] [place], [place] [address]    Report Status 12/03/2020 FINAL  Final  Culture, blood (Routine X 2) w Reflex to ID Panel     Status: None   Collection Time: 11/28/20  9:11 PM   Specimen: BLOOD  Result Value Ref Range Status   Specimen Description BLOOD BLOOD RIGHT HAND  Final   Special Requests   Final    BOTTLES DRAWN AEROBIC ONLY Blood Culture results may not be optimal due to an inadequate volume of blood received in culture bottles   Culture   Final    NO GROWTH 5 DAYS Performed at [facility], [address], [place], [place] [address]    Report Status 12/03/2020 FINAL  Final     Time coordinating discharge:  I have spent 35 minutes face to face with the patient and on the ward discussing the patients care, assessment, plan and disposition with other care givers. >50% of the time was devoted counseling the patient about the risks and benefits of treatment/Discharge disposition and coordinating care.   SIGNED:   Dimple Nanas,  MD  [facility] 12/04/2020, 11:27 AM   If 7PM-7AM, please contact night-coverage

## 2021-07-31 ENCOUNTER — Other Ambulatory Visit: Payer: Self-pay

## 2021-07-31 ENCOUNTER — Inpatient Hospital Stay
Admission: EM | Admit: 2021-07-31 | Discharge: 2021-08-06 | DRG: 377 | Disposition: A | Discharge status: Home / Self Care | Payer: Medicare HMO | Attending: Internal Medicine | Admitting: Internal Medicine

## 2021-07-31 ENCOUNTER — Emergency Department: Payer: Medicare HMO

## 2021-07-31 DIAGNOSIS — E872 Acidosis, unspecified: Secondary | ICD-10-CM | POA: Diagnosis present

## 2021-07-31 DIAGNOSIS — E876 Hypokalemia: Secondary | ICD-10-CM | POA: Diagnosis present

## 2021-07-31 DIAGNOSIS — R7989 Other specified abnormal findings of blood chemistry: Secondary | ICD-10-CM | POA: Diagnosis present

## 2021-07-31 DIAGNOSIS — K31811 Angiodysplasia of stomach and duodenum with bleeding: Principal | ICD-10-CM | POA: Diagnosis present

## 2021-07-31 DIAGNOSIS — I25118 Atherosclerotic heart disease of native coronary artery with other forms of angina pectoris: Secondary | ICD-10-CM | POA: Diagnosis not present

## 2021-07-31 DIAGNOSIS — R54 Age-related physical debility: Secondary | ICD-10-CM | POA: Diagnosis present

## 2021-07-31 DIAGNOSIS — E871 Hypo-osmolality and hyponatremia: Secondary | ICD-10-CM | POA: Diagnosis present

## 2021-07-31 DIAGNOSIS — D500 Iron deficiency anemia secondary to blood loss (chronic): Secondary | ICD-10-CM | POA: Diagnosis present

## 2021-07-31 DIAGNOSIS — D649 Anemia, unspecified: Secondary | ICD-10-CM | POA: Diagnosis present

## 2021-07-31 DIAGNOSIS — R531 Weakness: Secondary | ICD-10-CM | POA: Diagnosis not present

## 2021-07-31 DIAGNOSIS — I5023 Acute on chronic systolic (congestive) heart failure: Secondary | ICD-10-CM | POA: Diagnosis not present

## 2021-07-31 DIAGNOSIS — K7010 Alcoholic hepatitis without ascites: Secondary | ICD-10-CM | POA: Diagnosis present

## 2021-07-31 DIAGNOSIS — I426 Alcoholic cardiomyopathy: Secondary | ICD-10-CM | POA: Diagnosis present

## 2021-07-31 DIAGNOSIS — I5021 Acute systolic (congestive) heart failure: Secondary | ICD-10-CM

## 2021-07-31 DIAGNOSIS — R778 Other specified abnormalities of plasma proteins: Secondary | ICD-10-CM | POA: Diagnosis present

## 2021-07-31 DIAGNOSIS — F1010 Alcohol abuse, uncomplicated: Secondary | ICD-10-CM | POA: Diagnosis present

## 2021-07-31 DIAGNOSIS — I5043 Acute on chronic combined systolic (congestive) and diastolic (congestive) heart failure: Secondary | ICD-10-CM | POA: Diagnosis present

## 2021-07-31 DIAGNOSIS — I340 Nonrheumatic mitral (valve) insufficiency: Secondary | ICD-10-CM | POA: Diagnosis present

## 2021-07-31 DIAGNOSIS — Z8711 Personal history of peptic ulcer disease: Secondary | ICD-10-CM | POA: Diagnosis not present

## 2021-07-31 DIAGNOSIS — Z8719 Personal history of other diseases of the digestive system: Secondary | ICD-10-CM

## 2021-07-31 DIAGNOSIS — I248 Other forms of acute ischemic heart disease: Secondary | ICD-10-CM | POA: Diagnosis present

## 2021-07-31 DIAGNOSIS — R490 Dysphonia: Secondary | ICD-10-CM | POA: Diagnosis present

## 2021-07-31 DIAGNOSIS — Z8616 Personal history of COVID-19: Secondary | ICD-10-CM | POA: Diagnosis not present

## 2021-07-31 DIAGNOSIS — N189 Chronic kidney disease, unspecified: Secondary | ICD-10-CM | POA: Diagnosis present

## 2021-07-31 DIAGNOSIS — N1831 Chronic kidney disease, stage 3a: Secondary | ICD-10-CM | POA: Diagnosis present

## 2021-07-31 DIAGNOSIS — I420 Dilated cardiomyopathy: Secondary | ICD-10-CM | POA: Diagnosis present

## 2021-07-31 DIAGNOSIS — N179 Acute kidney failure, unspecified: Secondary | ICD-10-CM | POA: Diagnosis present

## 2021-07-31 DIAGNOSIS — Z79899 Other long term (current) drug therapy: Secondary | ICD-10-CM

## 2021-07-31 DIAGNOSIS — Z20822 Contact with and (suspected) exposure to covid-19: Secondary | ICD-10-CM | POA: Diagnosis present

## 2021-07-31 HISTORY — DX: Diaphragmatic hernia without obstruction or gangrene: K44.9

## 2021-07-31 HISTORY — DX: Iron deficiency anemia, unspecified: D50.9

## 2021-07-31 HISTORY — DX: Chronic kidney disease, stage 3 unspecified: N18.30

## 2021-07-31 HISTORY — DX: Peptic ulcer, site unspecified, unspecified as acute or chronic, without hemorrhage or perforation: K27.9

## 2021-07-31 HISTORY — DX: Gastritis, unspecified, without bleeding: K29.70

## 2021-07-31 LAB — LIPASE, BLOOD: Lipase: 43 U/L (ref 11–51)

## 2021-07-31 LAB — COMPREHENSIVE METABOLIC PANEL
ALT: 123 U/L — ABNORMAL HIGH (ref 0–44)
AST: 130 U/L — ABNORMAL HIGH (ref 15–41)
Albumin: 3.6 g/dL (ref 3.5–5.0)
Alkaline Phosphatase: 104 U/L (ref 38–126)
Anion gap: 11 (ref 5–15)
BUN: 36 mg/dL — ABNORMAL HIGH (ref 8–23)
CO2: 17 mmol/L — ABNORMAL LOW (ref 22–32)
Calcium: 8.7 mg/dL — ABNORMAL LOW (ref 8.9–10.3)
Chloride: 105 mmol/L (ref 98–111)
Creatinine, Ser: 1.92 mg/dL — ABNORMAL HIGH (ref 0.61–1.24)
GFR, Estimated: 38 mL/min — ABNORMAL LOW (ref >60)
Glucose, Bld: 109 mg/dL — ABNORMAL HIGH (ref 70–99)
Potassium: 4.6 mmol/L (ref 3.5–5.1)
Sodium: 133 mmol/L — ABNORMAL LOW (ref 135–145)
Total Bilirubin: 1.4 mg/dL — ABNORMAL HIGH (ref 0.3–1.2)
Total Protein: 7.5 g/dL (ref 6.5–8.1)

## 2021-07-31 LAB — CBC WITH DIFFERENTIAL/PLATELET
Abs Immature Granulocytes: 0.08 10*3/uL — ABNORMAL HIGH (ref 0.00–0.07)
Basophils Absolute: 0 10*3/uL (ref 0.0–0.1)
Basophils Relative: 0 %
Eosinophils Absolute: 0.1 10*3/uL (ref 0.0–0.5)
Eosinophils Relative: 1 %
HCT: 19.3 % — ABNORMAL LOW (ref 39.0–52.0)
Hemoglobin: 5 g/dL — ABNORMAL LOW (ref 13.0–17.0)
Immature Granulocytes: 1 %
Lymphocytes Relative: 9 %
Lymphs Abs: 0.9 10*3/uL (ref 0.7–4.0)
MCH: 20.3 pg — ABNORMAL LOW (ref 26.0–34.0)
MCHC: 25.9 g/dL — ABNORMAL LOW (ref 30.0–36.0)
MCV: 78.5 fL — ABNORMAL LOW (ref 80.0–100.0)
Monocytes Absolute: 0.7 10*3/uL (ref 0.1–1.0)
Monocytes Relative: 8 %
Neutro Abs: 7.7 10*3/uL (ref 1.7–7.7)
Neutrophils Relative %: 81 %
Platelets: 181 10*3/uL (ref 150–400)
RBC: 2.46 MIL/uL — ABNORMAL LOW (ref 4.22–5.81)
RDW: 21.3 % — ABNORMAL HIGH (ref 11.5–15.5)
Smear Review: NORMAL
WBC: 9.5 10*3/uL (ref 4.0–10.5)
nRBC: 0.8 % — ABNORMAL HIGH (ref 0.0–0.2)

## 2021-07-31 LAB — RESP PANEL BY RT-PCR (FLU A&B, COVID) ARPGX2
Influenza A by PCR: NEGATIVE
Influenza B by PCR: NEGATIVE
SARS Coronavirus 2 by RT PCR: NEGATIVE

## 2021-07-31 LAB — TROPONIN I (HIGH SENSITIVITY): Troponin I (High Sensitivity): 152 ng/L (ref <18)

## 2021-07-31 LAB — SAMPLE TO BLOOD BANK

## 2021-07-31 LAB — LACTIC ACID, PLASMA: Lactic Acid, Venous: 2.7 mmol/L (ref 0.5–1.9)

## 2021-07-31 LAB — PREPARE RBC (CROSSMATCH)

## 2021-07-31 MED ORDER — THIAMINE HCL 100 MG/ML IJ SOLN
100.0000 mg | Freq: Once | INTRAMUSCULAR | Status: AC
  Administered 2021-08-01: 02:00:00 100 mg via INTRAVENOUS
  Filled 2021-07-31: qty 2

## 2021-07-31 MED ORDER — SODIUM CHLORIDE 0.9 % IV SOLN
10.0000 mL/h | Freq: Once | INTRAVENOUS | Status: AC
  Administered 2021-08-01: 02:00:00 10 mL/h via INTRAVENOUS

## 2021-07-31 NOTE — H&P (Signed)
History and Physical   John Escobar KGM:[id number] DOB: 01/20/1956 DOA: 07/31/2021  Referring MD/NP/PA: Dr. Juliette Alcide  PCP: [facility]   Outpatient Specialists: Dr. Norma Fredrickson, GI  Patient coming from: Home  Chief Complaint: Generalized weakness  HPI: John Escobar is a 65 y.o. male with medical history significant of alcohol abuse, history of GI bleed with multiple ulcers back in February, COVID-19 infection in February, chronic anemia who presented to the ER with progressive weakness over the last couple of weeks.  Patient has also noted worsening lower extremity edema and some exertional dyspnea.  Denied any black stools or bright red blood per rectum.  Denied any nausea vomiting or abdominal pain.  He is just getting weak.  Patient continues to drink alcohol but said he is cut back.  He drinks about 1 beer a day.  His last drink was this morning.  Patient seen in the ER with markedly lower extremity edema bilaterally but hemoglobin is down to 5.0.  His last hemoglobin after discharge in February was 8.3.  The MCV is 70 indicating iron deficiency.  Patient suspected to have chronic blood loss with symptomatic anemia.  Being admitted for further work-up..  ED Course: Temperature is 98.1 blood pressure 112/82, pulse 110 respiratory 23 oxygen sat 99% on room air.  Lactic acid 2.7 troponin 152.  White count 9.5 hemoglobin 5.0 and platelet count of 181.  Sodium 133 potassium 4.6 chloride 105 CO2 17 BUN 36 creatinine 1.91 calcium 8.7.  Patient being admitted for symptomatic anemia and demand ischemia.  Review of Systems: As per HPI otherwise 10 point review of systems negative.    Past Medical History:  Diagnosis Date   ETOH abuse     Past Surgical History:  Procedure Laterality Date   ELBOW BURSA SURGERY     ESOPHAGOGASTRODUODENOSCOPY (EGD) WITH PROPOFOL N/A 11/29/2020   Procedure: ESOPHAGOGASTRODUODENOSCOPY (EGD) WITH PROPOFOL;  Surgeon: Toledo, Boykin Nearing, MD;   Location: [facility] ENDOSCOPY;  Service: Gastroenterology;  Laterality: N/A;   SHOULDER SURGERY Left      reports that he has never smoked. He has never used smokeless tobacco. He reports current alcohol use. He reports that he does not currently use drugs.  No Known Allergies  No family history on file.   Prior to Admission medications   Medication Sig Start Date End Date Taking? Authorizing Provider  albuterol (VENTOLIN HFA) 108 (90 Base) MCG/ACT inhaler Inhale 1-2 puffs into the lungs every 4 (four) hours as needed for wheezing or shortness of breath. 12/04/20   Amin, Loura Halt, MD  ferrous sulfate 325 (65 FE) MG tablet Take 1 tablet (325 mg total) by mouth 2 (two) times daily with a meal. 12/04/20   Amin, Loura Halt, MD  folic acid (FOLVITE) 1 MG tablet Take 1 tablet (1 mg total) by mouth daily. 12/04/20   Dimple Nanas, MD  Multiple Vitamin (MULTIVITAMIN WITH MINERALS) TABS tablet Take 1 tablet by mouth daily. 12/04/20   Amin, Loura Halt, MD  pantoprazole (PROTONIX) 40 MG tablet Take 1 tablet (40 mg total) by mouth 2 (two) times daily before a meal. 12/04/20   Amin, Loura Halt, MD  senna-docusate (SENOKOT-S) 8.6-50 MG tablet Take 1 tablet by mouth at bedtime as needed for moderate constipation. 12/04/20   Amin, Loura Halt, MD  thiamine 100 MG tablet Take 1 tablet (100 mg total) by mouth daily. 12/04/20   Dimple Nanas, MD    Physical Exam: Vitals:   07/31/21 2148  07/31/21 2200 07/31/21 2205 07/31/21 2215  BP: 112/86 113/85  (!) 113/93  Pulse:    98  Resp: 20 19    Temp: 97.9 F (36.6 C) 98.1 F (36.7 C) 97.9 F (36.6 C)   TempSrc: Oral Oral    SpO2: 99% 100%  100%  Weight:      Height:          Constitutional: Chronically ill looking, malnourished v Vitals:   07/31/21 2148 07/31/21 2200 07/31/21 2205 07/31/21 2215  BP: 112/86 113/85  (!) 113/93  Pulse:    98  Resp: 20 19    Temp: 97.9 F (36.6 C) 98.1 F (36.7 C) 97.9 F (36.6 C)   TempSrc: Oral Oral     SpO2: 99% 100%  100%  Weight:      Height:       Eyes: PERRL, lids and conjunctivae pale ENMT: Mucous membranes are moist. Posterior pharynx clear of any exudate or lesions.Normal dentition.  Neck: normal, supple, no masses, no thyromegaly Respiratory: clear to auscultation bilaterally, no wheezing, no crackles. Normal respiratory effort. No accessory muscle use.  Cardiovascular: Sinus tachycardia, no murmurs / rubs / gallops.  2+ extremity edema, . 2+ pedal pulses. No carotid bruits.  Abdomen: no tenderness, no masses palpated. No hepatosplenomegaly. Bowel sounds positive.  Musculoskeletal: no clubbing / cyanosis. No joint deformity upper and lower extremities. Good ROM, no contractures. Normal muscle tone.  Skin: Pale, dry no rashes, lesions, ulcers. No induration Neurologic: CN 2-12 grossly intact. Sensation intact, DTR normal. Strength 5/5 in all 4.  Psychiatric: Normal judgment and insight. Alert and oriented x 3. Normal mood.     Labs on Admission: I have personally reviewed following labs and imaging studies  CBC: Recent Labs  Lab 07/31/21 1911  WBC 9.5  NEUTROABS 7.7  HGB 5.0*  HCT 19.3*  MCV 78.5*  PLT 181   Basic Metabolic Panel: Recent Labs  Lab 07/31/21 1911  NA 133*  K 4.6  CL 105  CO2 17*  GLUCOSE 109*  BUN 36*  CREATININE 1.92*  CALCIUM 8.7*   GFR: Estimated Creatinine Clearance: 34.5 mL/min (A) (by C-G formula based on SCr of 1.92 mg/dL (H)). Liver Function Tests: Recent Labs  Lab 07/31/21 1911  AST 130*  ALT 123*  ALKPHOS 104  BILITOT 1.4*  PROT 7.5  ALBUMIN 3.6   Recent Labs  Lab 07/31/21 1911  LIPASE 43   No results for input(s): AMMONIA in the last 168 hours. Coagulation Profile: No results for input(s): INR, PROTIME in the last 168 hours. Cardiac Enzymes: No results for input(s): CKTOTAL, CKMB, CKMBINDEX, TROPONINI in the last 168 hours. BNP (last 3 results) No results for input(s): PROBNP in the last 8760 hours. HbA1C: No  results for input(s): HGBA1C in the last 72 hours. CBG: No results for input(s): GLUCAP in the last 168 hours. Lipid Profile: No results for input(s): CHOL, HDL, LDLCALC, TRIG, CHOLHDL, LDLDIRECT in the last 72 hours. Thyroid Function Tests: No results for input(s): TSH, T4TOTAL, FREET4, T3FREE, THYROIDAB in the last 72 hours. Anemia Panel: No results for input(s): VITAMINB12, FOLATE, FERRITIN, TIBC, IRON, RETICCTPCT in the last 72 hours. Urine analysis:    Component Value Date/Time   COLORURINE STRAW (A) 11/29/2020 0646   APPEARANCEUR CLEAR (A) 11/29/2020 0646   APPEARANCEUR Hazy 04/28/2012 1747   LABSPEC 1.014 11/29/2020 0646   LABSPEC 1.016 04/28/2012 1747   PHURINE 5.0 11/29/2020 0646   GLUCOSEU NEGATIVE 11/29/2020 0646   GLUCOSEU Negative 04/28/2012  1747   HGBUR NEGATIVE 11/29/2020 0646   BILIRUBINUR NEGATIVE 11/29/2020 0646   BILIRUBINUR Negative 04/28/2012 1747   KETONESUR NEGATIVE 11/29/2020 0646   PROTEINUR NEGATIVE 11/29/2020 0646   NITRITE NEGATIVE 11/29/2020 0646   LEUKOCYTESUR NEGATIVE 11/29/2020 0646   LEUKOCYTESUR Trace 04/28/2012 1747   Sepsis Labs: @LABRCNTIP (procalcitonin:4,lacticidven:4) )No results found for this or any previous visit (from the past 240 hour(s)).   Radiological Exams on Admission: DG Elbow 2 Views Right  Result Date: 07/31/2021 CLINICAL DATA:  Right elbow pain, large mass posterior elbow EXAM: RIGHT ELBOW - 2 VIEW COMPARISON:  None. FINDINGS: Large soft tissue mass noted posteriorly with areas of internal calcification. This likely reflects enlarged bursa. No acute bony abnormality. Specifically, no fracture, subluxation, or dislocation. IMPRESSION: No acute bony abnormality. Enlarged bursa posterior to the right elbow. Electronically Signed   By: 08/02/2021 M.D.   On: 07/31/2021 20:39   DG Chest Portable 1 View  Result Date: 07/31/2021 CLINICAL DATA:  Shortness of breath EXAM: PORTABLE CHEST 1 VIEW COMPARISON:  11/28/2020 FINDINGS:  Heart is upper limits normal in size. No confluent airspace opacities or effusions. No acute bony abnormality. IMPRESSION: No active disease. Electronically Signed   By: 11/30/2020 M.D.   On: 07/31/2021 20:38    EKG: Independently reviewed.  Sinus tachycardia otherwise no significant findings  Assessment/Plan Principal Problem:   Symptomatic anemia Active Problems:   Generalized weakness   Lactic acidosis   Chronic alcohol abuse   Hyponatremia   AKI (acute kidney injury) (HCC)   Troponin I above reference range     #1 symptomatic anemia: Most likely chronic GI loss.  Patient is guaiac negative today.  Patient will be admitted.  IV Protonix.  Transfuse 2 units of packed red blood cells and follow.  No active GI bleed it appears.  More than likely patient will get better with increase RBC.  #2 bilateral lower extremity edema: Most likely secondary to symptomatic anemia.  Patient already has AKI indication for possible intravascular contraction.  Avoid diuretics or more fluids start with transfusion and when renal function improves may diurese to get rid of excess fluids.  #3 AKI: Previous creatinine was 1.3.  Currently 1.92.  Most likely due to demand.  We will transfuse again expand the volume and then follow renal function.  #4 lactic acidosis: Again may reflect volume contraction intravascularly.  Monitor lactic acid level.  No evidence of sepsis otherwise.  #5 chronic alcohol abuse: No evidence of withdrawals.  Monitor closely and if any sign is seen initiate CIWA protocol.  #6 elevated troponins: Probably from a demand ischemia secondary to anemia.  We will trend the troponins and monitor.  #7 generalized weakness: Secondary to anemia and chronic disease.  Will require PT OT prior to discharge   DVT prophylaxis: SCD Code Status: Full code Family Communication: No family at bedside Disposition Plan: Home Consults called: None but may require GI consult if guaiac  positive Admission status: Inpatient  Severity of Illness: The appropriate patient status for this patient is INPATIENT. Inpatient status is judged to be reasonable and necessary in order to provide the required intensity of service to ensure the patient's safety. The patient's presenting symptoms, physical exam findings, and initial radiographic and laboratory data in the context of their chronic comorbidities is felt to place them at high risk for further clinical deterioration. Furthermore, it is not anticipated that the patient will be medically stable for discharge from the hospital within 2 midnights of  admission.   * I certify that at the point of admission it is my clinical judgment that the patient will require inpatient hospital care spanning beyond 2 midnights from the point of admission due to high intensity of service, high risk for further deterioration and high frequency of surveillance required.Lonia Blood MD [facility] Pager [phone]  If 7PM-7AM, please contact night-coverage [email or web address] Password [email or web address]  07/31/2021, 10:26 PM

## 2021-07-31 NOTE — ED Triage Notes (Signed)
Pt states that he is having increasingly extreme weakness with nausea. Pt arrives via ems from home, pt states just the smallest amount of activity exhausts him, pt is very pale, and states that his stools have been normal color and very small amounts at a time that is painful to come out

## 2021-07-31 NOTE — ED Notes (Signed)
Lab is sending someone to draw the rest of the blood work.

## 2021-07-31 NOTE — ED Notes (Signed)
Pt given urinal per request.

## 2021-07-31 NOTE — ED Notes (Signed)
First nurse note: pt comes ems with abd pain for 2 months. States worse within the past few days. Worse with eating. VSS. Aox4.

## 2021-07-31 NOTE — ED Notes (Signed)
Reviewed pt's results and cc; acuity level changed; pt taken to room 17 and placed in hosp gown & on card monitor; Dr Darnelle Catalan and care nurse Marcelino Duster notified

## 2021-07-31 NOTE — ED Provider Notes (Signed)
[facility] Emergency Department Provider Note   ____________________________________________   Event Date/Time   First MD Initiated Contact with Patient 07/31/21 2001     (approximate)  I have reviewed the triage vital signs and the nursing notes.   HISTORY  Chief Complaint Weakness    HPI John Escobar is a 65 y.o. male patient reports he is very weak and is becoming weaker.  He gets very short of breath quickly.  He denies any blood per rectum or black tarry stools.  He says stooling is painful.  He tells me he has had an endoscopy recently where they found ulcers.  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  He was given PPIs transfusion which he does not remember getting and Protonix.  Patient does remember is taking iron.  Endoscopy was done in February.  His hemoglobin was 5 remained stable afterwards at about 7.  Back down to 5 now.  Patient has some scattered crackles in his lungs and edema also.  Patient has a history of lactic acidosis.  Patient has a history of alcohol abuse as noted below.        Past Medical History:  Diagnosis Date   ETOH abuse     Patient Active Problem List   Diagnosis Date Noted   Symptomatic anemia 07/31/2021   Hyponatremia 07/31/2021   AKI (acute kidney injury) (HCC) 07/31/2021   Troponin I above reference range 07/31/2021   Acute anemia 11/28/2020   Generalized weakness 11/28/2020   Elevated serum creatinine 11/28/2020   COVID-19 virus infection 11/28/2020   Lactic acidosis 11/28/2020   Severe sepsis (HCC) 11/28/2020   Chronic alcohol abuse 11/28/2020    Past Surgical History:  Procedure Laterality Date   ELBOW BURSA SURGERY     ESOPHAGOGASTRODUODENOSCOPY (EGD) WITH PROPOFOL N/A 11/29/2020   Procedure: ESOPHAGOGASTRODUODENOSCOPY (EGD) WITH PROPOFOL;  Surgeon: Toledo, Boykin Nearing,  MD;  Location: [facility] ENDOSCOPY;  Service: Gastroenterology;  Laterality: N/A;   SHOULDER SURGERY Left     Prior to Admission medications   Medication Sig Start Date End Date Taking? Authorizing Provider  Aspirin-Acetaminophen-Caffeine (GOODY HEADACHE PO) Take 1 Package by mouth daily as needed.   Yes [provider]  bismuth subsalicylate (PEPTO BISMOL) 262 MG/15ML suspension Take 30 mLs by mouth every 6 (six) hours as needed.   Yes [provider]  albuterol (VENTOLIN HFA) 108 (90 Base) MCG/ACT inhaler Inhale 1-2 puffs into the lungs every 4 (four) hours as needed for wheezing or shortness of breath. Patient not taking: Reported on 07/31/2021 12/04/20   Dimple Nanas, MD  ferrous sulfate 325 (65 FE) MG tablet Take 1 tablet (325 mg total) by mouth 2 (two) times daily with a meal. Patient not taking: Reported on 07/31/2021 12/04/20   Dimple Nanas, MD  folic acid (FOLVITE) 1 MG tablet Take 1 tablet (1 mg total) by mouth daily. Patient not taking: Reported on 07/31/2021 12/04/20   Dimple Nanas, MD  Multiple Vitamin (MULTIVITAMIN WITH MINERALS) TABS tablet Take 1 tablet by mouth daily. Patient not taking: Reported on 07/31/2021 12/04/20   Dimple Nanas, MD  pantoprazole (PROTONIX) 40 MG tablet Take 1 tablet (40 mg total) by mouth 2 (two) times daily before a meal. Patient not taking: Reported on 07/31/2021 12/04/20   Dimple Nanas, MD  senna-docusate (SENOKOT-S) 8.6-50 MG tablet Take 1 tablet by mouth at  bedtime as needed for moderate constipation. Patient not taking: Reported on 07/31/2021 12/04/20   Dimple Nanas, MD  thiamine 100 MG tablet Take 1 tablet (100 mg total) by mouth daily. Patient not taking: Reported on 07/31/2021 12/04/20   Dimple Nanas, MD    Allergies Patient has no known allergies.  No family history on file.  Social History Social History   Tobacco Use   Smoking status: Never   Smokeless tobacco: Never  Substance Use  Topics   Alcohol use: Yes    Comment: 12 oz beers x 3 / day   Drug use: Not Currently    Review of Systems  Constitutional: No fever/chills Eyes: No visual changes. ENT: No sore throat. Cardiovascular: Denies chest pain. Respiratory: shortness of breath. Gastrointestinal: No abdominal pain.  No nausea, no vomiting.  No diarrhea.  No constipation. Genitourinary: Negative for dysuria. Musculoskeletal: Negative for back pain. Skin: Negative for rash. Neurological: Negative for headaches, focal weakness  ____________________________________________   PHYSICAL EXAM:  VITAL SIGNS: ED Triage Vitals  Enc Vitals Group     BP 07/31/21 1901 120/86     Pulse Rate 07/31/21 1901 (!) 110     Resp 07/31/21 1901 16     Temp 07/31/21 1901 98.1 F (36.7 C)     Temp Source 07/31/21 1901 Oral     SpO2 07/31/21 1901 100 %     Weight 07/31/21 1903 140 lb (63.5 kg)     Height 07/31/21 1903 5\' 9"  (1.753 m)     Head Circumference --      Peak Flow --      Pain Score 07/31/21 1902 10     Pain Loc --      Pain Edu? --      Excl. in GC? --     Constitutional: Alert and oriented.  Chronically ill and very thin appearing and in no acute distress. Eyes: Conjunctivae are normal. PER Head: Atraumatic. Nose: No congestion/rhinnorhea. Mouth/Throat: Mucous membranes are pale and moist.  Oropharynx is pale Neck: No stridor.  Cardiovascular: Normal rate, regular rhythm. Grossly normal heart sounds.  Good peripheral circulation. Respiratory: Normal respiratory effort.  No retractions. Lungs scattered crackles Gastrointestinal: Soft and nontender. No distention. No abdominal bruits.  usculoskeletal: No lower extremity tenderness 2+ bilateral edema.  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 Neurologic:  Normal speech and language. No gross focal neurologic deficits are appreciated. No gait instability. Skin:  Skin is warm, dry and intact. No rash noted.   Pale   ____________________________________________   LABS (all labs ordered are listed, but only abnormal results are displayed)  Labs Reviewed  COMPREHENSIVE METABOLIC PANEL - Abnormal; Notable for the following components:      Result Value   Sodium 133 (*)    CO2 17 (*)    Glucose, Bld 109 (*)    BUN 36 (*)    Creatinine, Ser 1.92 (*)    Calcium 8.7 (*)    AST 130 (*)    ALT 123 (*)    Total Bilirubin 1.4 (*)    GFR, Estimated 38 (*)    All other components within normal limits  LACTIC ACID, PLASMA - Abnormal; Notable for the following components:   Lactic Acid, Venous 2.7 (*)    All other components within normal limits  CBC WITH DIFFERENTIAL/PLATELET - Abnormal; Notable for the following components:   RBC 2.46 (*)    Hemoglobin 5.0 (*)  HCT 19.3 (*)    MCV 78.5 (*)    MCH 20.3 (*)    MCHC 25.9 (*)    RDW 21.3 (*)    nRBC 0.8 (*)    Abs Immature Granulocytes 0.08 (*)    All other components within normal limits  TROPONIN I (HIGH SENSITIVITY) - Abnormal; Notable for the following components:   Troponin I (High Sensitivity) 152 (*)    All other components within normal limits  RESP PANEL BY RT-PCR (FLU A&B, COVID) ARPGX2  LIPASE, BLOOD  LACTIC ACID, PLASMA  URINALYSIS, ROUTINE W REFLEX MICROSCOPIC  IRON AND TIBC  FERRITIN  FOLATE RBC  VITAMIN B12  BRAIN NATRIURETIC PEPTIDE  URIC ACID  BLOOD GAS, VENOUS  SAMPLE TO BLOOD BANK  PREPARE RBC (CROSSMATCH)  TYPE AND SCREEN  TROPONIN I (HIGH SENSITIVITY)   ____________________________________________  EKG  EKG read interpreted by me shows sinus tachycardia at a rate of 109, 0 axis patient has inverted T waves in 1 and L ST segment depression in 2 3 and F and ST segment downsloping T wave inversion in V4 through 6.  All these changes are new from February of this year. ____________________________________________  RADIOLOGY Jill Poling, personally viewed and evaluated these images (plain radiographs)  as part of my medical decision making, as well as reviewing the written report by the radiologist.  ED MD interpretation: Chest x-ray read by radiology as no acute disease I reviewed the film.  Elbow x-ray read is enlarged versus posterior.  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  Official radiology report(s): DG Elbow 2 Views Right  Result Date: 07/31/2021 CLINICAL DATA:  Right elbow pain, large mass posterior elbow EXAM: RIGHT ELBOW - 2 VIEW COMPARISON:  None. FINDINGS: Large soft tissue mass noted posteriorly with areas of internal calcification. This likely reflects enlarged bursa. No acute bony abnormality. Specifically, no fracture, subluxation, or dislocation. IMPRESSION: No acute bony abnormality. Enlarged bursa posterior to the right elbow. Electronically Signed   By: Charlett Nose M.D.   On: 07/31/2021 20:39   DG Chest Portable 1 View  Result Date: 07/31/2021 CLINICAL DATA:  Shortness of breath EXAM: PORTABLE CHEST 1 VIEW COMPARISON:  11/28/2020 FINDINGS: Heart is upper limits normal in size. No confluent airspace opacities or effusions. No acute bony abnormality. IMPRESSION: No active disease. Electronically Signed   By: Charlett Nose M.D.   On: 07/31/2021 20:38    ____________________________________________   PROCEDURES  Procedure(s) performed (including Critical Care): Critical care time 45 minutes.  This includes talking to the patient reviewing his old records in detail looking for evidence of gout which is present in the old record reviewing his old EKGs and other old studies and in speaking with the hospitalist.  Procedures   ____________________________________________   INITIAL IMPRESSION / ASSESSMENT AND PLAN / ED COURSE  I reviewed the x-rays and EKG done tonight and compared them with prior.  Additionally I reviewed his blood work.  I obtained the consent for the transfusion for the patient.   I believe that many or all of the patient's symptoms may be due to his anemia.  He had the endoscopy which I also reviewed.  Which showed ulcers and vascular telangiectasia.  He could easily have had developed more of these.  He is certainly as anemic as he was in his very short of breath when he  moves.  He has a history of lactic acidosis.  He does not seem to have any other reason for like infection.  He is very thin and has a history of alcoholism possibly he has some lactic acidosis from inanition.  He does not admit to this however.  As I discussed with the hospitalist I will begin by treating his anemia.  I do not want to put him on any blood thinners with his anemia being so severe in the history of problems as documented by his previous endoscopy.  We will see how things develop.  We will change her treatment plan accordingly.  Second lactate, second troponin, venous blood gas and uric acid which were ordered several hours ago have not been completed.  This would help assessing his management.              ____________________________________________   FINAL CLINICAL IMPRESSION(S) / ED DIAGNOSES  Final diagnoses:  Generalized weakness  Symptomatic anemia  Elevated lactic acid level     ED Discharge Orders     None        Note:  This document was prepared using Dragon voice recognition software and may include unintentional dictation errors.    Arnaldo Natal, MD 07/31/21 [phone]

## 2021-08-01 ENCOUNTER — Encounter: Payer: Self-pay | Admitting: Internal Medicine

## 2021-08-01 ENCOUNTER — Inpatient Hospital Stay (HOSPITAL_COMMUNITY)
Admit: 2021-08-01 | Discharge: 2021-08-01 | Disposition: A | Discharge status: Home / Self Care | Payer: Medicare HMO | Attending: Internal Medicine | Admitting: Internal Medicine

## 2021-08-01 DIAGNOSIS — I5021 Acute systolic (congestive) heart failure: Secondary | ICD-10-CM

## 2021-08-01 DIAGNOSIS — N179 Acute kidney failure, unspecified: Secondary | ICD-10-CM

## 2021-08-01 DIAGNOSIS — F1010 Alcohol abuse, uncomplicated: Secondary | ICD-10-CM

## 2021-08-01 DIAGNOSIS — D500 Iron deficiency anemia secondary to blood loss (chronic): Secondary | ICD-10-CM

## 2021-08-01 DIAGNOSIS — R531 Weakness: Secondary | ICD-10-CM | POA: Diagnosis not present

## 2021-08-01 DIAGNOSIS — I5023 Acute on chronic systolic (congestive) heart failure: Secondary | ICD-10-CM

## 2021-08-01 DIAGNOSIS — I25118 Atherosclerotic heart disease of native coronary artery with other forms of angina pectoris: Secondary | ICD-10-CM

## 2021-08-01 DIAGNOSIS — D649 Anemia, unspecified: Secondary | ICD-10-CM

## 2021-08-01 DIAGNOSIS — R7989 Other specified abnormal findings of blood chemistry: Secondary | ICD-10-CM

## 2021-08-01 DIAGNOSIS — R778 Other specified abnormalities of plasma proteins: Secondary | ICD-10-CM

## 2021-08-01 DIAGNOSIS — I420 Dilated cardiomyopathy: Secondary | ICD-10-CM

## 2021-08-01 LAB — ECHOCARDIOGRAM COMPLETE
AR max vel: 1.6 cm2
AV Area VTI: 1.75 cm2
AV Area mean vel: 1.54 cm2
AV Mean grad: 2 mmHg
AV Peak grad: 3.1 mmHg
Ao pk vel: 0.88 m/s
Area-P 1/2: 6.07 cm2
Calc EF: 17.7 %
Height: 69 in
S' Lateral: 5.1 cm
Single Plane A2C EF: 23.2 %
Single Plane A4C EF: 14.6 %
Weight: 2352.75 oz

## 2021-08-01 LAB — HEPATITIS PANEL, ACUTE
HCV Ab: NONREACTIVE
Hep A IgM: NONREACTIVE
Hep B C IgM: NONREACTIVE
Hepatitis B Surface Ag: NONREACTIVE

## 2021-08-01 LAB — CBC
HCT: 25.9 % — ABNORMAL LOW (ref 39.0–52.0)
Hemoglobin: 8 g/dL — ABNORMAL LOW (ref 13.0–17.0)
MCH: 24.5 pg — ABNORMAL LOW (ref 26.0–34.0)
MCHC: 30.9 g/dL (ref 30.0–36.0)
MCV: 79.4 fL — ABNORMAL LOW (ref 80.0–100.0)
Platelets: 151 10*3/uL (ref 150–400)
RBC: 3.26 MIL/uL — ABNORMAL LOW (ref 4.22–5.81)
RDW: 19.7 % — ABNORMAL HIGH (ref 11.5–15.5)
WBC: 8.4 10*3/uL (ref 4.0–10.5)
nRBC: 1.7 % — ABNORMAL HIGH (ref 0.0–0.2)

## 2021-08-01 LAB — BLOOD GAS, VENOUS
Acid-base deficit: 7.6 mmol/L — ABNORMAL HIGH (ref 0.0–2.0)
Bicarbonate: 16.6 mmol/L — ABNORMAL LOW (ref 20.0–28.0)
O2 Saturation: 85.6 %
Patient temperature: 37
pCO2, Ven: 28 mmHg — ABNORMAL LOW (ref 44.0–60.0)
pH, Ven: 7.38 (ref 7.250–7.430)
pO2, Ven: 52 mmHg — ABNORMAL HIGH (ref 32.0–45.0)

## 2021-08-01 LAB — IRON AND TIBC
Iron: 34 ug/dL — ABNORMAL LOW (ref 45–182)
Saturation Ratios: 6 % — ABNORMAL LOW (ref 17.9–39.5)
TIBC: 585 ug/dL — ABNORMAL HIGH (ref 250–450)
UIBC: 551 ug/dL

## 2021-08-01 LAB — TROPONIN I (HIGH SENSITIVITY): Troponin I (High Sensitivity): 118 ng/L (ref <18)

## 2021-08-01 LAB — COMPREHENSIVE METABOLIC PANEL
ALT: 123 U/L — ABNORMAL HIGH (ref 0–44)
AST: 117 U/L — ABNORMAL HIGH (ref 15–41)
Albumin: 3.2 g/dL — ABNORMAL LOW (ref 3.5–5.0)
Alkaline Phosphatase: 95 U/L (ref 38–126)
Anion gap: 14 (ref 5–15)
BUN: 35 mg/dL — ABNORMAL HIGH (ref 8–23)
CO2: 16 mmol/L — ABNORMAL LOW (ref 22–32)
Calcium: 8.7 mg/dL — ABNORMAL LOW (ref 8.9–10.3)
Chloride: 101 mmol/L (ref 98–111)
Creatinine, Ser: 1.84 mg/dL — ABNORMAL HIGH (ref 0.61–1.24)
GFR, Estimated: 40 mL/min — ABNORMAL LOW (ref >60)
Glucose, Bld: 101 mg/dL — ABNORMAL HIGH (ref 70–99)
Potassium: 4.4 mmol/L (ref 3.5–5.1)
Sodium: 131 mmol/L — ABNORMAL LOW (ref 135–145)
Total Bilirubin: 2.7 mg/dL — ABNORMAL HIGH (ref 0.3–1.2)
Total Protein: 6.7 g/dL (ref 6.5–8.1)

## 2021-08-01 LAB — BRAIN NATRIURETIC PEPTIDE: B Natriuretic Peptide: >4500 pg/mL — ABNORMAL HIGH (ref 0.0–100.0)

## 2021-08-01 LAB — VITAMIN B12: Vitamin B-12: 693 pg/mL (ref 180–914)

## 2021-08-01 LAB — FERRITIN: Ferritin: 8 ng/mL — ABNORMAL LOW (ref 24–336)

## 2021-08-01 LAB — LACTIC ACID, PLASMA: Lactic Acid, Venous: 2 mmol/L (ref 0.5–1.9)

## 2021-08-01 LAB — HEMOGLOBIN
Hemoglobin: 7.8 g/dL — ABNORMAL LOW (ref 13.0–17.0)
Hemoglobin: 7.4 g/dL — ABNORMAL LOW (ref 13.0–17.0)

## 2021-08-01 LAB — URIC ACID: Uric Acid, Serum: 10.9 mg/dL — ABNORMAL HIGH (ref 3.7–8.6)

## 2021-08-01 MED ORDER — LORAZEPAM 2 MG/ML IJ SOLN: 1.0000 mg | INTRAMUSCULAR | Status: AC | PRN

## 2021-08-01 MED ORDER — FOLIC ACID 1 MG PO TABS
1.0000 mg | ORAL_TABLET | Freq: Every day | ORAL | Status: DC
  Administered 2021-08-01 – 2021-08-06 (×6): 1 mg via ORAL
  Filled 2021-08-01 (×6): qty 1

## 2021-08-01 MED ORDER — TORSEMIDE 20 MG PO TABS: 40.0000 mg | ORAL_TABLET | Freq: Two times a day (BID) | ORAL | Status: DC

## 2021-08-01 MED ORDER — SIMETHICONE 80 MG PO CHEW
80.0000 mg | CHEWABLE_TABLET | Freq: Four times a day (QID) | ORAL | Status: DC | PRN
  Filled 2021-08-01: qty 1

## 2021-08-01 MED ORDER — THIAMINE HCL 100 MG/ML IJ SOLN
100.0000 mg | Freq: Every day | INTRAMUSCULAR | Status: DC
  Filled 2021-08-01: qty 2

## 2021-08-01 MED ORDER — ONDANSETRON HCL 4 MG/2ML IJ SOLN: 4.0000 mg | Freq: Four times a day (QID) | INTRAMUSCULAR | Status: DC | PRN

## 2021-08-01 MED ORDER — FUROSEMIDE 10 MG/ML IJ SOLN
40.0000 mg | Freq: Two times a day (BID) | INTRAMUSCULAR | Status: DC
  Administered 2021-08-01 (×2): 40 mg via INTRAVENOUS
  Filled 2021-08-01 (×2): qty 4

## 2021-08-01 MED ORDER — LORAZEPAM 1 MG PO TABS: 1.0000 mg | ORAL_TABLET | ORAL | Status: AC | PRN

## 2021-08-01 MED ORDER — ADULT MULTIVITAMIN W/MINERALS CH
1.0000 | ORAL_TABLET | Freq: Every day | ORAL | Status: DC
  Administered 2021-08-01 – 2021-08-06 (×6): 1 via ORAL
  Filled 2021-08-01 (×6): qty 1

## 2021-08-01 MED ORDER — SODIUM CHLORIDE 0.9 % IV SOLN: INTRAVENOUS | Status: DC

## 2021-08-01 MED ORDER — POLYETHYLENE GLYCOL 3350 17 GM/SCOOP PO POWD
1.0000 | Freq: Once | ORAL | Status: DC
  Filled 2021-08-01: qty 255

## 2021-08-01 MED ORDER — ONDANSETRON HCL 4 MG PO TABS: 4.0000 mg | ORAL_TABLET | Freq: Four times a day (QID) | ORAL | Status: DC | PRN

## 2021-08-01 MED ORDER — THIAMINE HCL 100 MG PO TABS
100.0000 mg | ORAL_TABLET | Freq: Every day | ORAL | Status: DC
  Administered 2021-08-01 – 2021-08-06 (×6): 100 mg via ORAL
  Filled 2021-08-01 (×6): qty 1

## 2021-08-01 MED ORDER — PANTOPRAZOLE SODIUM 40 MG IV SOLR
40.0000 mg | Freq: Two times a day (BID) | INTRAVENOUS | Status: DC
  Administered 2021-08-01 – 2021-08-06 (×12): 40 mg via INTRAVENOUS
  Filled 2021-08-01 (×12): qty 40

## 2021-08-01 MED ORDER — SODIUM CHLORIDE 0.9 % IV SOLN
300.0000 mg | Freq: Once | INTRAVENOUS | Status: AC
  Administered 2021-08-01: 09:00:00 300 mg via INTRAVENOUS
  Filled 2021-08-01: qty 300

## 2021-08-01 NOTE — Consult Note (Addendum)
Cephas Darby, MD [address]  [place]  [place],  [address]  Main: [phone]  Fax: [phone] Pager: [phone]   Consultation  Referring Provider:     No ref. provider found Primary Care Physician:  [facility] Primary Gastroenterologist:  Dr. Alice Reichert         Reason for Consultation:     Severe iron deficiency anemia  Date of Admission:  07/31/2021 Date of Consultation:  08/01/2021         HPI:   John Escobar is a 64 y.o. male history of chronic alcohol use, is admitted with severe symptomatic anemia.  Patient denies any melena, rectal bleeding.  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  Patient was found severe iron deficiency, was discharged home on oral iron and PPI.  Patient has not been taking oral iron because he does not like the taste of it.  He does take Goody powder for headaches.   Patient presented to the [facility] ER yesterday with severe symptomatic anemia.  Hemoglobin dropped to 5 from 8.3 since 11/2020.  Platelets 181, MCV 78.5, iron studies revealed severe iron deficiency.  Patient received 2 units of PRBCs and hemoglobin responded appropriately.  He also received IV iron.  BUN/creatinine are elevated 36/1.92, elevated LFTs AST 130, ALT 123, total bilirubin 1.4, alkaline phosphatase 104.  GI is consulted for further evaluation.  BNP is greater than 4500  When I interviewed the patient, he denies any abdominal pain, nausea or vomiting.  He does report constipation.  He denies melena, rectal bleeding, nausea, hematemesis, coffee-ground emesis.  He does acknowledge to drinking alcohol 2 beers daily  NSAIDs: Goody powder for headaches  Antiplts/Anticoagulants/Anti  thrombotics: None  GI Procedures:  EGD 11/29/2020 - Normal esophagus. - A single bleeding angioectasia in the stomach. Treated with bipolar cautery. 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 Treated with bipolar cautery. - 2 cm hiatal hernia. - Non-bleeding gastric ulcer with no stigmata of bleeding. - Gastritis. - Duodenal erosions without bleeding. - Normal first portion of the duodenum and third portion of the duodenum. - The examination was otherwise normal. - No specimens collected.  Past Medical History:  Diagnosis Date  . ETOH abuse     Past Surgical History:  Procedure Laterality Date  . ELBOW BURSA SURGERY    . ESOPHAGOGASTRODUODENOSCOPY (EGD) WITH PROPOFOL N/A 11/29/2020   Procedure: ESOPHAGOGASTRODUODENOSCOPY (EGD) WITH PROPOFOL;  Surgeon: Toledo, Benay Pike, MD;  Location: [facility] ENDOSCOPY;  Service: Gastroenterology;  Laterality: N/A;  . SHOULDER SURGERY Left     Prior to Admission medications   Medication Sig Start Date End Date Taking? Authorizing Provider  Aspirin-Acetaminophen-Caffeine (GOODY HEADACHE PO) Take 1 Package by mouth daily as needed.   Yes [provider]  bismuth subsalicylate (PEPTO BISMOL) 262 MG/15ML suspension Take 30 mLs by mouth every 6 (six) hours as needed.   Yes [provider]  albuterol (VENTOLIN HFA) 108 (90 Base) MCG/ACT inhaler Inhale 1-2 puffs into the lungs every 4 (four) hours as needed for wheezing or shortness of breath. Patient not taking: Reported on 07/31/2021 12/04/20   Damita Lack, MD  ferrous sulfate 325 (65 FE) MG tablet Take 1 tablet (325 mg total) by mouth 2 (two) times  daily with a meal. Patient not taking: Reported on 07/31/2021 12/04/20   Damita Lack, MD  folic acid (FOLVITE) 1 MG tablet Take 1 tablet (1 mg total) by mouth daily. Patient not taking: Reported on 07/31/2021 12/04/20   Damita Lack, MD  Multiple Vitamin (MULTIVITAMIN WITH MINERALS) TABS tablet Take 1 tablet by mouth  daily. Patient not taking: Reported on 07/31/2021 12/04/20   Damita Lack, MD  pantoprazole (PROTONIX) 40 MG tablet Take 1 tablet (40 mg total) by mouth 2 (two) times daily before a meal. Patient not taking: Reported on 07/31/2021 12/04/20   Damita Lack, MD  senna-docusate (SENOKOT-S) 8.6-50 MG tablet Take 1 tablet by mouth at bedtime as needed for moderate constipation. Patient not taking: Reported on 07/31/2021 12/04/20   Damita Lack, MD  thiamine 100 MG tablet Take 1 tablet (100 mg total) by mouth daily. Patient not taking: Reported on 07/31/2021 12/04/20   Damita Lack, MD   Current Facility-Administered Medications:  .  0.9 %  sodium chloride infusion, , Intravenous, Continuous, Elinora Weigand, Tally Due, MD .  folic acid (FOLVITE) tablet 1 mg, 1 mg, Oral, Daily, Sharen Hones, MD, 1 mg at 08/01/21 1962 .  furosemide (LASIX) injection 40 mg, 40 mg, Intravenous, Q12H, Sharen Hones, MD, 40 mg at 08/01/21 1130 .  LORazepam (ATIVAN) tablet 1-4 mg, 1-4 mg, Oral, Q1H PRN **OR** LORazepam (ATIVAN) injection 1-4 mg, 1-4 mg, Intravenous, Q1H PRN, Sharen Hones, MD .  multivitamin with minerals tablet 1 tablet, 1 tablet, Oral, Daily, Sharen Hones, MD, 1 tablet at 08/01/21 [phone] .  ondansetron (ZOFRAN) tablet 4 mg, 4 mg, Oral, Q6H PRN **OR** ondansetron (ZOFRAN) injection 4 mg, 4 mg, Intravenous, Q6H PRN, Garba, Mohammad L, MD .  pantoprazole (PROTONIX) injection 40 mg, 40 mg, Intravenous, Q12H, Jonelle Sidle, Mohammad L, MD, 40 mg at 08/01/21 9892 .  simethicone (MYLICON) chewable tablet 80 mg, 80 mg, Oral, Q6H PRN, Sharen Hones, MD .  thiamine tablet 100 mg, 100 mg, Oral, Daily, 100 mg at 08/01/21 0838 **OR** thiamine (B-1) injection 100 mg, 100 mg, Intravenous, Daily, Sharen Hones, MD   No family history on file.   Social History   Tobacco Use  . Smoking status: Never  . Smokeless tobacco: Never  Substance Use Topics  . Alcohol use: Yes    Comment: 12 oz beers x 3 / day  . Drug use:  Not Currently    Allergies as of 07/31/2021  . (No Known Allergies)    Review of Systems:    All systems reviewed and negative except where noted in HPI.   Physical Exam:  Vital signs in last 24 hours: Temp:  [97.8 F (36.6 C)-99.3 F (37.4 C)] 99.3 F (37.4 C) (10/20 1237) Pulse Rate:  [98-111] 106 (10/20 1237) Resp:  [16-23] 18 (10/20 1237) BP: (109-126)/(82-99) 117/99 (10/20 1237) SpO2:  [99 %-100 %] 100 % (10/20 1237) Weight:  [63.5 kg-66.7 kg] 66.7 kg (10/20 0151) Last BM Date: 08/01/21 General:   Pleasant, cooperative in NAD Head:  Normocephalic and atraumatic. Eyes:   No icterus.   Conjunctiva pink. PERRLA. Ears:  Normal auditory acuity. Neck:  Supple; no masses or thyroidomegaly Lungs: Respirations even and unlabored. Lungs clear to auscultation bilaterally.   No wheezes, crackles, or rhonchi.  Heart:  Regular rate and rhythm;  Without murmur, clicks, rubs or gallops Abdomen:  Soft, nondistended, nontender. Normal bowel sounds. No appreciable masses or hepatomegaly.  No rebound or guarding.  Rectal:  Not performed.  Msk:  Symmetrical without gross deformities.  Strength generalized weakness Extremities:  Without edema, cyanosis or clubbing. Neurologic:  Alert and oriented x3;  grossly normal neurologically. Skin:  Intact without significant lesions or rashes. Psych:  Alert and cooperative. Normal affect.  LAB RESULTS: CBC Latest Ref Rng & Units 08/01/2021 07/31/2021 12/04/2020  WBC 4.0 - 10.5 K/uL 8.4 9.5 10.0  Hemoglobin 13.0 - 17.0 g/dL 8.0(L) 5.0(L) 8.3(L)  Hematocrit 39.0 - 52.0 % 25.9(L) 19.3(L) 25.7(L)  Platelets 150 - 400 K/uL 151 181 153    BMET BMP Latest Ref Rng & Units 08/01/2021 07/31/2021 12/04/2020  Glucose 70 - 99 mg/dL 101(H) 109(H) 107(H)  BUN 8 - 23 mg/dL 35(H) 36(H) 18  Creatinine 0.61 - 1.24 mg/dL 1.84(H) 1.92(H) 1.32(H)  Sodium 135 - 145 mmol/L 131(L) 133(L) 137  Potassium 3.5 - 5.1 mmol/L 4.4 4.6 3.9  Chloride 98 - 111 mmol/L 101 105 105   CO2 22 - 32 mmol/L 16(L) 17(L) 25  Calcium 8.9 - 10.3 mg/dL 8.7(L) 8.7(L) 8.5(L)    LFT Hepatic Function Latest Ref Rng & Units 08/01/2021 07/31/2021 11/29/2020  Total Protein 6.5 - 8.1 g/dL 6.7 7.5 5.5(L)  Albumin 3.5 - 5.0 g/dL 3.2(L) 3.6 2.6(L)  AST 15 - 41 U/L 117(H) 130(H) 13(L)  ALT 0 - 44 U/L 123(H) 123(H) 8  Alk Phosphatase 38 - 126 U/L 95 104 35(L)  Total Bilirubin 0.3 - 1.2 mg/dL 2.7(H) 1.4(H) 0.3     STUDIES: DG Elbow 2 Views Right  Result Date: 07/31/2021 CLINICAL DATA:  Right elbow pain, large mass posterior elbow EXAM: RIGHT ELBOW - 2 VIEW COMPARISON:  None. FINDINGS: Large soft tissue mass noted posteriorly with areas of internal calcification. This likely reflects enlarged bursa. No acute bony abnormality. Specifically, no fracture, subluxation, or dislocation. IMPRESSION: No acute bony abnormality. Enlarged bursa posterior to the right elbow. Electronically Signed   By: Rolm Baptise M.D.   On: 07/31/2021 20:39   DG Chest Portable 1 View  Result Date: 07/31/2021 CLINICAL DATA:  Shortness of breath EXAM: PORTABLE CHEST 1 VIEW COMPARISON:  11/28/2020 FINDINGS: Heart is upper limits normal in size. No confluent airspace opacities or effusions. No acute bony abnormality. IMPRESSION: No active disease. Electronically Signed   By: Rolm Baptise M.D.   On: 07/31/2021 20:38      Impression / Plan:   HEMAN QUE is a 65 y.o. male with history of chronic alcohol use, known history of gastric AVMs treated with APC, peptic ulcer disease, severe iron deficiency anemia, s/p EGD in 11/2020 presented with severe symptomatic anemia without evidence of active GI bleed  Severe iron deficiency anemia Recommend upper endoscopy as well as colonoscopy for further evaluation when patient is medically stable from cardiac standpoint Patient reports that he had a colonoscopy more than 10 years ago while he was in [place].  Hemoglobin responded appropriately to blood transfusion Okay to  continue Protonix 40 mg p.o. twice daily Agree with parenteral iron therapy and follow-up with hematology as outpatient for IV iron as patient does not like oral iron replacement Resume diet  Elevated troponins, downtrending 2D echo pending BNP is elevated  Elevated LFTs History of chronic alcohol use Check viral hepatitis panel Recommend right upper quadrant ultrasound Monitor LFTs daily   Thank you for involving me in the care of this patient.  GI will sign off at this time Please call GI back when patient is medically stable to undergo endoscopic evaluation    LOS: 1 day  Sherri Sear, MD  08/01/2021, 2:15 PM    Note: This dictation was prepared with Dragon dictation along with smaller phrase technology. Any transcriptional errors that result from this process are unintentional.

## 2021-08-01 NOTE — Progress Notes (Signed)
2nd unit PRBC started at this time.

## 2021-08-01 NOTE — Consult Note (Signed)
Cardiology Consult    Patient ID: AARIK BLANK MRN: [id number], DOB/AGE: 1956-09-24   Admit date: 07/31/2021 Date of Consult: 08/01/2021  Primary Physician: [facility] Primary Cardiologist: Julien Nordmann, MD - new Requesting Provider: Haze Rushing, MD  Patient Profile    John Escobar is a 65 y.o. male with a history of etoh abuse, GIB, PUD, COVID-19 infection (11/2020), and iron def anemia, who is being seen today for the evaluation of CHF and abnl HsTrop at the request of Dr. Chipper Herb.  Past Medical History   Past Medical History:  Diagnosis Date   CKD (chronic kidney disease), stage III (HCC)    COVID-19 virus infection w/ Sepsis 11/2020   ETOH abuse    Gastritis    Hiatal hernia    Iron deficiency anemia    PUD (peptic ulcer disease)     Past Surgical History:  Procedure Laterality Date   ELBOW BURSA SURGERY     ESOPHAGOGASTRODUODENOSCOPY (EGD) WITH PROPOFOL N/A 11/29/2020   Procedure: ESOPHAGOGASTRODUODENOSCOPY (EGD) WITH PROPOFOL;  Surgeon: Toledo, Boykin Nearing, MD;  Location: [facility] ENDOSCOPY;  Service: Gastroenterology;  Laterality: N/A;   SHOULDER SURGERY Left      Allergies  No Known Allergies  History of Present Illness    65 y.o. male with a history of etoh abuse, GIB, PUD, COVID-19 infection (11/2020), and iron def anemia.  No prior cardiac hx.  He was prev admitted in 11/2020 w/ fall/dizziness and was found to have a hgb of 5.6 .  He was also COVID + and septic.  EGD showed multiple ectasia, HH, nonbleeding gastric ulcer, gastritis, and duodenal erosion.  He was treated w/ PPI and PT rec SNF, but pt refused, and he was d/c'd home.    Pt says that he has chronic dyspnea and weakness.  He has not been taking oral iron.  He denies any dark stools/brbpr.  He is relatively sedentary and uses a scooter if he has to go to [employer].  Since August, he has noted progressive worsening of DOE.  He has seen some pedal edema and has noted bloating after  meals, but denies orthopnea or early satiety.  Despite dyspnea, he does not experience chest pain/pressure.  On 10/19, he became very sob @ home and his son called EMS.  He was taken to the ED where he was found to be anemic w/ H/H 5.0/19.3.  BUN/Creat elevated @ 36/1.92.  HsTrop 152  118.  BNP > 4500.  ECG w/ sinus tach, septal infarct, and inflat ST/T abnormalities (new).  CXR w/o acute findings.  He was transfused w/ 2 units and given lasix 40 IV x 1 this AM.  Currently denies dyspnea or chest pain.  Inpatient Medications     folic acid  1 mg Oral Daily   furosemide  40 mg Intravenous Q12H   multivitamin with minerals  1 tablet Oral Daily   pantoprazole (PROTONIX) IV  40 mg Intravenous Q12H   thiamine  100 mg Oral Daily   Or   thiamine  100 mg Intravenous Daily    Family History    No premature CAD. He indicated that his mother is deceased. He indicated that his father is deceased.   Social History    Social History   Socioeconomic History   Marital status: Single    Spouse name: Not on file   Number of children: Not on file   Years of education: Not on file   Highest education level:  Not on file  Occupational History   Not on file  Tobacco Use   Smoking status: Never   Smokeless tobacco: Never  Substance and Sexual Activity   Alcohol use: Yes    Comment: says he prev drank heavily but more moderate since early 1980's.  Currently drinking ~ 6 oz/beer/day.   Drug use: Not Currently   Sexual activity: Not on file  Other Topics Concern   Not on file  Social History Narrative   Lives in [place] w/ his three sons.   Social Determinants of Health   Financial Resource Strain: Not on file  Food Insecurity: Not on file  Transportation Needs: Not on file  Physical Activity: Not on file  Stress: Not on file  Social Connections: Not on file  Intimate Partner Violence: Not on file     Review of Systems    General:  +++ malaise/fatiuge. No chills, fever, night sweats  or weight changes.  Cardiovascular:  No chest pain, +++ dyspnea on exertion, +++ pedal edema, no orthopnea, palpitations, paroxysmal nocturnal dyspnea. Dermatological: No rash, lesions/masses Respiratory: No cough, +++ dyspnea Urologic: No hematuria, dysuria Abdominal:   No nausea, vomiting, diarrhea, bright red blood per rectum, melena, or hematemesis Neurologic:  No visual changes, wkns, changes in mental status. All other systems reviewed and are otherwise negative except as noted above.  Physical Exam    Blood pressure 114/85, pulse 98, temperature 98.7 F (37.1 C), temperature source Oral, resp. rate 16, height 5\' 9"  (1.753 m), weight 66.7 kg, SpO2 100 %.  General: Pleasant, NAD.  Somewhat frail. Psych: Flat affect. Neuro: Alert and oriented X 3. Moves all extremities spontaneously. HEENT: Normal  Neck: Supple without bruits.  Mod elevated JVP. Lungs:  Resp regular and unlabored, diminished breath sounds @ bases. Heart: RRR, tachy, no s3, s4, or murmurs. Abdomen: Soft, non-tender, non-distended, BS + x 4.  Extremities: No clubbing, cyanosis or edema. DP/PT2+, Radials 2+ and equal bilaterally.  R>L elbow swelling.  Labs    Cardiac Enzymes Recent Labs  Lab 07/31/21 1911 08/01/21 0239  TROPONINIHS 152* 118*      Lab Results  Component Value Date   WBC 8.4 08/01/2021   HGB 7.8 (L) 08/01/2021   HCT 25.9 (L) 08/01/2021   MCV 79.4 (L) 08/01/2021   PLT 151 08/01/2021    Recent Labs  Lab 08/01/21 0832  NA 131*  K 4.4  CL 101  CO2 16*  BUN 35*  CREATININE 1.84*  CALCIUM 8.7*  PROT 6.7  BILITOT 2.7*  ALKPHOS 95  ALT 123*  AST 117*  GLUCOSE 101*       Radiology Studies    DG Elbow 2 Views Right  Result Date: 07/31/2021 CLINICAL DATA:  Right elbow pain, large mass posterior elbow EXAM: RIGHT ELBOW - 2 VIEW COMPARISON:  None. FINDINGS: Large soft tissue mass noted posteriorly with areas of internal calcification. This likely reflects enlarged bursa. No acute  bony abnormality. Specifically, no fracture, subluxation, or dislocation. IMPRESSION: No acute bony abnormality. Enlarged bursa posterior to the right elbow. Electronically Signed   By: 08/02/2021 M.D.   On: 07/31/2021 20:39   DG Chest Portable 1 View  Result Date: 07/31/2021 CLINICAL DATA:  Shortness of breath EXAM: PORTABLE CHEST 1 VIEW COMPARISON:  11/28/2020 FINDINGS: Heart is upper limits normal in size. No confluent airspace opacities or effusions. No acute bony abnormality. IMPRESSION: No active disease. Electronically Signed   By: 11/30/2020 M.D.   On: 07/31/2021 20:38  ECG & Cardiac Imaging    Sinus tachycardia, 109, septal infarct, inferolateral ST depression w/ T abnormalities - personally reviewed.  Changes new compared to 11/2020 ECG.  Assessment & Plan    1.  Acute on chronic iron deficiency anemia:  Prev eval in 11/2020 due to dizziness, fall, and profound anemia w/ Hgb of 5.6.  GI w/u notable for erosions, gastritis, PUD, HH, and duodenal erosions.  Since lost to f/u.  Returned 10/19 due to fatigue w/ recurrent anemia and H/H 5.0/19.3.  Ferritin 8, Fe 16.  S/p 2 u PRBCs  H/H 7.8/25.9 today.  Seen by GI w/ plan for EGD/colonoscopy when medically stable. PPI/Iron.  Awaiting echo.  He has JVD but does not appear to be massively volume overloaded.  Provided that EF nl, he would be low risk for cardiac complication in setting of EGD/colonoscopy.  2.  Acute congestive heart failure:  ? Syst vs diastolic.  BNP >4500 on presentation.  JVD present and he says that he's noted pedal edema, but currently not edematous.  Will give another dose of IV lasix now (received 40mg  IV lasix this AM).  Minus 970 ml today.  Await echo.  3.  Demand ischemia/Abnl ECG:  in setting of above, new inferolateral ST/T changes w/ abnl R progression.  HsTrops mildly elevated @ 152  118.  No h/o chest pain but has chronic DOE.  If echo shows nl EF, would plan risk stratification w/ lexican myoview, ideally as  outpt, once anemia improved.  Avoiding asa/heparin in setting of anemia.  BP's trending low - will hold off on ? blocker.  No statin w/ abnl LFTs.  4.  ETOH abuse:  Notes prior heavy usage, but says that he's only drinking about 6 oz of beer daily, now.  Cessation advised.  5.  CKD III:  Unclear baseline.  Discharged in Feb @ creat of 1.32.  Now 1.92  1.84 in setting of anemia.  Hemodynamically stable.  Follow w/ diuresis.  6.  Abnl LFTs:  h/o etoh.  ? Role of passive congestion/CHF.  Follow w/ diuresis.  Signed, Mar, NP 08/01/2021, 5:15 PM  For questions or updates, please contact   Please consult [email or web address] for contact info under Cardiology/STEMI.

## 2021-08-01 NOTE — Progress Notes (Signed)
PROGRESS NOTE    John Escobar  VPX:[id number] DOB: Dec 25, 1955 DOA: 07/31/2021 PCP: [facility]   Chief complaint.  Shortness of breath. Brief Narrative:  John Escobar is a 65 y.o. male with medical history significant of alcohol abuse, history of GI bleed with multiple ulcers back in February, COVID-19 infection in February, chronic anemia who presented to the ER with progressive weakness over the last couple of weeks.  Patient has also noted worsening lower extremity edema and some exertional dyspnea. Upon arriving the hospital, hemoglobin was 5.0.  Received 2 units PRBC.   Assessment & Plan:   Principal Problem:   Symptomatic anemia Active Problems:   Generalized weakness   Lactic acidosis   Chronic alcohol abuse   Hyponatremia   AKI (acute kidney injury) (HCC)   Troponin I above reference range   Symptomatic iron deficient anemia. Possible upper GI bleed secondary to AVM. Reviewed the patient chart, patient had EGD performed in 11/2020, showed 3 AVMs, with one bleeding. Consult GI. Patient has received 2 units of PRBC.  We will also give IV iron.  Follow-up hemoglobin, and transfuse as needed. Patient may had a bleeding recently, but no current bleeding.  Acute on chronic congestive heart failure, presumed to be systolic. Elevated troponin secondary to congestive heart failure. Patient has significant shortness of breath exertion, orthopnea.  Also significant edema.  BNP more than 4500. Patient has received 2 units of PRBC, will give IV Lasix.   Chronic kidney disease stage IIIa. Hyponatremia. Patient current receiving IV Lasix, will follow renal function closely.  Renal function slightly worsened today than baseline.  Alcohol abuse. Alcoholic hepatitis. Advised to quit alcohol, watch for withdrawal, continue CIWA protocol. Pending ultrasound to rule out liver cirrhosis.  Lactic acidosis. No evidence of infection.  Condition  improving.   DVT prophylaxis: SCDs Code Status: Full Family Communication:  Disposition Plan:    Status is: Inpatient  Remains inpatient appropriate because: For medical problems including acute on chronic congestive heart failure.  He is requiring IV Lasix, he also requiring decision about EGD        I/O last 3 completed shifts: In: 1406.2 [P.O.:118; I.V.:1.6; Blood:1286.7] Out: -  Total I/O In: 480 [P.O.:480] Out: -      Consultants:  GI  Procedures: None  Antimicrobials: None  Subjective: Patient still has significant leg edema, complaining of short of breath with minimal exertion.  Cough, nonproductive. He does not have any black stools, no nausea vomiting or abdominal pain. No dysuria hematuria. No fever or chills  Objective: Vitals:   08/01/21 0211 08/01/21 0226 08/01/21 0554 08/01/21 0807  BP: 116/85 119/90 109/87 114/88  Pulse: (!) 101 (!) 101  99  Resp: 18 17 17 18   Temp: 97.8 F (36.6 C) 98 F (36.7 C) 98.1 F (36.7 C) 98.6 F (37 C)  TempSrc: Oral Oral Oral Oral  SpO2: 100% 100% 100% 100%  Weight:      Height:        Intake/Output Summary (Last 24 hours) at 08/01/2021 1039 Last data filed at 08/01/2021 1018 Gross per 24 hour  Intake 1886.24 ml  Output --  Net 1886.24 ml   Filed Weights   07/31/21 1903 08/01/21 0151  Weight: 63.5 kg 66.7 kg    Examination:  General exam: Appears calm and comfortable  Respiratory system: Decreased breathing sounds. Respiratory effort normal. Cardiovascular system: S1 & S2 heard, RRR. No JVD, murmurs, rubs, gallops or clicks.  Gastrointestinal system: Abdomen  is nondistended, soft and nontender. No organomegaly or masses felt. Normal bowel sounds heard. Central nervous system: Alert and oriented. No focal neurological deficits. Extremities: 2+ leg edema Skin: No rashes, lesions or ulcers Psychiatry:  Mood & affect appropriate.     Data Reviewed: I have personally reviewed following labs and  imaging studies  CBC: Recent Labs  Lab 07/31/21 1911 08/01/21 0832  WBC 9.5 8.4  NEUTROABS 7.7  --   HGB 5.0* 8.0*  HCT 19.3* 25.9*  MCV 78.5* 79.4*  PLT 181 151   Basic Metabolic Panel: Recent Labs  Lab 07/31/21 1911 08/01/21 0832  NA 133* 131*  K 4.6 4.4  CL 105 101  CO2 17* 16*  GLUCOSE 109* 101*  BUN 36* 35*  CREATININE 1.92* 1.84*  CALCIUM 8.7* 8.7*   GFR: Estimated Creatinine Clearance: 37.8 mL/min (A) (by C-G formula based on SCr of 1.84 mg/dL (H)). Liver Function Tests: Recent Labs  Lab 07/31/21 1911 08/01/21 0832  AST 130* 117*  ALT 123* 123*  ALKPHOS 104 95  BILITOT 1.4* 2.7*  PROT 7.5 6.7  ALBUMIN 3.6 3.2*   Recent Labs  Lab 07/31/21 1911  LIPASE 43   No results for input(s): AMMONIA in the last 168 hours. Coagulation Profile: No results for input(s): INR, PROTIME in the last 168 hours. Cardiac Enzymes: No results for input(s): CKTOTAL, CKMB, CKMBINDEX, TROPONINI in the last 168 hours. BNP (last 3 results) No results for input(s): PROBNP in the last 8760 hours. HbA1C: No results for input(s): HGBA1C in the last 72 hours. CBG: No results for input(s): GLUCAP in the last 168 hours. Lipid Profile: No results for input(s): CHOL, HDL, LDLCALC, TRIG, CHOLHDL, LDLDIRECT in the last 72 hours. Thyroid Function Tests: No results for input(s): TSH, T4TOTAL, FREET4, T3FREE, THYROIDAB in the last 72 hours. Anemia Panel: Recent Labs    08/01/21 0239  FERRITIN 8*  TIBC 585*  IRON 34*   Sepsis Labs: Recent Labs  Lab 07/31/21 1911 08/01/21 0832  LATICACIDVEN 2.7* 2.0*    Recent Results (from the past 240 hour(s))  Resp Panel by RT-PCR (Flu A&B, Covid) Nasopharyngeal Swab     Status: None   Collection Time: 07/31/21 10:28 PM   Specimen: Nasopharyngeal Swab; Nasopharyngeal(NP) swabs in vial transport medium  Result Value Ref Range Status   SARS Coronavirus 2 by RT PCR NEGATIVE NEGATIVE Final    Comment: (NOTE) SARS-CoV-2 target nucleic  acids are NOT DETECTED.  The SARS-CoV-2 RNA is generally detectable in upper respiratory specimens during the acute phase of infection. The lowest concentration of SARS-CoV-2 viral copies this assay can detect is 138 copies/mL. A negative result does not preclude SARS-Cov-2 infection and should not be used as the sole basis for treatment or other patient management decisions. A negative result may occur with  improper specimen collection/handling, submission of specimen other than nasopharyngeal swab, presence of viral mutation(s) within the areas targeted by this assay, and inadequate number of viral copies(<138 copies/mL). A negative result must be combined with clinical observations, patient history, and epidemiological information. The expected result is Negative.  Fact Sheet for Patients:  BloggerCourse.com  Fact Sheet for Healthcare Providers:  SeriousBroker.it  This test is no t yet approved or cleared by the [place] FDA and  has been authorized for detection and/or diagnosis of SARS-CoV-2 by FDA under an Emergency Use Authorization (EUA). This EUA will remain  in effect (meaning this test can be used) for the duration of the COVID-19 declaration under Section  564(b)(1) of the Act, 21 U.S.C.section 360bbb-3(b)(1), unless the authorization is terminated  or revoked sooner.       Influenza A by PCR NEGATIVE NEGATIVE Final   Influenza B by PCR NEGATIVE NEGATIVE Final    Comment: (NOTE) The Xpert Xpress SARS-CoV-2/FLU/RSV plus assay is intended as an aid in the diagnosis of influenza from Nasopharyngeal swab specimens and should not be used as a sole basis for treatment. Nasal washings and aspirates are unacceptable for Xpert Xpress SARS-CoV-2/FLU/RSV testing.  Fact Sheet for Patients: BloggerCourse.com  Fact Sheet for Healthcare Providers: SeriousBroker.it  This  test is not yet approved or cleared by the [place] FDA and has been authorized for detection and/or diagnosis of SARS-CoV-2 by FDA under an Emergency Use Authorization (EUA). This EUA will remain in effect (meaning this test can be used) for the duration of the COVID-19 declaration under Section 564(b)(1) of the Act, 21 U.S.C. section 360bbb-3(b)(1), unless the authorization is terminated or revoked.  Performed at [facility], [address] [place], [place] [address]          Radiology Studies: DG Elbow 2 Views Right  Result Date: 07/31/2021 CLINICAL DATA:  Right elbow pain, large mass posterior elbow EXAM: RIGHT ELBOW - 2 VIEW COMPARISON:  None. FINDINGS: Large soft tissue mass noted posteriorly with areas of internal calcification. This likely reflects enlarged bursa. No acute bony abnormality. Specifically, no fracture, subluxation, or dislocation. IMPRESSION: No acute bony abnormality. Enlarged bursa posterior to the right elbow. Electronically Signed   By: Charlett Nose M.D.   On: 07/31/2021 20:39   DG Chest Portable 1 View  Result Date: 07/31/2021 CLINICAL DATA:  Shortness of breath EXAM: PORTABLE CHEST 1 VIEW COMPARISON:  11/28/2020 FINDINGS: Heart is upper limits normal in size. No confluent airspace opacities or effusions. No acute bony abnormality. IMPRESSION: No active disease. Electronically Signed   By: Charlett Nose M.D.   On: 07/31/2021 20:38        Scheduled Meds:  folic acid  1 mg Oral Daily   multivitamin with minerals  1 tablet Oral Daily   pantoprazole (PROTONIX) IV  40 mg Intravenous Q12H   thiamine  100 mg Oral Daily   Or   thiamine  100 mg Intravenous Daily   torsemide  40 mg Oral BID   Continuous Infusions:   LOS: 1 day    Time spent: 36 minutes    Marrion Coy, MD [facility]   To contact the attending provider between 7A-7P or the covering provider during after hours 7P-7A, please log into the web site  [email or web address] and access using universal [place] password for that web site. If you do not have the password, please call the hospital operator.  08/01/2021, 10:39 AM

## 2021-08-01 NOTE — Progress Notes (Signed)
*  PRELIMINARY RESULTS* Echocardiogram 2D Echocardiogram has been performed.  Cristela Blue 08/01/2021, 2:14 PM

## 2021-08-02 ENCOUNTER — Inpatient Hospital Stay: Payer: Medicare HMO

## 2021-08-02 ENCOUNTER — Encounter
Admission: EM | Disposition: A | Discharge status: Home / Self Care | Payer: Self-pay | Source: Home / Self Care | Attending: Internal Medicine

## 2021-08-02 DIAGNOSIS — N179 Acute kidney failure, unspecified: Secondary | ICD-10-CM | POA: Diagnosis not present

## 2021-08-02 DIAGNOSIS — I5043 Acute on chronic combined systolic (congestive) and diastolic (congestive) heart failure: Secondary | ICD-10-CM

## 2021-08-02 DIAGNOSIS — I5021 Acute systolic (congestive) heart failure: Secondary | ICD-10-CM

## 2021-08-02 LAB — FOLATE RBC
Folate, Hemolysate: 370 ng/mL
Folate, RBC: 1440 ng/mL (ref >498)
Hematocrit: 25.7 % — ABNORMAL LOW (ref 37.5–51.0)

## 2021-08-02 LAB — HEMOGLOBIN: Hemoglobin: 7.9 g/dL — ABNORMAL LOW (ref 13.0–17.0)

## 2021-08-02 LAB — PHOSPHORUS: Phosphorus: 3.8 mg/dL (ref 2.5–4.6)

## 2021-08-02 LAB — CBC WITH DIFFERENTIAL/PLATELET
Abs Immature Granulocytes: 0.11 10*3/uL — ABNORMAL HIGH (ref 0.00–0.07)
Basophils Absolute: 0 10*3/uL (ref 0.0–0.1)
Basophils Relative: 0 %
Eosinophils Absolute: 0.2 10*3/uL (ref 0.0–0.5)
Eosinophils Relative: 2 %
HCT: 25.4 % — ABNORMAL LOW (ref 39.0–52.0)
Hemoglobin: 7.4 g/dL — ABNORMAL LOW (ref 13.0–17.0)
Immature Granulocytes: 1 %
Lymphocytes Relative: 9 %
Lymphs Abs: 0.8 10*3/uL (ref 0.7–4.0)
MCH: 22.8 pg — ABNORMAL LOW (ref 26.0–34.0)
MCHC: 29.1 g/dL — ABNORMAL LOW (ref 30.0–36.0)
MCV: 78.4 fL — ABNORMAL LOW (ref 80.0–100.0)
Monocytes Absolute: 1 10*3/uL (ref 0.1–1.0)
Monocytes Relative: 12 %
Neutro Abs: 6.2 10*3/uL (ref 1.7–7.7)
Neutrophils Relative %: 76 %
Platelets: 150 10*3/uL (ref 150–400)
RBC: 3.24 MIL/uL — ABNORMAL LOW (ref 4.22–5.81)
RDW: 19.7 % — ABNORMAL HIGH (ref 11.5–15.5)
WBC: 8.2 10*3/uL (ref 4.0–10.5)
nRBC: 1.3 % — ABNORMAL HIGH (ref 0.0–0.2)

## 2021-08-02 LAB — PREPARE RBC (CROSSMATCH)

## 2021-08-02 LAB — BASIC METABOLIC PANEL
Anion gap: 8 (ref 5–15)
BUN: 36 mg/dL — ABNORMAL HIGH (ref 8–23)
CO2: 21 mmol/L — ABNORMAL LOW (ref 22–32)
Calcium: 8.2 mg/dL — ABNORMAL LOW (ref 8.9–10.3)
Chloride: 104 mmol/L (ref 98–111)
Creatinine, Ser: 2.07 mg/dL — ABNORMAL HIGH (ref 0.61–1.24)
GFR, Estimated: 35 mL/min — ABNORMAL LOW (ref >60)
Glucose, Bld: 125 mg/dL — ABNORMAL HIGH (ref 70–99)
Potassium: 3.7 mmol/L (ref 3.5–5.1)
Sodium: 133 mmol/L — ABNORMAL LOW (ref 135–145)

## 2021-08-02 LAB — MAGNESIUM: Magnesium: 1.8 mg/dL (ref 1.7–2.4)

## 2021-08-02 SURGERY — COLONOSCOPY WITH PROPOFOL
Anesthesia: General

## 2021-08-02 MED ORDER — FUROSEMIDE 10 MG/ML IJ SOLN
40.0000 mg | Freq: Once | INTRAMUSCULAR | Status: AC
  Administered 2021-08-02: 20:00:00 40 mg via INTRAVENOUS
  Filled 2021-08-02: qty 4

## 2021-08-02 MED ORDER — SODIUM CHLORIDE 0.9% IV SOLUTION: Freq: Once | INTRAVENOUS | Status: AC

## 2021-08-02 MED ORDER — METOPROLOL SUCCINATE ER 25 MG PO TB24
12.5000 mg | ORAL_TABLET | Freq: Every day | ORAL | Status: DC
  Administered 2021-08-02 – 2021-08-06 (×4): 12.5 mg via ORAL
  Filled 2021-08-02 (×5): qty 1

## 2021-08-02 NOTE — Progress Notes (Signed)
Progress Note  Patient Name: John Escobar Date of Encounter: 08/02/2021  Primary Cardiologist: Julien Nordmann, MD  Subjective   Denies chest pain or sob.  Bothered by R hip and L shoulder discomfort, as well as numbness in his R foot.  Inpatient Medications    Scheduled Meds:  sodium chloride   Intravenous Once   folic acid  1 mg Oral Daily   furosemide  40 mg Intravenous Once   metoprolol succinate  12.5 mg Oral Daily   multivitamin with minerals  1 tablet Oral Daily   pantoprazole (PROTONIX) IV  40 mg Intravenous Q12H   thiamine  100 mg Oral Daily   Or   thiamine  100 mg Intravenous Daily   Continuous Infusions:  sodium chloride     PRN Meds: LORazepam **OR** LORazepam, ondansetron **OR** ondansetron (ZOFRAN) IV, simethicone   Vital Signs    Vitals:   08/01/21 2012 08/02/21 0001 08/02/21 0412 08/02/21 1019  BP: 104/77 101/77 108/79 97/71  Pulse: 96 92 96 97  Resp: 16 16 20 18   Temp: 98.3 F (36.8 C) 98.3 F (36.8 C) 98.3 F (36.8 C) 98.5 F (36.9 C)  TempSrc:   Oral   SpO2: 100% 99% 100% 100%  Weight:      Height:        Intake/Output Summary (Last 24 hours) at 08/02/2021 1350 Last data filed at 08/02/2021 0657 Gross per 24 hour  Intake 118 ml  Output 3725 ml  Net -3607 ml   Filed Weights   07/31/21 1903 08/01/21 0151  Weight: 63.5 kg 66.7 kg    Physical Exam   GEN: Frail, in no acute distress.  HEENT: Grossly normal.  Neck: Supple, no JVD, carotid bruits, or masses. Cardiac: RRR, no murmurs, rubs, or gallops. No clubbing, cyanosis, edema.  Radials 2+, DP/PT 2+ and equal bilaterally.  Respiratory:  Respirations regular and unlabored, diminished breath sounds at the bases. GI: Soft, nontender, nondistended, BS + x 4. MS: Right elbow swelling/bursitis.  Multiple bony abnormalities noted-hands, elbows, knees, feet. Skin: warm and dry, no rash. Neuro:  Strength and sensation are intact. Psych: AAOx3.  Normal affect.  Labs     Chemistry Recent Labs  Lab 07/31/21 1911 08/01/21 0832 08/02/21 0210  NA 133* 131* 133*  K 4.6 4.4 3.7  CL 105 101 104  CO2 17* 16* 21*  GLUCOSE 109* 101* 125*  BUN 36* 35* 36*  CREATININE 1.92* 1.84* 2.07*  CALCIUM 8.7* 8.7* 8.2*  PROT 7.5 6.7  --   ALBUMIN 3.6 3.2*  --   AST 130* 117*  --   ALT 123* 123*  --   ALKPHOS 104 95  --   BILITOT 1.4* 2.7*  --   GFRNONAA 38* 40* 35*  ANIONGAP 11 14 8      Hematology Recent Labs  Lab 07/31/21 1911 08/01/21 0832 08/01/21 1424 08/01/21 1955 08/02/21 0210 08/02/21 0732  WBC 9.5 8.4  --   --  8.2  --   RBC 2.46* 3.26*  --   --  3.24*  --   HGB 5.0* 8.0*   < > 7.4* 7.4* 7.9*  HCT 19.3* 25.9*  --   --  25.4*  --   MCV 78.5* 79.4*  --   --  78.4*  --   MCH 20.3* 24.5*  --   --  22.8*  --   MCHC 25.9* 30.9  --   --  29.1*  --   RDW 21.3* 19.7*  --   --  19.7*  --   PLT 181 151  --   --  150  --    < > = values in this interval not displayed.    Cardiac Enzymes  Recent Labs  Lab 07/31/21 1911 08/01/21 0239  TROPONINIHS 152* 118*      BNP Recent Labs  Lab 08/01/21 0832  BNP >4,500.0*     DDimer No results for input(s): DDIMER in the last 168 hours.   Lipids  No results found for: CHOL, HDL, LDLCALC, LDLDIRECT, TRIG, CHOLHDL  HbA1c  No results found for: HGBA1C  Radiology    DG Elbow 2 Views Right  Result Date: 07/31/2021 CLINICAL DATA:  Right elbow pain, large mass posterior elbow EXAM: RIGHT ELBOW - 2 VIEW COMPARISON:  None. FINDINGS: Large soft tissue mass noted posteriorly with areas of internal calcification. This likely reflects enlarged bursa. No acute bony abnormality. Specifically, no fracture, subluxation, or dislocation. IMPRESSION: No acute bony abnormality. Enlarged bursa posterior to the right elbow. Electronically Signed   By: Charlett Nose M.D.   On: 07/31/2021 20:39   US Abdomen Complete  Result Date: 08/02/2021 CLINICAL DATA:  Elevated LFTs EXAM: ABDOMEN ULTRASOUND COMPLETE COMPARISON:   None. FINDINGS: Gallbladder: No gallstones or wall thickening visualized. No sonographic Murphy sign noted by sonographer. Common bile duct: Diameter: 3 mm Liver: No focal lesion identified. Increased liver echogenicity. Portal vein is patent on color Doppler imaging with normal direction of blood flow towards the liver. IVC: No abnormality visualized. Pancreas: Visualized portion unremarkable. Spleen: Spleen volume is large at 14.1 x 10.3 x 8.1 cm calculated volume of 618 cc. Right Kidney: Length: 8.3. Echogenicity within normal limits. No mass or hydronephrosis visualized. Left Kidney: Length: 8.8. Echogenicity within normal limits. No mass or hydronephrosis visualized. Abdominal aorta: No aneurysm visualized. Other findings: Small bilateral pleural effusions IMPRESSION: 1. No acute findings in the abdomen. Normal gallbladder and common bile duct. 2. Increased liver echogenicity commonly represents hepatic steatosis. 3. Mild splenomegaly. 4. Small bilateral pleural effusions. Electronically Signed   By: Genevive Bi M.D.   On: 08/02/2021 10:19   DG Chest Portable 1 View  Result Date: 07/31/2021 CLINICAL DATA:  Shortness of breath EXAM: PORTABLE CHEST 1 VIEW COMPARISON:  11/28/2020 FINDINGS: Heart is upper limits normal in size. No confluent airspace opacities or effusions. No acute bony abnormality. IMPRESSION: No active disease. Electronically Signed   By: Charlett Nose M.D.   On: 07/31/2021 20:38   Telemetry    RSR. Brief run of atrial tachycardia - Personally Reviewed  Cardiac Studies   2D Echocardiogram 10.20.2022   1. Left ventricular ejection fraction, by estimation, is 20 to 25%. The  left ventricle has severely decreased function. The left ventricle has no  regional wall motion abnormalities. The left ventricular internal cavity  size was moderately dilated. Left  ventricular diastolic parameters are consistent with Grade II diastolic  dysfunction (pseudonormalization).   2. Right  ventricular systolic function is low normal. The right  ventricular size is mildly enlarged. There is normal pulmonary artery  systolic pressure.   3. Left atrial size was mildly dilated.   4. The mitral valve is normal in structure. Moderate mitral valve  regurgitation.   5. Tricuspid valve regurgitation is moderate.   Patient Profile      65 y.o. male with a history of etoh abuse, GIB, PUD, COVID-19 infection (11/2020), and iron def anemia, who was admitted 10/19 w/ fatigue, severe anemia (hgb 5.0), and new CHF.  EF 20-25%  by echo.  Assessment & Plan    1.  Acute on chronic iron deficiency anemia: Prev eval in 11/2020 due to dizziness, fall, and profound anemia w/ Hgb of 5.6.  GI w/u notable for erosions, gastritis, PUD, HH, and duodenal erosions.  Since lost to f/u.  Returned 10/19 due to fatigue w/ recurrent anemia and H/H 5.0/19.3.  Ferritin 8, Fe 16.  S/p 2 u PRBCs  7.4/25.4.  Plan for additional blood today.  Seen by GI with plan for EGD/colonoscopy when medically stable.  Remains on PPI/IV iron.  2.  Acute systolic congestive heart failure: BNP greater than 4500 on presentation.  EF by echo yesterday, 10/20, 20-25% with grade 2 diastolic dysfunction, normal RV function, and moderate TR/MR.  Patient received 2 doses of intravenous Lasix yesterday with rising BUN and creatinine to 36 and 2.07 (from 35/1.84).  Plan is for additional Lasix following transfusion later today.  He does not appear to be markedly volume overloaded on exam.  Discussed with patient that in the setting of newly found cardiomyopathy, that diagnostic catheterization would be appropriate once anemia and renal function stable.  At this time he expressed disinterest in pursuing catheterization and we mutually agreed to consider discussing this going forward.  We have added low-dose beta-blocker therapy and will need to watch blood pressures closely.  No acei/arb/arni/spiro/sglt2i in the setting of worsening renal function and  soft blood pressures.  3.  Demand ischemia/abnormal ECG: In the setting of above, new inferolateral ST/T changes with abnormal R wave progression.  Troponins were mildly elevated at 152  118.  No history of chest pain but does have chronic dyspnea exertion.  With LV dysfunction, ideally pursue diagnostic catheterization.  Currently complicated by anemia and worsening renal function.  Patient also not particularly interested in pursuing invasive eval.  We will continue to discuss with patient going forward.  Adding low-dose beta-blocker.  Follow-up lipids.  No aspirin or heparin in the setting of anemia.  4.  EtOH abuse:  Usage but says he is only been drinking about 6 ounces of beer daily now.  Cessation advised.  5.  Stage III chronic kidney disease: Unclear baseline.  Discharge in February with a creatinine of 1.32.  Creatinine 2.07 this morning.  Follow.  6.  Abnormal LFTs: History of EtOH.  Question role of passive congestion/CHF.  Follow.  Signed, Nicolasa Ducking, NP  08/02/2021, 1:50 PM    For questions or updates, please contact   Please consult [email or web address] for contact info under Cardiology/STEMI.

## 2021-08-02 NOTE — TOC Initial Note (Signed)
Transition of Care [facility]) - Initial/Assessment Note    Patient Details  Name: John Escobar MRN: [id number] Date of Birth: Feb 29, 1956  [facility] of [facility]) CM/SW Contact:    Shelbie Hutching, RN Phone Number: 08/02/2021, 2:03 PM  Clinical Narrative:                 Patient admitted to the hospital with symptomatic anemia.  Patient was supposed to have colonoscopy today but it was postponed.  RNCM met with patient at the bedside.  Consult for substance abuse.  Patient admits to alcohol use but only 1/2 beer a day, he reports that he does not have an issue with alcohol. Patient lives at home with his 3 children, [place], Lenny Pastel, and [place].  Missy and Lenny Pastel work at [employer], [place] does not work or have a working phone.  No one in the home can drive or has transportation.  Patient reports that he used to use a cab but that they don't want to take him to appointments.  He knows about [facility] but reports that he can't get them to transport him either.  He used to go to [facility] and then went to [facility] but that was the worst thing he ever did.  Unsure of last doctors appointment.  Transportation will cont to be an issue at discharge.  RNCM has reached out and given a referral to [facility] which is a primary care provider that will go to the patient.  They provide primary care services and are available 24/7 365 days per year.   RNCM did discuss making referral to [facility] with patient.   Dennis Bast SW with [facility] [phone].   Expected Discharge Plan: Home/Self Care Barriers to Discharge: Continued Medical Work up   Patient Goals and CMS Choice        Expected Discharge Plan and Services Expected Discharge Plan: Home/Self Care   Discharge Planning Services: CM Consult   Living arrangements for the past 2 months: Apartment                 DME Arranged: N/A DME Agency: NA       HH Arranged: NA HH Agency: NA        Prior Living  Arrangements/Services Living arrangements for the past 2 months: Apartment Lives with:: Adult Children Patient language and need for interpreter reviewed:: Yes Do you feel safe going back to the place where you live?: Yes      Need for Family Participation in Patient Care: Yes (Comment) Care giver support system in place?: Yes (comment) (sons)   Criminal Activity/Legal Involvement Pertinent to Current Situation/Hospitalization: No - Comment as needed  Activities of Daily Living Home Assistive Devices/Equipment: Environmental consultant (specify type) ADL Screening (condition at time of admission) Patient's cognitive ability adequate to safely complete daily activities?: Yes Is the patient deaf or have difficulty hearing?: Yes Does the patient have difficulty seeing, even when wearing glasses/contacts?: Yes Does the patient have difficulty concentrating, remembering, or making decisions?: No Patient able to express need for assistance with ADLs?: Yes Does the patient have difficulty dressing or bathing?: No Independently performs ADLs?: Yes (appropriate for developmental age) Does the patient have difficulty walking or climbing stairs?: Yes Weakness of Legs: Both Weakness of Arms/Hands: Both  Permission Sought/Granted Permission sought to share information with : Case Manager, Family Supports Permission granted to share information with : Yes, Verbal Permission Granted  Share Information with NAME: MIssy Jerline Pain  Permission granted to share info w Relationship: daughter  Permission granted to share info w Contact Information: [phone]  Emotional Assessment Appearance:: Appears older than stated age Attitude/Demeanor/Rapport: Engaged, Complaining Affect (typically observed): Blunt, Defensive, Irritable Orientation: : Oriented to Self, Oriented to Place, Oriented to  Time, Oriented to Situation Alcohol / Substance Use: Not Applicable Psych Involvement: No (comment)  Admission diagnosis:   Generalized weakness [R53.1] Elevated lactic acid level [R79.89] Symptomatic anemia [D64.9] Patient Active Problem List   Diagnosis Date Noted   Acute on chronic combined systolic and diastolic CHF (congestive heart failure) ([place]) 08/02/2021   Symptomatic anemia 07/31/2021   Hyponatremia 07/31/2021   AKI (acute kidney injury) ([place]) 07/31/2021   Troponin I above reference range 07/31/2021   Acute anemia 11/28/2020   Generalized weakness 11/28/2020   Elevated serum creatinine 11/28/2020   COVID-19 virus infection 11/28/2020   Lactic acidosis 11/28/2020   Severe sepsis (HCC) 11/28/2020   Chronic alcohol abuse 11/28/2020   PCP:  [facility] [place]:  No Pharmacies Listed    Social Determinants of Health (SDOH) Interventions    Readmission Risk Interventions No flowsheet data found.

## 2021-08-02 NOTE — Progress Notes (Signed)
PROGRESS NOTE    John Escobar  CZY:[id number] DOB: 1956-01-29 DOA: 07/31/2021 PCP: [facility]   Chief complaint.  Shortness of breath. Brief Narrative:  John Escobar is a 65 y.o. male with medical history significant of alcohol abuse, history of GI bleed with multiple ulcers back in February, COVID-19 infection in February, chronic anemia who presented to the ER with progressive weakness over the last couple of weeks.  Patient has also noted worsening lower extremity edema and some exertional dyspnea. Upon arriving the hospital, hemoglobin was 5.0.  Received 2 units PRBC. Also has acute on chronic systolic congestive heart failure, received a day of IV Lasix.  Echocardiogram showed ejection fraction 20 to 25%.  Assessment & Plan:   Principal Problem:   Symptomatic anemia Active Problems:   Generalized weakness   Lactic acidosis   Chronic alcohol abuse   Hyponatremia   AKI (acute kidney injury) (HCC)   Troponin I above reference range     Symptomatic iron deficient anemia. Possible upper GI bleed secondary to AVM. Patient does not have any GI bleed at this time.  Received IV iron and 2 units PRBC. Per cardiology, the goal of hemoglobin is 10 due to congestive heart failure. I will give another unit today, gradually increase hemoglobin as patient also received IV iron.  B12 level normal.  Acute on chronic combined systolic and diastolic congestive heart failure. 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 Elevated troponin secondary to congestive heart failure. Reviewed echocardiogram, ejection fraction 20 to 25%, diastolic dysfunction, moderate mitral regurgitation. Due to worsening renal function, I will hold IV Lasix for now, but will give Lasix after RBC is given.   Acute on chronic kidney disease stage IIIa. Hyponatremia. Renal function is worse after giving diuretics, will hold Lasix for now but give Lasix after blood  transfusion.  Recheck BMP tomorrow.  Alcohol abuse. Alcoholic hepatitis. Continue to monitor   DVT prophylaxis: SCDs Code Status: Full Family Communication:  Disposition Plan:      Status is: Inpatient   Remains inpatient appropriate because: For medical problems including acute on chronic congestive heart failure.  He is requiring IV Lasix, he also requiring decision about EGD       I/O last 3 completed shifts: In: 2004.2 [P.O.:716; I.V.:1.6; Blood:1286.7] Out: 4150 [Urine:4150] No intake/output data recorded.   Consultants:  GI, cardiology   Procedures: None   Antimicrobials: None    Subjective: Was unhappy about the food this morning. He has some orthopnea, did not have paroxysmal nocturnal dyspnea last night.  Leg edema essentially resolved. No abdominal pain nausea vomiting.  No bowel movement for the last 24 hours. No chest pain palpitation. No dysuria or hematuria.  Objective: Vitals:   08/01/21 2012 08/02/21 0001 08/02/21 0412 08/02/21 1019  BP: 104/77 101/77 108/79 97/71  Pulse: 96 92 96 97  Resp: 16 16 20 18   Temp: 98.3 F (36.8 C) 98.3 F (36.8 C) 98.3 F (36.8 C) 98.5 F (36.9 C)  TempSrc:   Oral   SpO2: 100% 99% 100% 100%  Weight:      Height:        Intake/Output Summary (Last 24 hours) at 08/02/2021 1049 Last data filed at 08/02/2021 0657 Gross per 24 hour  Intake 118 ml  Output 4150 ml  Net -4032 ml   Filed Weights   07/31/21 1903 08/01/21 0151  Weight: 63.5 kg 66.7 kg    Examination:  General exam: Appears calm and comfortable  Respiratory system: Clear to auscultation. Respiratory effort normal. Cardiovascular system: S1 & S2 heard, RRR. No JVD, murmurs, rubs, gallops or clicks. No pedal edema. Gastrointestinal system: Abdomen is nondistended, soft and nontender. No organomegaly or masses felt. Normal bowel sounds heard. Central nervous system: Alert and oriented. No focal neurological deficits. Extremities: Symmetric 5 x  5 power. Skin: No rashes, lesions or ulcers Psychiatry: Judgement and insight appear normal. Mood & affect appropriate.     Data Reviewed: I have personally reviewed following labs and imaging studies  CBC: Recent Labs  Lab 07/31/21 1911 08/01/21 0832 08/01/21 1424 08/01/21 1955 08/02/21 0210 08/02/21 0732  WBC 9.5 8.4  --   --  8.2  --   NEUTROABS 7.7  --   --   --  6.2  --   HGB 5.0* 8.0* 7.8* 7.4* 7.4* 7.9*  HCT 19.3* 25.9*  --   --  25.4*  --   MCV 78.5* 79.4*  --   --  78.4*  --   PLT 181 151  --   --  150  --    Basic Metabolic Panel: Recent Labs  Lab 07/31/21 1911 08/01/21 0832 08/02/21 0210  NA 133* 131* 133*  K 4.6 4.4 3.7  CL 105 101 104  CO2 17* 16* 21*  GLUCOSE 109* 101* 125*  BUN 36* 35* 36*  CREATININE 1.92* 1.84* 2.07*  CALCIUM 8.7* 8.7* 8.2*  MG  --   --  1.8  PHOS  --   --  3.8   GFR: Estimated Creatinine Clearance: 33.6 mL/min (A) (by C-G formula based on SCr of 2.07 mg/dL (H)). Liver Function Tests: Recent Labs  Lab 07/31/21 1911 08/01/21 0832  AST 130* 117*  ALT 123* 123*  ALKPHOS 104 95  BILITOT 1.4* 2.7*  PROT 7.5 6.7  ALBUMIN 3.6 3.2*   Recent Labs  Lab 07/31/21 1911  LIPASE 43   No results for input(s): AMMONIA in the last 168 hours. Coagulation Profile: No results for input(s): INR, PROTIME in the last 168 hours. Cardiac Enzymes: No results for input(s): CKTOTAL, CKMB, CKMBINDEX, TROPONINI in the last 168 hours. BNP (last 3 results) No results for input(s): PROBNP in the last 8760 hours. HbA1C: No results for input(s): HGBA1C in the last 72 hours. CBG: No results for input(s): GLUCAP in the last 168 hours. Lipid Profile: No results for input(s): CHOL, HDL, LDLCALC, TRIG, CHOLHDL, LDLDIRECT in the last 72 hours. Thyroid Function Tests: No results for input(s): TSH, T4TOTAL, FREET4, T3FREE, THYROIDAB in the last 72 hours. Anemia Panel: Recent Labs    08/01/21 0239  VITAMINB12 693  FERRITIN 8*  TIBC 585*  IRON 34*    Sepsis Labs: Recent Labs  Lab 07/31/21 1911 08/01/21 0832  LATICACIDVEN 2.7* 2.0*    Recent Results (from the past 240 hour(s))  Resp Panel by RT-PCR (Flu A&B, Covid) Nasopharyngeal Swab     Status: None   Collection Time: 07/31/21 10:28 PM   Specimen: Nasopharyngeal Swab; Nasopharyngeal(NP) swabs in vial transport medium  Result Value Ref Range Status   SARS Coronavirus 2 by RT PCR NEGATIVE NEGATIVE Final    Comment: (NOTE) SARS-CoV-2 target nucleic acids are NOT DETECTED.  The SARS-CoV-2 RNA is generally detectable in upper respiratory specimens during the acute phase of infection. The lowest concentration of SARS-CoV-2 viral copies this assay can detect is 138 copies/mL. A negative result does not preclude SARS-Cov-2 infection and should not be used as the sole basis for treatment or other patient  management decisions. A negative result may occur with  improper specimen collection/handling, submission of specimen other than nasopharyngeal swab, presence of viral mutation(s) within the areas targeted by this assay, and inadequate number of viral copies(<138 copies/mL). A negative result must be combined with clinical observations, patient history, and epidemiological information. The expected result is Negative.  Fact Sheet for Patients:  BloggerCourse.com  Fact Sheet for Healthcare Providers:  SeriousBroker.it  This test is no t yet approved or cleared by the [place] FDA and  has been authorized for detection and/or diagnosis of SARS-CoV-2 by FDA under an Emergency Use Authorization (EUA). This EUA will remain  in effect (meaning this test can be used) for the duration of the COVID-19 declaration under Section 564(b)(1) of the Act, 21 U.S.C.section 360bbb-3(b)(1), unless the authorization is terminated  or revoked sooner.       Influenza A by PCR NEGATIVE NEGATIVE Final   Influenza B by PCR NEGATIVE NEGATIVE  Final    Comment: (NOTE) The Xpert Xpress SARS-CoV-2/FLU/RSV plus assay is intended as an aid in the diagnosis of influenza from Nasopharyngeal swab specimens and should not be used as a sole basis for treatment. Nasal washings and aspirates are unacceptable for Xpert Xpress SARS-CoV-2/FLU/RSV testing.  Fact Sheet for Patients: BloggerCourse.com  Fact Sheet for Healthcare Providers: SeriousBroker.it  This test is not yet approved or cleared by the [place] FDA and has been authorized for detection and/or diagnosis of SARS-CoV-2 by FDA under an Emergency Use Authorization (EUA). This EUA will remain in effect (meaning this test can be used) for the duration of the COVID-19 declaration under Section 564(b)(1) of the Act, 21 U.S.C. section 360bbb-3(b)(1), unless the authorization is terminated or revoked.  Performed at [facility], [address], [place], [place] [address]          Radiology Studies: DG Elbow 2 Views Right  Result Date: 07/31/2021 CLINICAL DATA:  Right elbow pain, large mass posterior elbow EXAM: RIGHT ELBOW - 2 VIEW COMPARISON:  None. FINDINGS: Large soft tissue mass noted posteriorly with areas of internal calcification. This likely reflects enlarged bursa. No acute bony abnormality. Specifically, no fracture, subluxation, or dislocation. IMPRESSION: No acute bony abnormality. Enlarged bursa posterior to the right elbow. Electronically Signed   By: Charlett Nose M.D.   On: 07/31/2021 20:39   US Abdomen Complete  Result Date: 08/02/2021 CLINICAL DATA:  Elevated LFTs EXAM: ABDOMEN ULTRASOUND COMPLETE COMPARISON:  None. FINDINGS: Gallbladder: No gallstones or wall thickening visualized. No sonographic Murphy sign noted by sonographer. Common bile duct: Diameter: 3 mm Liver: No focal lesion identified. Increased liver echogenicity. Portal vein is patent on color Doppler imaging with normal direction  of blood flow towards the liver. IVC: No abnormality visualized. Pancreas: Visualized portion unremarkable. Spleen: Spleen volume is large at 14.1 x 10.3 x 8.1 cm calculated volume of 618 cc. Right Kidney: Length: 8.3. Echogenicity within normal limits. No mass or hydronephrosis visualized. Left Kidney: Length: 8.8. Echogenicity within normal limits. No mass or hydronephrosis visualized. Abdominal aorta: No aneurysm visualized. Other findings: Small bilateral pleural effusions IMPRESSION: 1. No acute findings in the abdomen. Normal gallbladder and common bile duct. 2. Increased liver echogenicity commonly represents hepatic steatosis. 3. Mild splenomegaly. 4. Small bilateral pleural effusions. Electronically Signed   By: Genevive Bi M.D.   On: 08/02/2021 10:19   DG Chest Portable 1 View  Result Date: 07/31/2021 CLINICAL DATA:  Shortness of breath EXAM: PORTABLE CHEST 1 VIEW COMPARISON:  11/28/2020 FINDINGS: Heart  is upper limits normal in size. No confluent airspace opacities or effusions. No acute bony abnormality. IMPRESSION: No active disease. Electronically Signed   By: Charlett Nose M.D.   On: 07/31/2021 20:38   ECHOCARDIOGRAM COMPLETE  Result Date: 08/01/2021    ECHOCARDIOGRAM REPORT   Patient Name:   John Escobar Date of Exam: 08/01/2021 Medical Rec #:  [id number]       Height:       69.0 in Accession #:    [id number]      Weight:       147.0 lb Date of Birth:  02-07-1956       BSA:          1.813 m Patient Age:    65 years        BP:           117/99 mmHg Patient Gender: M               HR:           106 bpm. Exam Location:  [facility] Procedure: 2D Echo, Cardiac Doppler and Color Doppler Indications:     CHF-acute systolic I50.21  History:         Patient has no prior history of Echocardiogram examinations.                  ETOH abuse.  Sonographer:     Cristela Blue Referring Phys:  [id number] Marrion Coy Diagnosing Phys: Julien Nordmann MD IMPRESSIONS  1. Left ventricular ejection fraction, by  estimation, is 20 to 25%. The left ventricle has severely decreased function. The left ventricle has no regional wall motion abnormalities. The left ventricular internal cavity size was moderately dilated. Left ventricular diastolic parameters are consistent with Grade II diastolic dysfunction (pseudonormalization).  2. Right ventricular systolic function is low normal. The right ventricular size is mildly enlarged. There is normal pulmonary artery systolic pressure.  3. Left atrial size was mildly dilated.  4. The mitral valve is normal in structure. Moderate mitral valve regurgitation.  5. Tricuspid valve regurgitation is moderate. FINDINGS  Left Ventricle: Left ventricular ejection fraction, by estimation, is 20 to 25%. The left ventricle has severely decreased function. The left ventricle has no regional wall motion abnormalities. The left ventricular internal cavity size was moderately dilated. There is no left ventricular hypertrophy. Left ventricular diastolic parameters are consistent with Grade II diastolic dysfunction (pseudonormalization). Right Ventricle: The right ventricular size is mildly enlarged. No increase in right ventricular wall thickness. Right ventricular systolic function is low normal. There is normal pulmonary artery systolic pressure. The tricuspid regurgitant velocity is 1.79 m/s, and with an assumed right atrial pressure of 5 mmHg, the estimated right ventricular systolic pressure is 17.8 mmHg. Left Atrium: Left atrial size was mildly dilated. Right Atrium: Right atrial size was normal in size. Pericardium: There is no evidence of pericardial effusion. Mitral Valve: The mitral valve is normal in structure. Moderate mitral valve regurgitation. No evidence of mitral valve stenosis. Tricuspid Valve: The tricuspid valve is normal in structure. Tricuspid valve regurgitation is moderate . No evidence of tricuspid stenosis. Aortic Valve: The aortic valve is normal in structure. Aortic valve  regurgitation is not visualized. Mild to moderate aortic valve sclerosis/calcification is present, without any evidence of aortic stenosis. Aortic valve mean gradient measures 2.0 mmHg. Aortic valve peak gradient measures 3.1 mmHg. Aortic valve area, by VTI measures 1.75 cm. Pulmonic Valve: The pulmonic valve was normal in structure. Pulmonic valve regurgitation is  not visualized. No evidence of pulmonic stenosis. Aorta: The aortic root is normal in size and structure. Venous: The inferior vena cava is normal in size with greater than 50% respiratory variability, suggesting right atrial pressure of 3 mmHg. IAS/Shunts: No atrial level shunt detected by color flow Doppler.  LEFT VENTRICLE PLAX 2D LVIDd:         5.60 cm      Diastology LVIDs:         5.10 cm      LV e' medial:    3.37 cm/s LV PW:         1.10 cm      LV E/e' medial:  21.6 LV IVS:        0.85 cm      LV e' lateral:   11.50 cm/s LVOT diam:     2.00 cm      LV E/e' lateral: 6.3 LV SV:         20 LV SV Index:   11 LVOT Area:     3.14 cm  LV Volumes (MOD) LV vol d, MOD A2C: 128.0 ml LV vol d, MOD A4C: 144.0 ml LV vol s, MOD A2C: 98.3 ml LV vol s, MOD A4C: 123.0 ml LV SV MOD A2C:     29.7 ml LV SV MOD A4C:     144.0 ml LV SV MOD BP:      24.3 ml RIGHT VENTRICLE RV Basal diam:  4.10 cm RV S prime:     6.85 cm/s TAPSE (M-mode): 3.1 cm LEFT ATRIUM             Index        RIGHT ATRIUM           Index LA diam:        4.20 cm 2.32 cm/m   RA Area:     15.00 cm LA Vol (A2C):   51.5 ml 28.41 ml/m  RA Volume:   38.40 ml  21.18 ml/m LA Vol (A4C):   38.7 ml 21.35 ml/m LA Biplane Vol: 46.3 ml 25.54 ml/m  AORTIC VALVE                    PULMONIC VALVE AV Area (Vmax):    1.60 cm     PV Vmax:        0.42 m/s AV Area (Vmean):   1.54 cm     PV Peak grad:   0.7 mmHg AV Area (VTI):     1.75 cm     RVOT Peak grad: 1 mmHg AV Vmax:           88.47 cm/s AV Vmean:          60.233 cm/s AV VTI:            0.113 m AV Peak Grad:      3.1 mmHg AV Mean Grad:      2.0 mmHg LVOT  Vmax:         45.00 cm/s LVOT Vmean:        29.600 cm/s LVOT VTI:          0.063 m LVOT/AV VTI ratio: 0.56  AORTA Ao Root diam: 2.60 cm MITRAL VALVE               TRICUSPID VALVE MV Area (PHT): 6.07 cm    TR Peak grad:   12.8 mmHg MV Decel Time: 125 msec    TR Vmax:  179.00 cm/s MV E velocity: 72.80 cm/s MV A velocity: 36.40 cm/s  SHUNTS MV E/A ratio:  2.00        Systemic VTI:  0.06 m                            Systemic Diam: 2.00 cm Julien Nordmann MD Electronically signed by Julien Nordmann MD Signature Date/Time: 08/01/2021/6:06:46 PM    Final (Updated)         Scheduled Meds:  sodium chloride   Intravenous Once   folic acid  1 mg Oral Daily   furosemide  40 mg Intravenous Once   multivitamin with minerals  1 tablet Oral Daily   pantoprazole (PROTONIX) IV  40 mg Intravenous Q12H   thiamine  100 mg Oral Daily   Or   thiamine  100 mg Intravenous Daily   Continuous Infusions:  sodium chloride       LOS: 2 days    Time spent: 32 minutes    Marrion Coy, MD [facility]   To contact the attending provider between 7A-7P or the covering provider during after hours 7P-7A, please log into the web site [email or web address] and access using universal [place] password for that web site. If you do not have the password, please call the hospital operator.  08/02/2021, 10:49 AM

## 2021-08-02 NOTE — Care Management Important Message (Signed)
Important Message  Patient Details  Name: John Escobar MRN: [id number] Date of Birth: 07-25-1956   Medicare Important Message Given:  Yes     Olegario Messier A Alicya Bena 08/02/2021, 3:18 PM

## 2021-08-03 DIAGNOSIS — I5043 Acute on chronic combined systolic (congestive) and diastolic (congestive) heart failure: Secondary | ICD-10-CM

## 2021-08-03 DIAGNOSIS — D649 Anemia, unspecified: Secondary | ICD-10-CM | POA: Diagnosis not present

## 2021-08-03 LAB — CBC WITH DIFFERENTIAL/PLATELET
Abs Immature Granulocytes: 0.12 10*3/uL — ABNORMAL HIGH (ref 0.00–0.07)
Basophils Absolute: 0 10*3/uL (ref 0.0–0.1)
Basophils Relative: 0 %
Eosinophils Absolute: 0.2 10*3/uL (ref 0.0–0.5)
Eosinophils Relative: 2 %
HCT: 30.5 % — ABNORMAL LOW (ref 39.0–52.0)
Hemoglobin: 9.2 g/dL — ABNORMAL LOW (ref 13.0–17.0)
Immature Granulocytes: 1 %
Lymphocytes Relative: 8 %
Lymphs Abs: 0.7 10*3/uL (ref 0.7–4.0)
MCH: 23.5 pg — ABNORMAL LOW (ref 26.0–34.0)
MCHC: 30.2 g/dL (ref 30.0–36.0)
MCV: 77.8 fL — ABNORMAL LOW (ref 80.0–100.0)
Monocytes Absolute: 1 10*3/uL (ref 0.1–1.0)
Monocytes Relative: 11 %
Neutro Abs: 7.1 10*3/uL (ref 1.7–7.7)
Neutrophils Relative %: 78 %
Platelets: 164 10*3/uL (ref 150–400)
RBC: 3.92 MIL/uL — ABNORMAL LOW (ref 4.22–5.81)
RDW: 21.6 % — ABNORMAL HIGH (ref 11.5–15.5)
Smear Review: NORMAL
WBC: 9.1 10*3/uL (ref 4.0–10.5)
nRBC: 0.4 % — ABNORMAL HIGH (ref 0.0–0.2)

## 2021-08-03 LAB — TYPE AND SCREEN
ABO/RH(D): A POS
Antibody Screen: NEGATIVE
Unit division: 0
Unit division: 0
Unit division: 0

## 2021-08-03 LAB — BPAM RBC
Blood Product Expiration Date: 202210262359
Blood Product Expiration Date: 202211122359
Blood Product Expiration Date: 202211162359
ISSUE DATE / TIME: 202210192141
ISSUE DATE / TIME: 202210200159
ISSUE DATE / TIME: 202210211601
Unit Type and Rh: 600
Unit Type and Rh: 6200
Unit Type and Rh: 6200

## 2021-08-03 LAB — BASIC METABOLIC PANEL
Anion gap: 8 (ref 5–15)
BUN: 32 mg/dL — ABNORMAL HIGH (ref 8–23)
CO2: 24 mmol/L (ref 22–32)
Calcium: 8.3 mg/dL — ABNORMAL LOW (ref 8.9–10.3)
Chloride: 102 mmol/L (ref 98–111)
Creatinine, Ser: 1.8 mg/dL — ABNORMAL HIGH (ref 0.61–1.24)
GFR, Estimated: 41 mL/min — ABNORMAL LOW (ref >60)
Glucose, Bld: 99 mg/dL (ref 70–99)
Potassium: 3.4 mmol/L — ABNORMAL LOW (ref 3.5–5.1)
Sodium: 134 mmol/L — ABNORMAL LOW (ref 135–145)

## 2021-08-03 LAB — MAGNESIUM: Magnesium: 1.8 mg/dL (ref 1.7–2.4)

## 2021-08-03 MED ORDER — FUROSEMIDE 10 MG/ML IJ SOLN
40.0000 mg | Freq: Every day | INTRAMUSCULAR | Status: DC
  Administered 2021-08-03 – 2021-08-05 (×3): 40 mg via INTRAVENOUS
  Filled 2021-08-03 (×3): qty 4

## 2021-08-03 MED ORDER — POTASSIUM CHLORIDE CRYS ER 20 MEQ PO TBCR
40.0000 meq | EXTENDED_RELEASE_TABLET | Freq: Once | ORAL | Status: AC
  Administered 2021-08-03: 40 meq via ORAL
  Filled 2021-08-03: qty 2

## 2021-08-03 MED ORDER — OXYCODONE-ACETAMINOPHEN 5-325 MG PO TABS
1.0000 | ORAL_TABLET | ORAL | Status: DC | PRN
Start: 2021-08-03 — End: 2021-08-06
  Administered 2021-08-03 – 2021-08-06 (×7): 1 via ORAL
  Filled 2021-08-03 (×7): qty 1

## 2021-08-03 MED ORDER — POTASSIUM CHLORIDE 20 MEQ PO PACK
40.0000 meq | PACK | Freq: Once | ORAL | Status: AC
  Administered 2021-08-03: 40 meq via ORAL
  Filled 2021-08-03: qty 2

## 2021-08-03 MED ORDER — SPIRONOLACTONE 25 MG PO TABS
12.5000 mg | ORAL_TABLET | Freq: Every day | ORAL | Status: DC
  Administered 2021-08-03 – 2021-08-05 (×3): 12.5 mg via ORAL
  Filled 2021-08-03 (×3): qty 0.5
  Filled 2021-08-03: qty 1
  Filled 2021-08-03: qty 0.5
  Filled 2021-08-03 (×2): qty 1

## 2021-08-03 NOTE — Progress Notes (Signed)
PROGRESS NOTE    John Escobar  GDJ:[id number] DOB: Apr 27, 1956 DOA: 07/31/2021 PCP: [facility]    Brief Narrative:  John Escobar is a 65 y.o. male with medical history significant of alcohol abuse, history of GI bleed with multiple ulcers back in February, COVID-19 infection in February, chronic anemia who presented to the ER with progressive weakness over the last couple of weeks.  Patient has also noted worsening lower extremity edema and some exertional dyspnea. Upon arriving the hospital, hemoglobin was 5.0.  Received 2 units PRBC. Also has acute on chronic systolic congestive heart failure, received a day of IV Lasix.  Echocardiogram showed ejection fraction 20 to 25%.   Assessment & Plan:   Principal Problem:   Symptomatic anemia Active Problems:   Generalized weakness   Lactic acidosis   Chronic alcohol abuse   Hyponatremia   AKI (acute kidney injury) (HCC)   Troponin I above reference range   Acute on chronic combined systolic and diastolic CHF (congestive heart failure) (HCC)   Acute HFrEF (heart failure with reduced ejection fraction) (HCC)   Symptomatic iron deficient anemia. Possible upper GI bleed secondary to AVM. Hemoglobin has improved to 9.2 after 3 units of PRBC and 1 dose of IV iron. Has been seen by GI, potential work-up on Monday.  Acute on chronic combined systolic and diastolic congestive heart failure. 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 Elevated troponin secondary to congestive heart failure. Followed by cardiology, started on lower dose Aldactone and a beta-blocker.  Acute on chronic kidney disease stage IIIa. Hyponatremia. Hypokalemia. Renal function is better today, restarted lower dose torsemide.  Also repleted potassium.  Patient refused to take his oral liquid potassium, will give a dose of potassium tablet.  Alcohol abuse. Alcoholic hepatitis.  DVT prophylaxis: SCDs Code Status:  Full Family Communication:  Disposition Plan:      Status is: Inpatient   Remains inpatient appropriate because: For medical problems including acute on chronic congestive heart failure.  He is requiring IV Lasix, he also requiring decision about EGD        I/O last 3 completed shifts: In: 966.8 [P.O.:236; I.V.:37.8; Blood:692.9] Out: 5150 [Urine:5150] Total I/O In: 120 [P.O.:120] Out: -    Consultants:  GI, cardiology   Procedures: None   Antimicrobials: None     Subjective: Patient states that he could not sleep at nighttime, still has insomnia.  No leg edema. No abdominal pain or nausea vomiting. Had a large bowel movement this morning. Denies any fever chills  No dysuria hematuria  No chest pain  Objective: Vitals:   08/02/21 2326 08/03/21 0549 08/03/21 0724 08/03/21 1100  BP: 98/73 99/74 99/76  92/68  Pulse:  86 86 88  Resp: 18 16 16 17   Temp: 98.2 F (36.8 C) 98.3 F (36.8 C) 98.3 F (36.8 C) 99.7 F (37.6 C)  TempSrc: Oral Oral Oral Oral  SpO2: 100% 98% 100%   Weight:      Height:        Intake/Output Summary (Last 24 hours) at 08/03/2021 1142 Last data filed at 08/03/2021 1026 Gross per 24 hour  Intake 968.75 ml  Output 2450 ml  Net -1481.25 ml   Filed Weights   07/31/21 1903 08/01/21 0151  Weight: 63.5 kg 66.7 kg    Examination:  General exam: Appears calm and comfortable  Respiratory system: Clear to auscultation. Respiratory effort normal. Cardiovascular system: S1 & S2 heard, RRR. No JVD, murmurs, rubs, gallops or clicks.  No pedal edema. Gastrointestinal system: Abdomen is nondistended, soft and nontender. No organomegaly or masses felt. Normal bowel sounds heard. Central nervous system: Alert and oriented. No focal neurological deficits. Extremities: Symmetric 5 x 5 power. Skin: No rashes, lesions or ulcers Psychiatry: Judgement and insight appear normal. Mood & affect appropriate.     Data Reviewed: I have personally  reviewed following labs and imaging studies  CBC: Recent Labs  Lab 07/31/21 1911 08/01/21 0239 08/01/21 0832 08/01/21 1424 08/01/21 1955 08/02/21 0210 08/02/21 0732 08/03/21 0418  WBC 9.5  --  8.4  --   --  8.2  --  9.1  NEUTROABS 7.7  --   --   --   --  6.2  --  7.1  HGB 5.0*  --  8.0* 7.8* 7.4* 7.4* 7.9* 9.2*  HCT 19.3* 25.7* 25.9*  --   --  25.4*  --  30.5*  MCV 78.5*  --  79.4*  --   --  78.4*  --  77.8*  PLT 181  --  151  --   --  150  --  164   Basic Metabolic Panel: Recent Labs  Lab 07/31/21 1911 08/01/21 0832 08/02/21 0210 08/03/21 0418  NA 133* 131* 133* 134*  K 4.6 4.4 3.7 3.4*  CL 105 101 104 102  CO2 17* 16* 21* 24  GLUCOSE 109* 101* 125* 99  BUN 36* 35* 36* 32*  CREATININE 1.92* 1.84* 2.07* 1.80*  CALCIUM 8.7* 8.7* 8.2* 8.3*  MG  --   --  1.8 1.8  PHOS  --   --  3.8  --    GFR: Estimated Creatinine Clearance: 38.6 mL/min (A) (by C-G formula based on SCr of 1.8 mg/dL (H)). Liver Function Tests: Recent Labs  Lab 07/31/21 1911 08/01/21 0832  AST 130* 117*  ALT 123* 123*  ALKPHOS 104 95  BILITOT 1.4* 2.7*  PROT 7.5 6.7  ALBUMIN 3.6 3.2*   Recent Labs  Lab 07/31/21 1911  LIPASE 43   No results for input(s): AMMONIA in the last 168 hours. Coagulation Profile: No results for input(s): INR, PROTIME in the last 168 hours. Cardiac Enzymes: No results for input(s): CKTOTAL, CKMB, CKMBINDEX, TROPONINI in the last 168 hours. BNP (last 3 results) No results for input(s): PROBNP in the last 8760 hours. HbA1C: No results for input(s): HGBA1C in the last 72 hours. CBG: No results for input(s): GLUCAP in the last 168 hours. Lipid Profile: No results for input(s): CHOL, HDL, LDLCALC, TRIG, CHOLHDL, LDLDIRECT in the last 72 hours. Thyroid Function Tests: No results for input(s): TSH, T4TOTAL, FREET4, T3FREE, THYROIDAB in the last 72 hours. Anemia Panel: Recent Labs    08/01/21 0239  VITAMINB12 693  FERRITIN 8*  TIBC 585*  IRON 34*   Sepsis  Labs: Recent Labs  Lab 07/31/21 1911 08/01/21 0832  LATICACIDVEN 2.7* 2.0*    Recent Results (from the past 240 hour(s))  Resp Panel by RT-PCR (Flu A&B, Covid) Nasopharyngeal Swab     Status: None   Collection Time: 07/31/21 10:28 PM   Specimen: Nasopharyngeal Swab; Nasopharyngeal(NP) swabs in vial transport medium  Result Value Ref Range Status   SARS Coronavirus 2 by RT PCR NEGATIVE NEGATIVE Final    Comment: (NOTE) SARS-CoV-2 target nucleic acids are NOT DETECTED.  The SARS-CoV-2 RNA is generally detectable in upper respiratory specimens during the acute phase of infection. The lowest concentration of SARS-CoV-2 viral copies this assay can detect is 138 copies/mL. A negative result does  not preclude SARS-Cov-2 infection and should not be used as the sole basis for treatment or other patient management decisions. A negative result may occur with  improper specimen collection/handling, submission of specimen other than nasopharyngeal swab, presence of viral mutation(s) within the areas targeted by this assay, and inadequate number of viral copies(<138 copies/mL). A negative result must be combined with clinical observations, patient history, and epidemiological information. The expected result is Negative.  Fact Sheet for Patients:  BloggerCourse.com  Fact Sheet for Healthcare Providers:  SeriousBroker.it  This test is no t yet approved or cleared by the [place] FDA and  has been authorized for detection and/or diagnosis of SARS-CoV-2 by FDA under an Emergency Use Authorization (EUA). This EUA will remain  in effect (meaning this test can be used) for the duration of the COVID-19 declaration under Section 564(b)(1) of the Act, 21 U.S.C.section 360bbb-3(b)(1), unless the authorization is terminated  or revoked sooner.       Influenza A by PCR NEGATIVE NEGATIVE Final   Influenza B by PCR NEGATIVE NEGATIVE Final     Comment: (NOTE) The Xpert Xpress SARS-CoV-2/FLU/RSV plus assay is intended as an aid in the diagnosis of influenza from Nasopharyngeal swab specimens and should not be used as a sole basis for treatment. Nasal washings and aspirates are unacceptable for Xpert Xpress SARS-CoV-2/FLU/RSV testing.  Fact Sheet for Patients: BloggerCourse.com  Fact Sheet for Healthcare Providers: SeriousBroker.it  This test is not yet approved or cleared by the [place] FDA and has been authorized for detection and/or diagnosis of SARS-CoV-2 by FDA under an Emergency Use Authorization (EUA). This EUA will remain in effect (meaning this test can be used) for the duration of the COVID-19 declaration under Section 564(b)(1) of the Act, 21 U.S.C. section 360bbb-3(b)(1), unless the authorization is terminated or revoked.  Performed at [facility], [address], [place], [place] [address]          Radiology Studies: US Abdomen Complete  Result Date: 08/02/2021 CLINICAL DATA:  Elevated LFTs EXAM: ABDOMEN ULTRASOUND COMPLETE COMPARISON:  None. FINDINGS: Gallbladder: No gallstones or wall thickening visualized. No sonographic Murphy sign noted by sonographer. Common bile duct: Diameter: 3 mm Liver: No focal lesion identified. Increased liver echogenicity. Portal vein is patent on color Doppler imaging with normal direction of blood flow towards the liver. IVC: No abnormality visualized. Pancreas: Visualized portion unremarkable. Spleen: Spleen volume is large at 14.1 x 10.3 x 8.1 cm calculated volume of 618 cc. Right Kidney: Length: 8.3. Echogenicity within normal limits. No mass or hydronephrosis visualized. Left Kidney: Length: 8.8. Echogenicity within normal limits. No mass or hydronephrosis visualized. Abdominal aorta: No aneurysm visualized. Other findings: Small bilateral pleural effusions IMPRESSION: 1. No acute findings in the abdomen.  Normal gallbladder and common bile duct. 2. Increased liver echogenicity commonly represents hepatic steatosis. 3. Mild splenomegaly. 4. Small bilateral pleural effusions. Electronically Signed   By: Genevive Bi M.D.   On: 08/02/2021 10:19   ECHOCARDIOGRAM COMPLETE  Result Date: 08/01/2021    ECHOCARDIOGRAM REPORT   Patient Name:   John Escobar Date of Exam: 08/01/2021 Medical Rec #:  [id number]       Height:       69.0 in Accession #:    [id number]      Weight:       147.0 lb Date of Birth:  [birth year]       BSA:          1.813 m Patient Age:  65 years        BP:           117/99 mmHg Patient Gender: M               HR:           106 bpm. Exam Location:  [facility] Procedure: 2D Echo, Cardiac Doppler and Color Doppler Indications:     CHF-acute systolic I50.21  History:         Patient has no prior history of Echocardiogram examinations.                  ETOH abuse.  Sonographer:     Cristela Blue Referring Phys:  [id number] Marrion Coy Diagnosing Phys: Julien Nordmann MD IMPRESSIONS  1. Left ventricular ejection fraction, by estimation, is 20 to 25%. The left ventricle has severely decreased function. The left ventricle has no regional wall motion abnormalities. The left ventricular internal cavity size was moderately dilated. Left ventricular diastolic parameters are consistent with Grade II diastolic dysfunction (pseudonormalization).  2. Right ventricular systolic function is low normal. The right ventricular size is mildly enlarged. There is normal pulmonary artery systolic pressure.  3. Left atrial size was mildly dilated.  4. The mitral valve is normal in structure. Moderate mitral valve regurgitation.  5. Tricuspid valve regurgitation is moderate. FINDINGS  Left Ventricle: Left ventricular ejection fraction, by estimation, is 20 to 25%. The left ventricle has severely decreased function. The left ventricle has no regional wall motion abnormalities. The left ventricular internal cavity size was moderately  dilated. There is no left ventricular hypertrophy. Left ventricular diastolic parameters are consistent with Grade II diastolic dysfunction (pseudonormalization). Right Ventricle: The right ventricular size is mildly enlarged. No increase in right ventricular wall thickness. Right ventricular systolic function is low normal. There is normal pulmonary artery systolic pressure. The tricuspid regurgitant velocity is 1.79 m/s, and with an assumed right atrial pressure of 5 mmHg, the estimated right ventricular systolic pressure is 17.8 mmHg. Left Atrium: Left atrial size was mildly dilated. Right Atrium: Right atrial size was normal in size. Pericardium: There is no evidence of pericardial effusion. Mitral Valve: The mitral valve is normal in structure. Moderate mitral valve regurgitation. No evidence of mitral valve stenosis. Tricuspid Valve: The tricuspid valve is normal in structure. Tricuspid valve regurgitation is moderate . No evidence of tricuspid stenosis. Aortic Valve: The aortic valve is normal in structure. Aortic valve regurgitation is not visualized. Mild to moderate aortic valve sclerosis/calcification is present, without any evidence of aortic stenosis. Aortic valve mean gradient measures 2.0 mmHg. Aortic valve peak gradient measures 3.1 mmHg. Aortic valve area, by VTI measures 1.75 cm. Pulmonic Valve: The pulmonic valve was normal in structure. Pulmonic valve regurgitation is not visualized. No evidence of pulmonic stenosis. Aorta: The aortic root is normal in size and structure. Venous: The inferior vena cava is normal in size with greater than 50% respiratory variability, suggesting right atrial pressure of 3 mmHg. IAS/Shunts: No atrial level shunt detected by color flow Doppler.  LEFT VENTRICLE PLAX 2D LVIDd:         5.60 cm      Diastology LVIDs:         5.10 cm      LV e' medial:    3.37 cm/s LV PW:         1.10 cm      LV E/e' medial:  21.6 LV IVS:        0.85 cm  LV e' lateral:   11.50 cm/s  LVOT diam:     2.00 cm      LV E/e' lateral: 6.3 LV SV:         20 LV SV Index:   11 LVOT Area:     3.14 cm  LV Volumes (MOD) LV vol d, MOD A2C: 128.0 ml LV vol d, MOD A4C: 144.0 ml LV vol s, MOD A2C: 98.3 ml LV vol s, MOD A4C: 123.0 ml LV SV MOD A2C:     29.7 ml LV SV MOD A4C:     144.0 ml LV SV MOD BP:      24.3 ml RIGHT VENTRICLE RV Basal diam:  4.10 cm RV S prime:     6.85 cm/s TAPSE (M-mode): 3.1 cm LEFT ATRIUM             Index        RIGHT ATRIUM           Index LA diam:        4.20 cm 2.32 cm/m   RA Area:     15.00 cm LA Vol (A2C):   51.5 ml 28.41 ml/m  RA Volume:   38.40 ml  21.18 ml/m LA Vol (A4C):   38.7 ml 21.35 ml/m LA Biplane Vol: 46.3 ml 25.54 ml/m  AORTIC VALVE                    PULMONIC VALVE AV Area (Vmax):    1.60 cm     PV Vmax:        0.42 m/s AV Area (Vmean):   1.54 cm     PV Peak grad:   0.7 mmHg AV Area (VTI):     1.75 cm     RVOT Peak grad: 1 mmHg AV Vmax:           88.47 cm/s AV Vmean:          60.233 cm/s AV VTI:            0.113 m AV Peak Grad:      3.1 mmHg AV Mean Grad:      2.0 mmHg LVOT Vmax:         45.00 cm/s LVOT Vmean:        29.600 cm/s LVOT VTI:          0.063 m LVOT/AV VTI ratio: 0.56  AORTA Ao Root diam: 2.60 cm MITRAL VALVE               TRICUSPID VALVE MV Area (PHT): 6.07 cm    TR Peak grad:   12.8 mmHg MV Decel Time: 125 msec    TR Vmax:        179.00 cm/s MV E velocity: 72.80 cm/s MV A velocity: 36.40 cm/s  SHUNTS MV E/A ratio:  2.00        Systemic VTI:  0.06 m                            Systemic Diam: 2.00 cm Julien Nordmann MD Electronically signed by Julien Nordmann MD Signature Date/Time: 08/01/2021/6:06:46 PM    Final (Updated)         Scheduled Meds:  folic acid  1 mg Oral Daily   metoprolol succinate  12.5 mg Oral Daily   multivitamin with minerals  1 tablet Oral Daily   pantoprazole (PROTONIX) IV  40 mg Intravenous Q12H   potassium chloride  40 mEq Oral Once   spironolactone  12.5 mg Oral Daily   thiamine  100 mg Oral Daily   Or   thiamine   100 mg Intravenous Daily   Continuous Infusions:  sodium chloride       LOS: 3 days    Time spent: 28 minutes    Marrion Coy, MD [facility]   To contact the attending provider between 7A-7P or the covering provider during after hours 7P-7A, please log into the web site [email or web address] and access using universal [place] password for that web site. If you do not have the password, please call the hospital operator.  08/03/2021, 11:42 AM

## 2021-08-03 NOTE — Progress Notes (Signed)
Progress Note  Patient Name: John Escobar Date of Encounter: 08/03/2021  Primary Cardiologist: Julien Nordmann, MD  Subjective   Noted some shortness of breath while trying to have a bowel movement last night.  Denies any chest pain.  Upset about his breakfast tray-juice does not taste good.  Most concerned about hoarseness, which has been going on for over a month.  Inpatient Medications    Scheduled Meds:  folic acid  1 mg Oral Daily   metoprolol succinate  12.5 mg Oral Daily   multivitamin with minerals  1 tablet Oral Daily   pantoprazole (PROTONIX) IV  40 mg Intravenous Q12H   thiamine  100 mg Oral Daily   Or   thiamine  100 mg Intravenous Daily   Continuous Infusions:  sodium chloride     PRN Meds: LORazepam **OR** LORazepam, ondansetron **OR** ondansetron (ZOFRAN) IV, simethicone   Vital Signs    Vitals:   08/02/21 2053 08/02/21 2326 08/03/21 0549 08/03/21 0724  BP: 113/81 98/73 99/74  99/76  Pulse: (!) 103  86 86  Resp: 16 18 16 16   Temp: 98 F (36.7 C) 98.2 F (36.8 C) 98.3 F (36.8 C) 98.3 F (36.8 C)  TempSrc: Oral Oral Oral Oral  SpO2: 100% 100% 98% 100%  Weight:      Height:        Intake/Output Summary (Last 24 hours) at 08/03/2021 0915 Last data filed at 08/03/2021 0356 Gross per 24 hour  Intake 848.75 ml  Output 2450 ml  Net -1601.25 ml   Filed Weights   07/31/21 1903 08/01/21 0151  Weight: 63.5 kg 66.7 kg    Physical Exam   GEN: Frail, in no acute distress.  HEENT: Grossly normal.  Neck: Supple, no JVD, carotid bruits, or masses. Cardiac: RRR, no murmurs, rubs, or gallops. No clubbing, cyanosis, edema.  Radials 2+, DP/PT 2+ and equal bilaterally.  Respiratory:  Respirations regular and unlabored, diminished breath sounds at bases. GI: Soft, nontender, nondistended, BS + x 4. MS: Right elbow swelling/bursitis.  Multiple bony abnormalities noted-hands, elbows, knees, feet.   Skin: warm and dry, no rash. Neuro:  Strength and  sensation are intact. Psych: AAOx3.  Flat affect -angry.  Labs    Chemistry Recent Labs  Lab 07/31/21 1911 08/01/21 0832 08/02/21 0210 08/03/21 0418  NA 133* 131* 133* 134*  K 4.6 4.4 3.7 3.4*  CL 105 101 104 102  CO2 17* 16* 21* 24  GLUCOSE 109* 101* 125* 99  BUN 36* 35* 36* 32*  CREATININE 1.92* 1.84* 2.07* 1.80*  CALCIUM 8.7* 8.7* 8.2* 8.3*  PROT 7.5 6.7  --   --   ALBUMIN 3.6 3.2*  --   --   AST 130* 117*  --   --   ALT 123* 123*  --   --   ALKPHOS 104 95  --   --   BILITOT 1.4* 2.7*  --   --   GFRNONAA 38* 40* 35* 41*  ANIONGAP 11 14 8 8      Hematology Recent Labs  Lab 08/01/21 0832 08/01/21 1424 08/02/21 0210 08/02/21 0732 08/03/21 0418  WBC 8.4  --  8.2  --  9.1  RBC 3.26*  --  3.24*  --  3.92*  HGB 8.0*   < > 7.4* 7.9* 9.2*  HCT 25.9*  --  25.4*  --  30.5*  MCV 79.4*  --  78.4*  --  77.8*  MCH 24.5*  --  22.8*  --  23.5*  MCHC 30.9  --  29.1*  --  30.2  RDW 19.7*  --  19.7*  --  21.6*  PLT 151  --  150  --  164   < > = values in this interval not displayed.    Cardiac Enzymes  Recent Labs  Lab 07/31/21 1911 08/01/21 0239  TROPONINIHS 152* 118*      BNP Recent Labs  Lab 08/01/21 0832  BNP >4,500.0*    Radiology    DG Elbow 2 Views Right  Result Date: 07/31/2021 CLINICAL DATA:  Right elbow pain, large mass posterior elbow EXAM: RIGHT ELBOW - 2 VIEW COMPARISON:  None. FINDINGS: Large soft tissue mass noted posteriorly with areas of internal calcification. This likely reflects enlarged bursa. No acute bony abnormality. Specifically, no fracture, subluxation, or dislocation. IMPRESSION: No acute bony abnormality. Enlarged bursa posterior to the right elbow. Electronically Signed   By: Charlett Nose M.D.   On: 07/31/2021 20:39   US Abdomen Complete  Result Date: 08/02/2021 CLINICAL DATA:  Elevated LFTs EXAM: ABDOMEN ULTRASOUND COMPLETE COMPARISON:  None. FINDINGS: Gallbladder: No gallstones or wall thickening visualized. No sonographic Murphy  sign noted by sonographer. Common bile duct: Diameter: 3 mm Liver: No focal lesion identified. Increased liver echogenicity. Portal vein is patent on color Doppler imaging with normal direction of blood flow towards the liver. IVC: No abnormality visualized. Pancreas: Visualized portion unremarkable. Spleen: Spleen volume is large at 14.1 x 10.3 x 8.1 cm calculated volume of 618 cc. Right Kidney: Length: 8.3. Echogenicity within normal limits. No mass or hydronephrosis visualized. Left Kidney: Length: 8.8. Echogenicity within normal limits. No mass or hydronephrosis visualized. Abdominal aorta: No aneurysm visualized. Other findings: Small bilateral pleural effusions IMPRESSION: 1. No acute findings in the abdomen. Normal gallbladder and common bile duct. 2. Increased liver echogenicity commonly represents hepatic steatosis. 3. Mild splenomegaly. 4. Small bilateral pleural effusions. Electronically Signed   By: Genevive Bi M.D.   On: 08/02/2021 10:19   DG Chest Portable 1 View  Result Date: 07/31/2021 CLINICAL DATA:  Shortness of breath EXAM: PORTABLE CHEST 1 VIEW COMPARISON:  11/28/2020 FINDINGS: Heart is upper limits normal in size. No confluent airspace opacities or effusions. No acute bony abnormality. IMPRESSION: No active disease. Electronically Signed   By: Charlett Nose M.D.   On: 07/31/2021 20:38   Telemetry    RSR - Personally Reviewed  Cardiac Studies   2D Echocardiogram 10.20.2022    1. Left ventricular ejection fraction, by estimation, is 20 to 25%. The  left ventricle has severely decreased function. The left ventricle has no  regional wall motion abnormalities. The left ventricular internal cavity  size was moderately dilated. Left  ventricular diastolic parameters are consistent with Grade II diastolic  dysfunction (pseudonormalization).   2. Right ventricular systolic function is low normal. The right  ventricular size is mildly enlarged. There is normal pulmonary artery   systolic pressure.   3. Left atrial size was mildly dilated.   4. The mitral valve is normal in structure. Moderate mitral valve  regurgitation.   5. Tricuspid valve regurgitation is moderate.   Patient Profile     65 y.o. male with a history of etoh abuse, GIB, PUD, COVID-19 infection (11/2020), and iron def anemia, who was admitted 10/19 w/ fatigue, severe anemia (hgb 5.0), and new CHF.  EF 20-25% by echo.  Assessment & Plan    1.  Acute on chronic iron deficiency anemia:  Prev eval in 11/2020 due to  dizziness, fall, and profound anemia w/ Hgb of 5.6.  GI w/u notable for erosions, gastritis, PUD, HH, and duodenal erosions.  Since lost to f/u.  Returned 10/19 due to fatigue w/ recurrent anemia and H/H 5.0/19.3.  Ferritin 8, Fe 16.  Status post 3 units of packed red blood cells.  Hemoglobin 7.9 this morning.  Seen by GI with plan for EGD/colonoscopy.  Remains on PPI and IV iron.  2.  Acute systolic congestive heart failure/cardiomyopathy: BNP greater than 4500 on presentation.  EF by echo October 20: 20-25% with grade 2 diastolic dysfunction, normal RV function, and moderate TR/MR.  He received a dose of Lasix yesterday following blood transfusion and appears euvolemic today.  Creatinine improved from 2.07-1.80 today.  We will add a low-dose beta-blocker yesterday, which thus far, he has tolerated, though pressures are trending in the high 90s.  We will add low-dose spironolactone, especially as he is hypokalemic.  We will have to follow blood pressures closely.  With soft blood pressures, not an ideal candidate for ACE/ARB/arni/sglt2i, though we will continue to follow and assess for opportunities to add.  We again discussed possible pursuit of diagnostic catheterization to rule out ischemic cause of cardiomyopathy.  Patient again does not want to entertain this conversation at this time and says the only thing he is concerned about is the hoarseness of his voice.  We will continue to discuss.  3.   Demand ischemia/abnormal EKG: In the setting of above, new inferolateral ST/T changes with abnormal R wave progression.  Troponins were mildly elevated at 152  118.  No history of chest pain but does have chronic dyspnea.  With LV dysfunction, ideally pursue diagnostic catheterization.  Currently complicated by anemia and CKD 3, though renal function improved this morning.  Further, patient not particular interested in pursuing invasive evaluation at this time.  We will continue to discuss.  Continue beta-blocker.  Lipids pending.  No aspirin or heparin in the setting of anemia.  4.  EtOH abuse: Says he was only drinking about 6 ounces of beer daily, though previously drank heavily.  Cessation advised.  5.  Stage III chronic kidney disease: Unclear baseline.  Discharge creatinine in February was 1.32.  Creatinine improved this morning at 1.80.  In the setting of heart failure/cardiomyopathy, will add low-dose spironolactone.  Follow.  6.  Abnormal LFTs: History of EtOH.  Question role of passive congestion/CHF.  7.  Hypokalemia: We will add spironolactone.  8.  Hoarseness: Patient reports a 1 month history of hoarseness of his voice and says that that is his only concern today.  He notes that he tried cigarettes when he was very young but otherwise was not a smoker.  Defer to internal medicine-consider imaging versus ENT eval.  Signed, Nicolasa Ducking, NP  08/03/2021, 9:15 AM    For questions or updates, please contact   Please consult [email or web address] for contact info under Cardiology/STEMI.

## 2021-08-04 ENCOUNTER — Encounter: Payer: Self-pay | Admitting: Internal Medicine

## 2021-08-04 DIAGNOSIS — D649 Anemia, unspecified: Secondary | ICD-10-CM | POA: Diagnosis not present

## 2021-08-04 DIAGNOSIS — N179 Acute kidney failure, unspecified: Secondary | ICD-10-CM | POA: Diagnosis not present

## 2021-08-04 LAB — CBC WITH DIFFERENTIAL/PLATELET
Abs Immature Granulocytes: 0.15 10*3/uL — ABNORMAL HIGH (ref 0.00–0.07)
Basophils Absolute: 0 10*3/uL (ref 0.0–0.1)
Basophils Relative: 0 %
Eosinophils Absolute: 0.2 10*3/uL (ref 0.0–0.5)
Eosinophils Relative: 2 %
HCT: 32.9 % — ABNORMAL LOW (ref 39.0–52.0)
Hemoglobin: 9.7 g/dL — ABNORMAL LOW (ref 13.0–17.0)
Immature Granulocytes: 2 %
Lymphocytes Relative: 11 %
Lymphs Abs: 1 10*3/uL (ref 0.7–4.0)
MCH: 24.1 pg — ABNORMAL LOW (ref 26.0–34.0)
MCHC: 29.5 g/dL — ABNORMAL LOW (ref 30.0–36.0)
MCV: 81.6 fL (ref 80.0–100.0)
Monocytes Absolute: 1.3 10*3/uL — ABNORMAL HIGH (ref 0.1–1.0)
Monocytes Relative: 15 %
Neutro Abs: 6.4 10*3/uL (ref 1.7–7.7)
Neutrophils Relative %: 70 %
Platelets: 166 10*3/uL (ref 150–400)
RBC: 4.03 MIL/uL — ABNORMAL LOW (ref 4.22–5.81)
RDW: 22.8 % — ABNORMAL HIGH (ref 11.5–15.5)
Smear Review: NORMAL
WBC: 9.1 10*3/uL (ref 4.0–10.5)
nRBC: 0 % (ref 0.0–0.2)

## 2021-08-04 LAB — BASIC METABOLIC PANEL
Anion gap: 9 (ref 5–15)
BUN: 33 mg/dL — ABNORMAL HIGH (ref 8–23)
CO2: 25 mmol/L (ref 22–32)
Calcium: 8.3 mg/dL — ABNORMAL LOW (ref 8.9–10.3)
Chloride: 98 mmol/L (ref 98–111)
Creatinine, Ser: 1.87 mg/dL — ABNORMAL HIGH (ref 0.61–1.24)
GFR, Estimated: 39 mL/min — ABNORMAL LOW (ref >60)
Glucose, Bld: 121 mg/dL — ABNORMAL HIGH (ref 70–99)
Potassium: 3.8 mmol/L (ref 3.5–5.1)
Sodium: 132 mmol/L — ABNORMAL LOW (ref 135–145)

## 2021-08-04 LAB — MAGNESIUM: Magnesium: 1.9 mg/dL (ref 1.7–2.4)

## 2021-08-04 NOTE — Progress Notes (Signed)
PROGRESS NOTE    John Escobar  IWL:[id number] DOB: [birth year] DOA: 07/31/2021 PCP: [facility]   Chief complaint.  Shortness of breath. Brief Narrative:  John Escobar is a 65 y.o. male with medical history significant of alcohol abuse, history of GI bleed with multiple ulcers back in February, COVID-19 infection in February, chronic anemia who presented to the ER with progressive weakness over the last couple of weeks.  Patient has also noted worsening lower extremity edema and some exertional dyspnea. Upon arriving the hospital, hemoglobin was 5.0.  Received 2 units PRBC. Also has acute on chronic systolic congestive heart failure, received a day of IV Lasix.  Echocardiogram showed ejection fraction 20 to 25%.   Assessment & Plan:   Principal Problem:   Symptomatic anemia Active Problems:   Generalized weakness   Lactic acidosis   Chronic alcohol abuse   Hyponatremia   AKI (acute kidney injury) (HCC)   Troponin I above reference range   Acute on chronic combined systolic and diastolic CHF (congestive heart failure) (HCC)   Acute HFrEF (heart failure with reduced ejection fraction) (HCC)   Symptomatic iron deficient anemia. Possible upper GI bleed secondary to AVM. Patient condition has been improving, hemoglobin is much better after giving 3 units of PRBC and IV iron. Discussed with GI, potential EGD already next week.  Acute on chronic combined systolic and diastolic congestive heart failure. 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 Elevated troponin secondary to congestive heart failure. Patient is a seen by cardiology, continue IV Lasix, Aldactone, lower dose beta-blocker.  Acute on chronic kidney disease stage IIIa. Hyponatremia. Hypokalemia. Renal function has stabilized and better.  Alcohol abuse. Alcoholic hepatitis. No evidence of alcohol withdrawal.   DVT prophylaxis: SCDs Code Status: Full Family Communication:   Disposition Plan:      Status is: Inpatient   Remains inpatient appropriate because: For medical problems including acute on chronic congestive heart failure.  He is requiring IV Lasix, he also requiring decision about EGD     I/O last 3 completed shifts: In: 932.8 [P.O.:895; I.V.:37.8] Out: 3840 [Urine:3840] Total I/O In: -  Out: 300 [Urine:300]    Consultants:  GI, cardiology   Procedures: None   Antimicrobials: None    Subjective: Patient states that he was woken up by nurse frequently at nighttime.  Short of breath is better, no leg edema. No abdominal pain or nausea vomiting, no constipation. No fever chills pain No dysuria hematuria.  Objective: Vitals:   08/03/21 2045 08/04/21 0200 08/04/21 0605 08/04/21 0729  BP: 94/75 97/68 94/72  105/77  Pulse: 89 76 85 88  Resp: 18  16 18   Temp: 97.9 F (36.6 C)  98 F (36.7 C) 99 F (37.2 C)  TempSrc: Oral  Oral   SpO2: 98%  98% 100%  Weight:      Height:        Intake/Output Summary (Last 24 hours) at 08/04/2021 1005 Last data filed at 08/04/2021 0825 Gross per 24 hour  Intake 777 ml  Output 1690 ml  Net -913 ml   Filed Weights   07/31/21 1903 08/01/21 0151  Weight: 63.5 kg 66.7 kg    Examination:  General exam: Appears calm and comfortable  Respiratory system: Clear to auscultation. Respiratory effort normal. Cardiovascular system: S1 & S2 heard, RRR. No JVD, murmurs, rubs, gallops or clicks. No pedal edema. Gastrointestinal system: Abdomen is nondistended, soft and nontender. No organomegaly or masses felt. Normal bowel sounds heard. Central  nervous system: Alert and oriented. No focal neurological deficits. Extremities: Symmetric 5 x 5 power. Skin: No rashes, lesions or ulcers Psychiatry: Judgement and insight appear normal. Mood & affect appropriate.     Data Reviewed: I have personally reviewed following labs and imaging studies  CBC: Recent Labs  Lab 07/31/21 1911 08/01/21 0239  08/01/21 0832 08/01/21 1424 08/01/21 1955 08/02/21 0210 08/02/21 0732 08/03/21 0418 08/04/21 0425  WBC 9.5  --  8.4  --   --  8.2  --  9.1 9.1  NEUTROABS 7.7  --   --   --   --  6.2  --  7.1 6.4  HGB 5.0*  --  8.0*   < > 7.4* 7.4* 7.9* 9.2* 9.7*  HCT 19.3* 25.7* 25.9*  --   --  25.4*  --  30.5* 32.9*  MCV 78.5*  --  79.4*  --   --  78.4*  --  77.8* 81.6  PLT 181  --  151  --   --  150  --  164 166   < > = values in this interval not displayed.   Basic Metabolic Panel: Recent Labs  Lab 07/31/21 1911 08/01/21 0832 08/02/21 0210 08/03/21 0418 08/04/21 0425  NA 133* 131* 133* 134* 132*  K 4.6 4.4 3.7 3.4* 3.8  CL 105 101 104 102 98  CO2 17* 16* 21* 24 25  GLUCOSE 109* 101* 125* 99 121*  BUN 36* 35* 36* 32* 33*  CREATININE 1.92* 1.84* 2.07* 1.80* 1.87*  CALCIUM 8.7* 8.7* 8.2* 8.3* 8.3*  MG  --   --  1.8 1.8 1.9  PHOS  --   --  3.8  --   --    GFR: Estimated Creatinine Clearance: 37.2 mL/min (A) (by C-G formula based on SCr of 1.87 mg/dL (H)). Liver Function Tests: Recent Labs  Lab 07/31/21 1911 08/01/21 0832  AST 130* 117*  ALT 123* 123*  ALKPHOS 104 95  BILITOT 1.4* 2.7*  PROT 7.5 6.7  ALBUMIN 3.6 3.2*   Recent Labs  Lab 07/31/21 1911  LIPASE 43   No results for input(s): AMMONIA in the last 168 hours. Coagulation Profile: No results for input(s): INR, PROTIME in the last 168 hours. Cardiac Enzymes: No results for input(s): CKTOTAL, CKMB, CKMBINDEX, TROPONINI in the last 168 hours. BNP (last 3 results) No results for input(s): PROBNP in the last 8760 hours. HbA1C: No results for input(s): HGBA1C in the last 72 hours. CBG: No results for input(s): GLUCAP in the last 168 hours. Lipid Profile: No results for input(s): CHOL, HDL, LDLCALC, TRIG, CHOLHDL, LDLDIRECT in the last 72 hours. Thyroid Function Tests: No results for input(s): TSH, T4TOTAL, FREET4, T3FREE, THYROIDAB in the last 72 hours. Anemia Panel: No results for input(s): VITAMINB12, FOLATE,  FERRITIN, TIBC, IRON, RETICCTPCT in the last 72 hours. Sepsis Labs: Recent Labs  Lab 07/31/21 1911 08/01/21 0832  LATICACIDVEN 2.7* 2.0*    Recent Results (from the past 240 hour(s))  Resp Panel by RT-PCR (Flu A&B, Covid) Nasopharyngeal Swab     Status: None   Collection Time: 07/31/21 10:28 PM   Specimen: Nasopharyngeal Swab; Nasopharyngeal(NP) swabs in vial transport medium  Result Value Ref Range Status   SARS Coronavirus 2 by RT PCR NEGATIVE NEGATIVE Final    Comment: (NOTE) SARS-CoV-2 target nucleic acids are NOT DETECTED.  The SARS-CoV-2 RNA is generally detectable in upper respiratory specimens during the acute phase of infection. The lowest concentration of SARS-CoV-2 viral copies this assay  can detect is 138 copies/mL. A negative result does not preclude SARS-Cov-2 infection and should not be used as the sole basis for treatment or other patient management decisions. A negative result may occur with  improper specimen collection/handling, submission of specimen other than nasopharyngeal swab, presence of viral mutation(s) within the areas targeted by this assay, and inadequate number of viral copies(<138 copies/mL). A negative result must be combined with clinical observations, patient history, and epidemiological information. The expected result is Negative.  Fact Sheet for Patients:  BloggerCourse.com  Fact Sheet for Healthcare Providers:  SeriousBroker.it  This test is no t yet approved or cleared by the [place] FDA and  has been authorized for detection and/or diagnosis of SARS-CoV-2 by FDA under an Emergency Use Authorization (EUA). This EUA will remain  in effect (meaning this test can be used) for the duration of the COVID-19 declaration under Section 564(b)(1) of the Act, 21 U.S.C.section 360bbb-3(b)(1), unless the authorization is terminated  or revoked sooner.       Influenza A by PCR NEGATIVE  NEGATIVE Final   Influenza B by PCR NEGATIVE NEGATIVE Final    Comment: (NOTE) The Xpert Xpress SARS-CoV-2/FLU/RSV plus assay is intended as an aid in the diagnosis of influenza from Nasopharyngeal swab specimens and should not be used as a sole basis for treatment. Nasal washings and aspirates are unacceptable for Xpert Xpress SARS-CoV-2/FLU/RSV testing.  Fact Sheet for Patients: BloggerCourse.com  Fact Sheet for Healthcare Providers: SeriousBroker.it  This test is not yet approved or cleared by the [place] FDA and has been authorized for detection and/or diagnosis of SARS-CoV-2 by FDA under an Emergency Use Authorization (EUA). This EUA will remain in effect (meaning this test can be used) for the duration of the COVID-19 declaration under Section 564(b)(1) of the Act, 21 U.S.C. section 360bbb-3(b)(1), unless the authorization is terminated or revoked.  Performed at [facility], [address] [place], [place] [address]          Radiology Studies: No results found.      Scheduled Meds:  folic acid  1 mg Oral Daily   furosemide  40 mg Intravenous Daily   metoprolol succinate  12.5 mg Oral Daily   multivitamin with minerals  1 tablet Oral Daily   pantoprazole (PROTONIX) IV  40 mg Intravenous Q12H   spironolactone  12.5 mg Oral Daily   thiamine  100 mg Oral Daily   Or   thiamine  100 mg Intravenous Daily   Continuous Infusions:  sodium chloride       LOS: 4 days    Time spent: 26 minutes    Marrion Coy, MD [facility]   To contact the attending provider between 7A-7P or the covering provider during after hours 7P-7A, please log into the web site [email or web address] and access using universal [place] password for that web site. If you do not have the password, please call the hospital operator.  08/04/2021, 10:05 AM

## 2021-08-04 NOTE — Progress Notes (Signed)
Progress Note  Patient Name: John Escobar Date of Encounter: 08/04/2021  [facility] Cardiologist: [facility]  Subjective   Patient complaining about Of inadequate pain medication, food is not good, Boredom, among other issues On discussion of potential cardiac catheterization, " I am not having that ,I told you already"  Inpatient Medications    Scheduled Meds:  folic acid  1 mg Oral Daily   furosemide  40 mg Intravenous Daily   metoprolol succinate  12.5 mg Oral Daily   multivitamin with minerals  1 tablet Oral Daily   pantoprazole (PROTONIX) IV  40 mg Intravenous Q12H   spironolactone  12.5 mg Oral Daily   thiamine  100 mg Oral Daily   Or   thiamine  100 mg Intravenous Daily   Continuous Infusions:  sodium chloride     PRN Meds: ondansetron **OR** ondansetron (ZOFRAN) IV, oxyCODONE-acetaminophen, simethicone   Vital Signs    Vitals:   08/04/21 0200 08/04/21 0605 08/04/21 0729 08/04/21 1154  BP: 97/68 94/72 105/77 107/73  Pulse: 76 85 88 88  Resp:  16 18 16   Temp:  98 F (36.7 C) 99 F (37.2 C) 99.5 F (37.5 C)  TempSrc:  Oral  Oral  SpO2:  98% 100% 100%  Weight:      Height:        Intake/Output Summary (Last 24 hours) at 08/04/2021 1426 Last data filed at 08/04/2021 1157 Gross per 24 hour  Intake 597 ml  Output 2390 ml  Net -1793 ml   Last 3 Weights 08/01/2021 07/31/2021 11/30/2020  Weight (lbs) 147 lb 0.8 oz 140 lb 142 lb 10.2 oz  Weight (kg) 66.7 kg 63.504 kg 64.7 kg      Telemetry    Normal sinus rhythm- Personally Reviewed  ECG     - Personally Reviewed  Physical Exam   GEN: No acute distress.  Thin Neck: No JVD Cardiac: RRR, no murmurs, rubs, or gallops.  Respiratory: Clear to auscultation bilaterally. GI: Soft, nontender, non-distended  MS: No edema; No deformity. Neuro:  Nonfocal  Psych: Normal affect   Labs    High Sensitivity Troponin:   Recent Labs  Lab 07/31/21 1911 08/01/21 0239  TROPONINIHS 152* 118*      Chemistry Recent Labs  Lab 07/31/21 1911 08/01/21 0832 08/02/21 0210 08/03/21 0418 08/04/21 0425  NA 133* 131* 133* 134* 132*  K 4.6 4.4 3.7 3.4* 3.8  CL 105 101 104 102 98  CO2 17* 16* 21* 24 25  GLUCOSE 109* 101* 125* 99 121*  BUN 36* 35* 36* 32* 33*  CREATININE 1.92* 1.84* 2.07* 1.80* 1.87*  CALCIUM 8.7* 8.7* 8.2* 8.3* 8.3*  MG  --   --  1.8 1.8 1.9  PROT 7.5 6.7  --   --   --   ALBUMIN 3.6 3.2*  --   --   --   AST 130* 117*  --   --   --   ALT 123* 123*  --   --   --   ALKPHOS 104 95  --   --   --   BILITOT 1.4* 2.7*  --   --   --   GFRNONAA 38* 40* 35* 41* 39*  ANIONGAP 11 14 8 8 9     Lipids No results for input(s): CHOL, TRIG, HDL, LABVLDL, LDLCALC, CHOLHDL in the last 168 hours.  Hematology Recent Labs  Lab 08/02/21 0210 08/02/21 0732 08/03/21 0418 08/04/21 0425  WBC 8.2  --  9.1 9.1  RBC 3.24*  --  3.92* 4.03*  HGB 7.4* 7.9* 9.2* 9.7*  HCT 25.4*  --  30.5* 32.9*  MCV 78.4*  --  77.8* 81.6  MCH 22.8*  --  23.5* 24.1*  MCHC 29.1*  --  30.2 29.5*  RDW 19.7*  --  21.6* 22.8*  PLT 150  --  164 166   Thyroid No results for input(s): TSH, FREET4 in the last 168 hours.  BNP Recent Labs  Lab 08/01/21 0832  BNP >4,500.0*    DDimer No results for input(s): DDIMER in the last 168 hours.   Radiology    No results found.  Cardiac Studies   Echocardiogram  1. Left ventricular ejection fraction, by estimation, is 20 to 25%. The  left ventricle has severely decreased function. The left ventricle has no  regional wall motion abnormalities. The left ventricular internal cavity  size was moderately dilated. Left  ventricular diastolic parameters are consistent with Grade II diastolic  dysfunction (pseudonormalization).   2. Right ventricular systolic function is low normal. The right  ventricular size is mildly enlarged. There is normal pulmonary artery  systolic pressure.   3. Left atrial size was mildly dilated.   4. The mitral valve is normal in  structure. Moderate mitral valve  regurgitation.   5. Tricuspid valve regurgitation is moderate.   Patient Profile     65 y.o. male with a history of etoh abuse, GIB, PUD, COVID-19 infection (11/2020), and iron def anemia, who was admitted 10/19 w/ fatigue, severe anemia (hgb 5.0), and new CHF.  EF 20-25% by echo.  Assessment & Plan    Preop cardiovascular evaluation Potential EGD for GI bleed High risk given severe cardiomyopathy, high likelihood of underlying multivessel disease He is refusing cardiac work-up including cardiac catheterization -We have discussed with him the risk and benefit of GI procedure, he was noncommittal whether he was willing to accept the risk and undergo the procedure. " They told me it was scheduled for tomorrow!" -We recommend he talk with GI and anesthesia to see if procedure is indicated  2.   Acute on chronic iron deficiency anemia:   Presenting with profound anemia, likely iron deficient, unable to exclude blood clots anemia  Remains on PPI  Hemoglobin stable   3.  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   EF by echo October 20: 20-25% moderate MR TR -- Suspected cardiorenal syndrome -Low-dose beta-blocker, low-dose spironolactone,  not an ideal candidate for ACE/ARB/arni/sglt2i, given low blood pressure  Refusing ischemic work-up/catheterization -Continue Lasix 40 IV daily  4.  EtOH abuse:   Cessation advised.  5.  Stage III chronic kidney disease:  Unclear baseline.  Discharge creatinine in February was 1.32.   Creatinine elevated but stable 1.80.  Suspected cardiorenal syndrome  6.  Abnormal LFTs:  History of EtOH.     7.  Hypokalemia:  spironolactone.    Total encounter time more than 25 minutes  Greater than 50% was spent in counseling and coordination of care with the patient   For questions or updates, please  contact [facility] Please consult [email or web address] for contact info under        Signed, Julien Nordmann, MD  08/04/2021, 2:26 PM

## 2021-08-05 DIAGNOSIS — N179 Acute kidney failure, unspecified: Secondary | ICD-10-CM | POA: Diagnosis not present

## 2021-08-05 DIAGNOSIS — D649 Anemia, unspecified: Secondary | ICD-10-CM | POA: Diagnosis not present

## 2021-08-05 DIAGNOSIS — I5043 Acute on chronic combined systolic (congestive) and diastolic (congestive) heart failure: Secondary | ICD-10-CM | POA: Diagnosis not present

## 2021-08-05 LAB — CBC WITH DIFFERENTIAL/PLATELET
Abs Immature Granulocytes: 0.11 10*3/uL — ABNORMAL HIGH (ref 0.00–0.07)
Basophils Absolute: 0 10*3/uL (ref 0.0–0.1)
Basophils Relative: 0 %
Eosinophils Absolute: 0.1 10*3/uL (ref 0.0–0.5)
Eosinophils Relative: 1 %
HCT: 32.7 % — ABNORMAL LOW (ref 39.0–52.0)
Hemoglobin: 10 g/dL — ABNORMAL LOW (ref 13.0–17.0)
Immature Granulocytes: 1 %
Lymphocytes Relative: 6 %
Lymphs Abs: 0.6 10*3/uL — ABNORMAL LOW (ref 0.7–4.0)
MCH: 25.2 pg — ABNORMAL LOW (ref 26.0–34.0)
MCHC: 30.6 g/dL (ref 30.0–36.0)
MCV: 82.4 fL (ref 80.0–100.0)
Monocytes Absolute: 1.2 10*3/uL — ABNORMAL HIGH (ref 0.1–1.0)
Monocytes Relative: 11 %
Neutro Abs: 8.7 10*3/uL — ABNORMAL HIGH (ref 1.7–7.7)
Neutrophils Relative %: 81 %
Platelets: 182 10*3/uL (ref 150–400)
RBC: 3.97 MIL/uL — ABNORMAL LOW (ref 4.22–5.81)
RDW: 24 % — ABNORMAL HIGH (ref 11.5–15.5)
Smear Review: NORMAL
WBC: 10.6 10*3/uL — ABNORMAL HIGH (ref 4.0–10.5)
nRBC: 0 % (ref 0.0–0.2)

## 2021-08-05 LAB — BASIC METABOLIC PANEL
Anion gap: 10 (ref 5–15)
BUN: 34 mg/dL — ABNORMAL HIGH (ref 8–23)
CO2: 26 mmol/L (ref 22–32)
Calcium: 8.5 mg/dL — ABNORMAL LOW (ref 8.9–10.3)
Chloride: 95 mmol/L — ABNORMAL LOW (ref 98–111)
Creatinine, Ser: 2.14 mg/dL — ABNORMAL HIGH (ref 0.61–1.24)
GFR, Estimated: 34 mL/min — ABNORMAL LOW (ref >60)
Glucose, Bld: 107 mg/dL — ABNORMAL HIGH (ref 70–99)
Potassium: 3.7 mmol/L (ref 3.5–5.1)
Sodium: 131 mmol/L — ABNORMAL LOW (ref 135–145)

## 2021-08-05 LAB — MAGNESIUM: Magnesium: 1.6 mg/dL — ABNORMAL LOW (ref 1.7–2.4)

## 2021-08-05 MED ORDER — PEG 3350-KCL-NA BICARB-NACL 420 G PO SOLR
4000.0000 mL | Freq: Once | ORAL | Status: AC
  Administered 2021-08-05: 17:00:00 4000 mL via ORAL
  Filled 2021-08-05: qty 4000

## 2021-08-05 MED ORDER — MAGNESIUM SULFATE 2 GM/50ML IV SOLN
2.0000 g | Freq: Once | INTRAVENOUS | Status: AC
  Administered 2021-08-05: 09:00:00 2 g via INTRAVENOUS
  Filled 2021-08-05: qty 50

## 2021-08-05 NOTE — Progress Notes (Addendum)
Patient refusing Golytely prep at this time, educated regarding necessity of medication for planned colonoscopy tomorrow. Continuing to refuse, will continue to reinforce education and encourage prep.   2330 Addendum - Patient continuing to refuse GI prep despite education and encouragement. States "I'm not touching that shit." Able to endorse back to this RN that not completing the prep likely means his GI procedures will not be done in the morning.   Addendum 0500 - Did not drink any prep overnight.

## 2021-08-05 NOTE — Progress Notes (Signed)
Progress Note  Patient Name: John Escobar Date of Encounter: 08/05/2021  [facility] Cardiologist: [facility]  Subjective   He reports shortness of breath when he walks to the bathroom but no chest pain.  He wants to proceed with colonoscopy tomorrow morning if it is done early in the morning.  Inpatient Medications    Scheduled Meds:  folic acid  1 mg Oral Daily   furosemide  40 mg Intravenous Daily   metoprolol succinate  12.5 mg Oral Daily   multivitamin with minerals  1 tablet Oral Daily   pantoprazole (PROTONIX) IV  40 mg Intravenous Q12H   polyethylene glycol-electrolytes  4,000 mL Oral Once   spironolactone  12.5 mg Oral Daily   thiamine  100 mg Oral Daily   Or   thiamine  100 mg Intravenous Daily   Continuous Infusions:  sodium chloride     PRN Meds: ondansetron **OR** ondansetron (ZOFRAN) IV, oxyCODONE-acetaminophen, simethicone   Vital Signs    Vitals:   08/04/21 2316 08/05/21 0345 08/05/21 0850 08/05/21 1131  BP: 96/77 102/76 105/85 101/68  Pulse: 92 91 93 92  Resp: 18 11 17 18   Temp: 98.4 F (36.9 C) 98.1 F (36.7 C) 98.5 F (36.9 C) 98.6 F (37 C)  TempSrc: Oral Oral Oral Oral  SpO2: 99% 100% 100% 100%  Weight:      Height:        Intake/Output Summary (Last 24 hours) at 08/05/2021 1713 Last data filed at 08/05/2021 1601 Gross per 24 hour  Intake 476 ml  Output 2450 ml  Net -1974 ml    Last 3 Weights 08/01/2021 07/31/2021 11/30/2020  Weight (lbs) 147 lb 0.8 oz 140 lb 142 lb 10.2 oz  Weight (kg) 66.7 kg 63.504 kg 64.7 kg      Telemetry    Normal sinus rhythm- Personally Reviewed  ECG     - Personally Reviewed  Physical Exam   GEN: No acute distress.  Thin Neck: No JVD Cardiac: RRR, no murmurs, rubs, or gallops.  Respiratory: Clear to auscultation bilaterally. GI: Soft, nontender, non-distended  MS: No edema; No deformity. Neuro:  Nonfocal  Psych: Normal affect   Labs    High Sensitivity Troponin:   Recent  Labs  Lab 07/31/21 1911 08/01/21 0239  TROPONINIHS 152* 118*      Chemistry Recent Labs  Lab 07/31/21 1911 08/01/21 0832 08/02/21 0210 08/03/21 0418 08/04/21 0425 08/05/21 0420  NA 133* 131*   < > 134* 132* 131*  K 4.6 4.4   < > 3.4* 3.8 3.7  CL 105 101   < > 102 98 95*  CO2 17* 16*   < > 24 25 26   GLUCOSE 109* 101*   < > 99 121* 107*  BUN 36* 35*   < > 32* 33* 34*  CREATININE 1.92* 1.84*   < > 1.80* 1.87* 2.14*  CALCIUM 8.7* 8.7*   < > 8.3* 8.3* 8.5*  MG  --   --    < > 1.8 1.9 1.6*  PROT 7.5 6.7  --   --   --   --   ALBUMIN 3.6 3.2*  --   --   --   --   AST 130* 117*  --   --   --   --   ALT 123* 123*  --   --   --   --   ALKPHOS 104 95  --   --   --   --  BILITOT 1.4* 2.7*  --   --   --   --   GFRNONAA 38* 40*   < > 41* 39* 34*  ANIONGAP 11 14   < > 8 9 10    < > = values in this interval not displayed.     Lipids No results for input(s): CHOL, TRIG, HDL, LABVLDL, LDLCALC, CHOLHDL in the last 168 hours.  Hematology Recent Labs  Lab 08/03/21 0418 08/04/21 0425 08/05/21 0627  WBC 9.1 9.1 10.6*  RBC 3.92* 4.03* 3.97*  HGB 9.2* 9.7* 10.0*  HCT 30.5* 32.9* 32.7*  MCV 77.8* 81.6 82.4  MCH 23.5* 24.1* 25.2*  MCHC 30.2 29.5* 30.6  RDW 21.6* 22.8* 24.0*  PLT 164 166 182    Thyroid No results for input(s): TSH, FREET4 in the last 168 hours.  BNP Recent Labs  Lab 08/01/21 0832  BNP >4,500.0*     DDimer No results for input(s): DDIMER in the last 168 hours.   Radiology    No results found.  Cardiac Studies   Echocardiogram  1. Left ventricular ejection fraction, by estimation, is 20 to 25%. The  left ventricle has severely decreased function. The left ventricle has no  regional wall motion abnormalities. The left ventricular internal cavity  size was moderately dilated. Left  ventricular diastolic parameters are consistent with Grade II diastolic  dysfunction (pseudonormalization).   2. Right ventricular systolic function is low normal. The right   ventricular size is mildly enlarged. There is normal pulmonary artery  systolic pressure.   3. Left atrial size was mildly dilated.   4. The mitral valve is normal in structure. Moderate mitral valve  regurgitation.   5. Tricuspid valve regurgitation is moderate.   Patient Profile     65 y.o. male with a history of etoh abuse, GIB, PUD, COVID-19 infection (11/2020), and iron def anemia, who was admitted 10/19 w/ fatigue, severe anemia (hgb 5.0), and new CHF.  EF 20-25% by echo.  Assessment & Plan    1. Preop cardiovascular evaluation Patient is to undergo colonoscopy tomorrow. He does have cardiomyopathy and an abnormal EKG suggestive of possible underlying CAD.  However, he does not have unstable symptoms at the present time.  Ideally, he should undergo a left heart catheterization to clarify his coronary anatomy and underlying coronary artery disease.  However, probably best to finish he procedures and correct any potential causes to decrease risk of bleeding with cardiac procedures. The patient can proceed with colonoscopy at an overall moderate to high risk.  2.   Acute on chronic iron deficiency anemia:   Presenting with profound anemia, likely iron deficient, unable to exclude blood clots anemia  Remains on PPI  Hemoglobin stable   3.  Acute systolic congestive heart failure/cardiomyopathy:  The patient's EKG is abnormal with significant ST depression suggestive of underlying coronary artery disease.  There is also history of excessive alcohol use raising the possibility of alcohol induced cardiomyopathy. BNP greater than 4500 on presentation.   EF by echo October 20: 20-25% moderate MR TR  -Low-dose beta-blocker, low-dose spironolactone,  not an ideal candidate for ACE/ARB/arni/sglt2i, given low blood pressure  -Can likely switch to oral furosemide tomorrow.  Consider an outpatient right and left cardiac catheterization although this might be limited by the patient's lack of  consent.  4.  EtOH abuse:   Cessation advised.  5.  Stage III chronic kidney disease:  Unclear baseline.  Discharge creatinine in February was 1.32.   Creatinine elevated but  stable 1.80.  Suspected cardiorenal syndrome  6.  Abnormal LFTs:  History of EtOH.     7.  Hypokalemia:  spironolactone.    Total encounter time more than 25 minutes  Greater than 50% was spent in counseling and coordination of care with the patient   For questions or updates, please contact [facility] Please consult [email or web address] for contact info under        Signed, Lorine Bears, MD  08/05/2021, 5:13 PM

## 2021-08-05 NOTE — Progress Notes (Signed)
PROGRESS NOTE    John Escobar  RUE:[id number] DOB: 1956-02-28 DOA: 07/31/2021 PCP: [facility]   Chief complaint for shortness of breath. Brief Narrative:  John Escobar is a 65 y.o. male with medical history significant of alcohol abuse, history of GI bleed with multiple ulcers back in February, COVID-19 infection in February, chronic anemia who presented to the ER with progressive weakness over the last couple of weeks.  Patient has also noted worsening lower extremity edema and some exertional dyspnea. Upon arriving the hospital, hemoglobin was 5.0.  Received 2 units PRBC. Also has acute on chronic systolic congestive heart failure, received a day of IV Lasix.  Echocardiogram showed ejection fraction 20 to 25%.   Assessment & Plan:   Principal Problem:   Symptomatic anemia Active Problems:   Generalized weakness   Lactic acidosis   Chronic alcohol abuse   Hyponatremia   AKI (acute kidney injury) (HCC)   Troponin I above reference range   Acute on chronic combined systolic and diastolic CHF (congestive heart failure) (HCC)   Acute HFrEF (heart failure with reduced ejection fraction) (HCC)   Symptomatic iron deficient anemia. Possible upper GI bleed secondary to AVM. Patient hemoglobin has been stable after giving 3 units PRBC and IV iron. Communicated with GI, patient is more stable now for EGD.  Acute on chronic combined systolic and diastolic congestive heart failure. 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 Elevated troponin secondary to congestive heart failure. Patient has been seen by cardiology, renal function is worse today, will need to adjust her diuretics.  Acute on chronic kidney disease stage IIIa. Hyponatremia. Hypokalemia. Renal function slightly worse after diuretics.  Continue to follow.   Alcohol abuse. Alcoholic hepatitis. Conditions are stable.   DVT prophylaxis: SCDs Code Status: Full Family  Communication:  Disposition Plan:      Status is: Inpatient   Remains inpatient appropriate because: For medical problems including acute on chronic congestive heart failure.  He is requiring IV Lasix, he also requiring decision about EGD      I/O last 3 completed shifts: In: 1073 [P.O.:1073] Out: 4540 [Urine:4540] No intake/output data recorded.   Consultants:  GI, cardiology   Procedures: None   Antimicrobials: None     Subjective: Patient slept better last night, did not have any paroxysmal nocturnal dyspnea.  Still has some short of breath when he walked to the bathroom, no edema. Denies any abdominal pain or nausea vomiting. No dysuria or hematuria. No fever or chills.  Objective: Vitals:   08/04/21 1939 08/04/21 2316 08/05/21 0345 08/05/21 0850  BP: 99/65 96/77 102/76 105/85  Pulse: 95 92 91 93  Resp: 18 18 11 17   Temp: 98.6 F (37 C) 98.4 F (36.9 C) 98.1 F (36.7 C) 98.5 F (36.9 C)  TempSrc: Oral Oral Oral Oral  SpO2: 100% 99% 100% 100%  Weight:      Height:        Intake/Output Summary (Last 24 hours) at 08/05/2021 1047 Last data filed at 08/05/2021 0600 Gross per 24 hour  Intake 656 ml  Output 3200 ml  Net -2544 ml   Filed Weights   07/31/21 1903 08/01/21 0151  Weight: 63.5 kg 66.7 kg    Examination:  General exam: Appears calm and comfortable  Respiratory system: Clear to auscultation. Respiratory effort normal. Cardiovascular system: S1 & S2 heard, RRR. No JVD, murmurs, rubs, gallops or clicks. No pedal edema. Gastrointestinal system: Abdomen is nondistended, soft and nontender. No  organomegaly or masses felt. Normal bowel sounds heard. Central nervous system: Alert and oriented x3. No focal neurological deficits. Extremities: Symmetric 5 x 5 power. Skin: No rashes, lesions or ulcers Psychiatry: Mood & affect appropriate.     Data Reviewed: I have personally reviewed following labs and imaging studies  CBC: Recent Labs  Lab  07/31/21 1911 08/01/21 0239 08/01/21 4076 08/01/21 1424 08/02/21 0210 08/02/21 0732 08/03/21 0418 08/04/21 0425 08/05/21 0627  WBC 9.5  --  8.4  --  8.2  --  9.1 9.1 10.6*  NEUTROABS 7.7  --   --   --  6.2  --  7.1 6.4 8.7*  HGB 5.0*  --  8.0*   < > 7.4* 7.9* 9.2* 9.7* 10.0*  HCT 19.3*   < > 25.9*  --  25.4*  --  30.5* 32.9* 32.7*  MCV 78.5*  --  79.4*  --  78.4*  --  77.8* 81.6 82.4  PLT 181  --  151  --  150  --  164 166 182   < > = values in this interval not displayed.   Basic Metabolic Panel: Recent Labs  Lab 08/01/21 0832 08/02/21 0210 08/03/21 0418 08/04/21 0425 08/05/21 0420  NA 131* 133* 134* 132* 131*  K 4.4 3.7 3.4* 3.8 3.7  CL 101 104 102 98 95*  CO2 16* 21* 24 25 26   GLUCOSE 101* 125* 99 121* 107*  BUN 35* 36* 32* 33* 34*  CREATININE 1.84* 2.07* 1.80* 1.87* 2.14*  CALCIUM 8.7* 8.2* 8.3* 8.3* 8.5*  MG  --  1.8 1.8 1.9 1.6*  PHOS  --  3.8  --   --   --    GFR: Estimated Creatinine Clearance: 32.5 mL/min (A) (by C-G formula based on SCr of 2.14 mg/dL (H)). Liver Function Tests: Recent Labs  Lab 07/31/21 1911 08/01/21 0832  AST 130* 117*  ALT 123* 123*  ALKPHOS 104 95  BILITOT 1.4* 2.7*  PROT 7.5 6.7  ALBUMIN 3.6 3.2*   Recent Labs  Lab 07/31/21 1911  LIPASE 43   No results for input(s): AMMONIA in the last 168 hours. Coagulation Profile: No results for input(s): INR, PROTIME in the last 168 hours. Cardiac Enzymes: No results for input(s): CKTOTAL, CKMB, CKMBINDEX, TROPONINI in the last 168 hours. BNP (last 3 results) No results for input(s): PROBNP in the last 8760 hours. HbA1C: No results for input(s): HGBA1C in the last 72 hours. CBG: No results for input(s): GLUCAP in the last 168 hours. Lipid Profile: No results for input(s): CHOL, HDL, LDLCALC, TRIG, CHOLHDL, LDLDIRECT in the last 72 hours. Thyroid Function Tests: No results for input(s): TSH, T4TOTAL, FREET4, T3FREE, THYROIDAB in the last 72 hours. Anemia Panel: No results for  input(s): VITAMINB12, FOLATE, FERRITIN, TIBC, IRON, RETICCTPCT in the last 72 hours. Sepsis Labs: Recent Labs  Lab 07/31/21 1911 08/01/21 0832  LATICACIDVEN 2.7* 2.0*    Recent Results (from the past 240 hour(s))  Resp Panel by RT-PCR (Flu A&B, Covid) Nasopharyngeal Swab     Status: None   Collection Time: 07/31/21 10:28 PM   Specimen: Nasopharyngeal Swab; Nasopharyngeal(NP) swabs in vial transport medium  Result Value Ref Range Status   SARS Coronavirus 2 by RT PCR NEGATIVE NEGATIVE Final    Comment: (NOTE) SARS-CoV-2 target nucleic acids are NOT DETECTED.  The SARS-CoV-2 RNA is generally detectable in upper respiratory specimens during the acute phase of infection. The lowest concentration of SARS-CoV-2 viral copies this assay can detect is 138  copies/mL. A negative result does not preclude SARS-Cov-2 infection and should not be used as the sole basis for treatment or other patient management decisions. A negative result may occur with  improper specimen collection/handling, submission of specimen other than nasopharyngeal swab, presence of viral mutation(s) within the areas targeted by this assay, and inadequate number of viral copies(<138 copies/mL). A negative result must be combined with clinical observations, patient history, and epidemiological information. The expected result is Negative.  Fact Sheet for Patients:  BloggerCourse.com  Fact Sheet for Healthcare Providers:  SeriousBroker.it  This test is no t yet approved or cleared by the [place] FDA and  has been authorized for detection and/or diagnosis of SARS-CoV-2 by FDA under an Emergency Use Authorization (EUA). This EUA will remain  in effect (meaning this test can be used) for the duration of the COVID-19 declaration under Section 564(b)(1) of the Act, 21 U.S.C.section 360bbb-3(b)(1), unless the authorization is terminated  or revoked sooner.        Influenza A by PCR NEGATIVE NEGATIVE Final   Influenza B by PCR NEGATIVE NEGATIVE Final    Comment: (NOTE) The Xpert Xpress SARS-CoV-2/FLU/RSV plus assay is intended as an aid in the diagnosis of influenza from Nasopharyngeal swab specimens and should not be used as a sole basis for treatment. Nasal washings and aspirates are unacceptable for Xpert Xpress SARS-CoV-2/FLU/RSV testing.  Fact Sheet for Patients: BloggerCourse.com  Fact Sheet for Healthcare Providers: SeriousBroker.it  This test is not yet approved or cleared by the [place] FDA and has been authorized for detection and/or diagnosis of SARS-CoV-2 by FDA under an Emergency Use Authorization (EUA). This EUA will remain in effect (meaning this test can be used) for the duration of the COVID-19 declaration under Section 564(b)(1) of the Act, 21 U.S.C. section 360bbb-3(b)(1), unless the authorization is terminated or revoked.  Performed at [facility], [address], [place], [place] [address]          Radiology Studies: No results found.      Scheduled Meds:  folic acid  1 mg Oral Daily   furosemide  40 mg Intravenous Daily   metoprolol succinate  12.5 mg Oral Daily   multivitamin with minerals  1 tablet Oral Daily   pantoprazole (PROTONIX) IV  40 mg Intravenous Q12H   spironolactone  12.5 mg Oral Daily   thiamine  100 mg Oral Daily   Or   thiamine  100 mg Intravenous Daily   Continuous Infusions:  sodium chloride       LOS: 5 days    Time spent: 27 minutes    Marrion Coy, MD [facility]   To contact the attending provider between 7A-7P or the covering provider during after hours 7P-7A, please log into the web site [email or web address] and access using universal [place] password for that web site. If you do not have the password, please call the hospital operator.  08/05/2021, 10:47 AM

## 2021-08-05 NOTE — Progress Notes (Signed)
Midge Minium, MD [facility]   [address], [address] [place], [place] [address] Phone: [phone] Fax : [phone]   Subjective: Who is being seen by me for the first time for symptomatic anemia.  The patient has been told that he has anemia and responded that he is not sure if he believes it.  He also stated that all doctors lie.  He reports that Dr. Allegra Lai told him that she would do his procedure at 5:00 in the morning.  He states he does not want to be without eating for any significant amount of time.  He has a history of AVMs on his last upper endoscopy but has not had a colonoscopy since 2000.   Objective: Vital signs in last 24 hours: Vitals:   08/04/21 2316 08/05/21 0345 08/05/21 0850 08/05/21 1131  BP: 96/77 102/76 105/85 101/68  Pulse: 92 91 93 92  Resp: 18 11 17 18   Temp: 98.4 F (36.9 C) 98.1 F (36.7 C) 98.5 F (36.9 C) 98.6 F (37 C)  TempSrc: Oral Oral Oral Oral  SpO2: 99% 100% 100% 100%  Weight:      Height:       Weight change:   Intake/Output Summary (Last 24 hours) at 08/05/2021 1518 Last data filed at 08/05/2021 1414 Gross per 24 hour  Intake 476 ml  Output 2500 ml  Net -2024 ml     Exam: General: laying in bed in no apparent distress   Lab Results: @LABTEST2 @ Micro Results: Recent Results (from the past 240 hour(s))  Resp Panel by RT-PCR (Flu A&B, Covid) Nasopharyngeal Swab     Status: None   Collection Time: 07/31/21 10:28 PM   Specimen: Nasopharyngeal Swab; Nasopharyngeal(NP) swabs in vial transport medium  Result Value Ref Range Status   SARS Coronavirus 2 by RT PCR NEGATIVE NEGATIVE Final    Comment: (NOTE) SARS-CoV-2 target nucleic acids are NOT DETECTED.  The SARS-CoV-2 RNA is generally detectable in upper respiratory specimens during the acute phase of infection. The lowest concentration of SARS-CoV-2 viral copies this assay can detect is 138 copies/mL. A negative result does not preclude SARS-Cov-2 infection and should not be used  as the sole basis for treatment or other patient management decisions. A negative result may occur with  improper specimen collection/handling, submission of specimen other than nasopharyngeal swab, presence of viral mutation(s) within the areas targeted by this assay, and inadequate number of viral copies(<138 copies/mL). A negative result must be combined with clinical observations, patient history, and epidemiological information. The expected result is Negative.  Fact Sheet for Patients:   Fact Sheet for Healthcare Providers:  08/02/21  This test is no t yet approved or cleared by the [employer] FDA and  has been authorized for detection and/or diagnosis of SARS-CoV-2 by FDA under an Emergency Use Authorization (EUA). This EUA will remain  in effect (meaning this test can be used) for the duration of the COVID-19 declaration under Section 564(b)(1) of the Act, 21 U.S.C.section 360bbb-3(b)(1), unless the authorization is terminated  or revoked sooner.       Influenza A by PCR NEGATIVE NEGATIVE Final   Influenza B by PCR NEGATIVE NEGATIVE Final    Comment: (NOTE) The Xpert Xpress SARS-CoV-2/FLU/RSV plus assay is intended as an aid in the diagnosis of influenza from Nasopharyngeal swab specimens and should not be used as a sole basis for treatment. Nasal washings and aspirates are unacceptable for Xpert Xpress SARS-CoV-2/FLU/RSV testing.  Fact Sheet for Patients: [employer]ious[employer]ker[employer]it  Fact  Sheet for Healthcare Providers: SeriousBroker.it  This test is not yet approved or cleared by the [place] and has been authorized for detection and/or diagnosis of SARS-CoV-2 by FDA under an Emergency Use Authorization (EUA). This EUA will remain in effect (meaning this test can be used) for the duration of the COVID-19 declaration under Section 564(b)(1)  of the Act, 21 U.S.C. section 360bbb-3(b)(1), unless the authorization is terminated or revoked.  Performed at [facility], [address] [place], [place] [address]    Studies/Results: No results found. Medications: I have reviewed the patient's current medications. Scheduled Meds:  folic acid  1 mg Oral Daily   furosemide  40 mg Intravenous Daily   metoprolol succinate  12.5 mg Oral Daily   multivitamin with minerals  1 tablet Oral Daily   pantoprazole (PROTONIX) IV  40 mg Intravenous Q12H   spironolactone  12.5 mg Oral Daily   thiamine  100 mg Oral Daily   Or   thiamine  100 mg Intravenous Daily   Continuous Infusions:  sodium chloride     PRN Meds:.ondansetron **OR** ondansetron (ZOFRAN) IV, oxyCODONE-acetaminophen, simethicone   Assessment: Principal Problem:   Symptomatic anemia Active Problems:   Generalized weakness   Lactic acidosis   Chronic alcohol abuse   Hyponatremia   AKI (acute kidney injury) (HCC)   Troponin I above reference range   Acute on chronic combined systolic and diastolic CHF (congestive heart failure) (HCC)   Acute HFrEF (heart failure with reduced ejection fraction) (HCC)    Plan: This patient has a history of symptomatic anemia and hasn't had a colonoscopy since 2000 and is reported by cardiology to have a low ejection fraction with systolic and diastolic dysfunction. The patient appears to be at significant risk for any procedures and I am trying to get some clarification from cardiology.  The patient had AVMs on his upper endoscopy 8 months ago.  The patient has been explained the plan including risks and benefits and he would like to proceed with the colonoscopy and upper endoscopy.   LOS: 5 days   Sherlyn Hay 08/05/2021, 3:18 PM Pager [phone] 7am-5pm  Check AMION for 5pm -7am coverage and on weekends

## 2021-08-05 NOTE — TOC Progression Note (Signed)
Transition of Care [facility]) - Progression Note    Patient Details  Name: John Escobar MRN: [id number] Date of Birth: [birth year]  Transition of Care [facility]) CM/SW Contact  Allayne Butcher, RN Phone Number: 08/05/2021, 2:42 PM  Clinical Narrative:    TOC following for assistance with discharge planning.  Referral to [facility].  Patient's kidney function is worsening and still needs EGD.     Expected Discharge Plan: Home/Self Care Barriers to Discharge: Continued Medical Work up  Expected Discharge Plan and Services Expected Discharge Plan: Home/Self Care   Discharge Planning Services: CM Consult   Living arrangements for the past 2 months: Apartment                 DME Arranged: N/A DME Agency: NA       HH Arranged: NA HH Agency: NA         Social Determinants of Health (SDOH) Interventions    Readmission Risk Interventions No flowsheet data found.

## 2021-08-06 ENCOUNTER — Telehealth: Payer: Self-pay | Admitting: *Deleted

## 2021-08-06 DIAGNOSIS — I5043 Acute on chronic combined systolic (congestive) and diastolic (congestive) heart failure: Secondary | ICD-10-CM

## 2021-08-06 DIAGNOSIS — D649 Anemia, unspecified: Secondary | ICD-10-CM | POA: Diagnosis not present

## 2021-08-06 DIAGNOSIS — I5021 Acute systolic (congestive) heart failure: Secondary | ICD-10-CM | POA: Diagnosis not present

## 2021-08-06 DIAGNOSIS — N179 Acute kidney failure, unspecified: Secondary | ICD-10-CM | POA: Diagnosis not present

## 2021-08-06 LAB — CBC WITH DIFFERENTIAL/PLATELET
Abs Immature Granulocytes: 0.07 10*3/uL (ref 0.00–0.07)
Basophils Absolute: 0 10*3/uL (ref 0.0–0.1)
Basophils Relative: 0 %
Eosinophils Absolute: 0 10*3/uL (ref 0.0–0.5)
Eosinophils Relative: 0 %
HCT: 33.9 % — ABNORMAL LOW (ref 39.0–52.0)
Hemoglobin: 10.1 g/dL — ABNORMAL LOW (ref 13.0–17.0)
Immature Granulocytes: 1 %
Lymphocytes Relative: 7 %
Lymphs Abs: 0.6 10*3/uL — ABNORMAL LOW (ref 0.7–4.0)
MCH: 24.9 pg — ABNORMAL LOW (ref 26.0–34.0)
MCHC: 29.8 g/dL — ABNORMAL LOW (ref 30.0–36.0)
MCV: 83.5 fL (ref 80.0–100.0)
Monocytes Absolute: 1 10*3/uL (ref 0.1–1.0)
Monocytes Relative: 11 %
Neutro Abs: 7.8 10*3/uL — ABNORMAL HIGH (ref 1.7–7.7)
Neutrophils Relative %: 81 %
Platelets: 172 10*3/uL (ref 150–400)
RBC: 4.06 MIL/uL — ABNORMAL LOW (ref 4.22–5.81)
RDW: 24.9 % — ABNORMAL HIGH (ref 11.5–15.5)
WBC: 9.6 10*3/uL (ref 4.0–10.5)
nRBC: 0 % (ref 0.0–0.2)

## 2021-08-06 LAB — BASIC METABOLIC PANEL
Anion gap: 11 (ref 5–15)
BUN: 38 mg/dL — ABNORMAL HIGH (ref 8–23)
CO2: 25 mmol/L (ref 22–32)
Calcium: 9 mg/dL (ref 8.9–10.3)
Chloride: 98 mmol/L (ref 98–111)
Creatinine, Ser: 1.69 mg/dL — ABNORMAL HIGH (ref 0.61–1.24)
GFR, Estimated: 44 mL/min — ABNORMAL LOW (ref >60)
Glucose, Bld: 112 mg/dL — ABNORMAL HIGH (ref 70–99)
Potassium: 4.1 mmol/L (ref 3.5–5.1)
Sodium: 134 mmol/L — ABNORMAL LOW (ref 135–145)

## 2021-08-06 LAB — MAGNESIUM: Magnesium: 2.5 mg/dL — ABNORMAL HIGH (ref 1.7–2.4)

## 2021-08-06 SURGERY — COLONOSCOPY WITH PROPOFOL
Anesthesia: General

## 2021-08-06 MED ORDER — PANTOPRAZOLE SODIUM 40 MG PO TBEC: 40.0000 mg | DELAYED_RELEASE_TABLET | Freq: Two times a day (BID) | ORAL | 0 refills | Status: DC

## 2021-08-06 MED ORDER — SIMETHICONE 80 MG PO CHEW: 80.0000 mg | CHEWABLE_TABLET | Freq: Four times a day (QID) | ORAL | 0 refills | Status: DC | PRN

## 2021-08-06 MED ORDER — FUROSEMIDE 40 MG PO TABS: 40.0000 mg | ORAL_TABLET | Freq: Every day | ORAL | 0 refills | Status: DC

## 2021-08-06 MED ORDER — LISINOPRIL 5 MG PO TABS
2.5000 mg | ORAL_TABLET | Freq: Every day | ORAL | Status: DC
  Administered 2021-08-06: 2.5 mg via ORAL
  Filled 2021-08-06: qty 1

## 2021-08-06 MED ORDER — LISINOPRIL 2.5 MG PO TABS: 2.5000 mg | ORAL_TABLET | Freq: Every day | ORAL | 0 refills | Status: DC

## 2021-08-06 MED ORDER — POLYSACCHARIDE IRON COMPLEX 150 MG PO CAPS: 150.0000 mg | ORAL_CAPSULE | Freq: Every day | ORAL | 0 refills | Status: DC

## 2021-08-06 MED ORDER — FUROSEMIDE 40 MG PO TABS
40.0000 mg | ORAL_TABLET | Freq: Every day | ORAL | Status: DC
  Administered 2021-08-06: 40 mg via ORAL
  Filled 2021-08-06: qty 1

## 2021-08-06 MED ORDER — METOPROLOL SUCCINATE ER 25 MG PO TB24
12.5000 mg | ORAL_TABLET | Freq: Every day | ORAL | 0 refills | Status: DC
Start: 2021-08-06 — End: 2022-07-13

## 2021-08-06 NOTE — Telephone Encounter (Signed)
-----   Message from Yvonne Kendall, MD sent at 08/06/2021  9:00 AM EDT ----- Regarding: Hospital follow-up Good morning,  Could you help arrange for a hospital follow-up appointment with Dr. Mariah Milling or an APP in 1-2 weeks?  Patient will also need a basic metabolic panel in 1 week.  He will likely be discharged later today from [facility].  Thanks.  Thayer Ohm

## 2021-08-06 NOTE — Care Management Important Message (Signed)
Important Message  Patient Details  Name: John Escobar MRN: [id number] Date of Birth: [birth year]   Medicare Important Message Given:  Yes     Olegario Messier A Lacretia Tindall 08/06/2021, 10:30 AM

## 2021-08-06 NOTE — TOC Transition Note (Signed)
Transition of Care [facility]) - CM/SW Discharge Note   Patient Details  Name: John Escobar MRN: [id number] Date of Birth: [birth year]  Transition of Care [facility]) CM/SW Contact:  Allayne Butcher, RN Phone Number: 08/06/2021, 11:57 AM   Clinical Narrative:    Patient medically cleared for discharge home today.  [facility] is aware of discharge today and will be reaching out to patient to establish primary care services.  Patient does need transportation home.  [facility] will be arranged to take patient home.   Final next level of care: Home/Self Care Barriers to Discharge: Barriers Resolved   Patient Goals and CMS Choice        Discharge Placement                       Discharge Plan and Services   Discharge Planning Services: CM Consult            DME Arranged: N/A DME Agency: NA       HH Arranged: NA HH Agency: NA        Social Determinants of Health (SDOH) Interventions     Readmission Risk Interventions No flowsheet data found.

## 2021-08-06 NOTE — Telephone Encounter (Signed)
Pt currently admitted at this time.  Orders have been placed for BMET to be scheduled in 1 week in our office.  Pt will need hospital follow up with Dr. Mariah Milling or APP scheduled in 1-2 weeks.  Forwarding to scheduling as well.

## 2021-08-06 NOTE — Discharge Summary (Signed)
And wasPhysician Discharge Summary  Patient ID: John Escobar MRN: [id number] DOB/AGE: 65-03-1956 65 y.o.  Admit date: 07/31/2021 Discharge date: 08/06/2021  Admission Diagnoses:  Discharge Diagnoses:  Principal Problem:   Symptomatic anemia Active Problems:   Generalized weakness   Lactic acidosis   Chronic alcohol abuse   Hyponatremia   AKI (acute kidney injury) (HCC)   Troponin I above reference range   Acute on chronic combined systolic and diastolic CHF (congestive heart failure) (HCC)   Acute HFrEF (heart failure with reduced ejection fraction) [facility])   Discharged Condition: good  Hospital Course:  [name] is a 65 y.o. male with medical history significant of alcohol abuse, history of GI bleed with multiple ulcers back in February, COVID-19 infection in February, chronic anemia who presented to the ER with progressive weakness over the last couple of weeks.  Patient has also noted worsening lower extremity edema and some exertional dyspnea. Upon arriving the hospital, hemoglobin was 5.0.  Received 2 units PRBC. Also has acute on chronic systolic congestive heart failure, received a day of IV Lasix.  Echocardiogram showed ejection fraction 20 to 25%.    Symptomatic iron deficient anemia. Possible upper GI bleed secondary to AVM. Patient hemoglobin has been stable after giving 3 units PRBC and IV iron. Patient is seen by GI in the hospital, offered EGD/colonoscopy.  Patient refused to take bowel prep.  I spoke with Dr. Servando Snare, since patient has no current bleeding, he prefer colonoscopy/EGD is performed as outpatient in 1 time, do not want to sedate him twice. Will continue iron supplement.  Follow-up with GI as outpatient.   Acute on chronic combined systolic and diastolic congestive heart failure. 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 Elevated troponin secondary to congestive heart failure. Patient has severe LV systolic dysfunction.  Heart cath  was offered to the patient, patient refused it.  Currently, volume status is better, patient will continue beta-blocker, lisinopril and Lasix as outpatient.  Patient be followed with cardiology in the near future.   Acute on chronic kidney disease stage IIIa. Hyponatremia. Hypokalemia. Renal function is much improved.  Mild hyponatremia, potassium normalized.     Alcohol abuse. Alcoholic hepatitis. Conditions are stable.    Consults: cardiology and GI  Significant Diagnostic Studies:  Left ventricular ejection fraction, by estimation, is 20 to 25%. The left ventricle has severely decreased function. The left ventricle has no regional wall motion abnormalities. The left ventricular internal cavity size was moderately dilated. Left ventricular diastolic parameters are consistent with Grade II diastolic dysfunction (pseudonormalization). 1. Right ventricular systolic function is low normal. The right ventricular size is mildly enlarged. There is normal pulmonary artery systolic pressure. 2. 3. Left atrial size was mildly dilated. 4. The mitral valve is normal in structure. Moderate mitral valve regurgitation. 5. Tricuspid valve regurgitation is moderate.    Treatments: PPI, IV iron, PRBC, iv lasix  Discharge Exam: Blood pressure 112/80, pulse 91, temperature 97.9 F (36.6 C), resp. rate 14, height 5\' 9"  (1.753 m), weight 66.7 kg, SpO2 100 %. General appearance: alert and cooperative Resp: clear to auscultation bilaterally Cardio: regular rate and rhythm, S1, S2 normal, no murmur, click, rub or gallop GI: soft, non-tender; bowel sounds normal; no masses,  no organomegaly Extremities: extremities normal, atraumatic, no cyanosis or edema  Disposition: Discharge disposition: 01-Home or Self Care       Discharge Instructions     Diet - low sodium heart healthy   Complete by: As directed  Increase activity slowly   Complete by: As directed       Allergies as of  08/06/2021   No Known Allergies      Medication List     STOP taking these medications    albuterol 108 (90 Base) MCG/ACT inhaler Commonly known as: VENTOLIN HFA   ferrous sulfate 325 (65 FE) MG tablet   folic acid 1 MG tablet Commonly known as: FOLVITE   multivitamin with minerals Tabs tablet   senna-docusate 8.6-50 MG tablet Commonly known as: Senokot-S   thiamine 100 MG tablet       TAKE these medications    bismuth subsalicylate 262 MG/15ML suspension Commonly known as: PEPTO BISMOL Take 30 mLs by mouth every 6 (six) hours as needed.   furosemide 40 MG tablet Commonly known as: LASIX Take 1 tablet (40 mg total) by mouth daily.   GOODY HEADACHE PO Take 1 Package by mouth daily as needed.   iron polysaccharides 150 MG capsule Commonly known as: NIFEREX Take 1 capsule (150 mg total) by mouth daily.   lisinopril 2.5 MG tablet Commonly known as: ZESTRIL Take 1 tablet (2.5 mg total) by mouth daily.   metoprolol succinate 25 MG 24 hr tablet Commonly known as: TOPROL-XL Take 0.5 tablets (12.5 mg total) by mouth daily.   pantoprazole 40 MG tablet Commonly known as: PROTONIX Take 1 tablet (40 mg total) by mouth 2 (two) times daily before a meal.   simethicone 80 MG chewable tablet Commonly known as: MYLICON Chew 1 tablet (80 mg total) by mouth every 6 (six) hours as needed for flatulence.        Follow-up Information     Gollan, Tollie Pizza, MD .   Specialty: Cardiology Contact information: [address] [place] [address] [phone]         [facility] Schedule an appointment as soon as possible for a visit in 1 week(s).   Why: Primary Care services that come to you. Contact information: [address] [place] [address] [phone]         John Reil, MD Follow up in 2 week(s).   Specialty: Gastroenterology Contact information: [address] [place]  [address] [phone]                34 minutes Signed: Marrion Coy 08/06/2021, 9:37 AM

## 2021-08-06 NOTE — Telephone Encounter (Signed)
Scheduled with 1C

## 2021-08-06 NOTE — Progress Notes (Signed)
Progress Note  Patient Name: John Escobar Date of Encounter: 08/06/2021  [facility] Cardiologist: Julien Nordmann, MD   Subjective   Complains of chest pain only when drinking "city water".  He still gets short of breath walking to the bathroom.  He was scheduled for upper and lower endoscopy today, though these have been canceled as John Escobar refused bowel prep.  Inpatient Medications    Scheduled Meds:  folic acid  1 mg Oral Daily   furosemide  40 mg Oral Daily   lisinopril  2.5 mg Oral Daily   metoprolol succinate  12.5 mg Oral Daily   multivitamin with minerals  1 tablet Oral Daily   pantoprazole (PROTONIX) IV  40 mg Intravenous Q12H   thiamine  100 mg Oral Daily   Or   thiamine  100 mg Intravenous Daily   Continuous Infusions:  sodium chloride     PRN Meds: ondansetron **OR** ondansetron (ZOFRAN) IV, oxyCODONE-acetaminophen, simethicone   Vital Signs    Vitals:   08/05/21 2000 08/05/21 2118 08/06/21 0447 08/06/21 0754  BP:  104/76 117/83 112/80  Pulse:  95 92 91  Resp: 15 18 18 14   Temp:  98.3 F (36.8 C) 98 F (36.7 C) 97.9 F (36.6 C)  TempSrc:  Oral Oral   SpO2:  100% 98% 100%  Weight:      Height:        Intake/Output Summary (Last 24 hours) at 08/06/2021 0851 Last data filed at 08/06/2021 0500 Gross per 24 hour  Intake 0 ml  Output 1825 ml  Net -1825 ml   Last 3 Weights 08/01/2021 07/31/2021 11/30/2020  Weight (lbs) 147 lb 0.8 oz 140 lb 142 lb 10.2 oz  Weight (kg) 66.7 kg 63.504 kg 64.7 kg      Telemetry    Sinus rhythm with PVCs - Personally Reviewed  ECG    No new tracing  Physical Exam   GEN: No acute distress.   Neck: No JVD Cardiac: RRR, no murmurs, rubs, or gallops.  Respiratory: Poor inspiratory effort with diminished breath sounds throughout. GI: Soft with mild diffuse tenderness that patient attributes to a distended bladder MS: No edema; No deformity. Neuro:  Nonfocal  Psych: Flat affect, argumentative at  times  Labs    High Sensitivity Troponin:   Recent Labs  Lab 07/31/21 1911 08/01/21 0239  TROPONINIHS 152* 118*     Chemistry Recent Labs  Lab 07/31/21 1911 08/01/21 0832 08/02/21 0210 08/04/21 0425 08/05/21 0420 08/06/21 0549  NA 133* 131*   < > 132* 131* 134*  K 4.6 4.4   < > 3.8 3.7 4.1  CL 105 101   < > 98 95* 98  CO2 17* 16*   < > 25 26 25   GLUCOSE 109* 101*   < > 121* 107* 112*  BUN 36* 35*   < > 33* 34* 38*  CREATININE 1.92* 1.84*   < > 1.87* 2.14* 1.69*  CALCIUM 8.7* 8.7*   < > 8.3* 8.5* 9.0  MG  --   --    < > 1.9 1.6* 2.5*  PROT 7.5 6.7  --   --   --   --   ALBUMIN 3.6 3.2*  --   --   --   --   AST 130* 117*  --   --   --   --   ALT 123* 123*  --   --   --   --   [facility]  104 95  --   --   --   --   BILITOT 1.4* 2.7*  --   --   --   --   GFRNONAA 38* 40*   < > 39* 34* 44*  ANIONGAP 11 14   < > 9 10 11    < > = values in this interval not displayed.    Lipids No results for input(s): CHOL, TRIG, HDL, LABVLDL, LDLCALC, CHOLHDL in the last 168 hours.  Hematology Recent Labs  Lab 08/04/21 0425 08/05/21 0627 08/06/21 0549  WBC 9.1 10.6* 9.6  RBC 4.03* 3.97* 4.06*  HGB 9.7* 10.0* 10.1*  HCT 32.9* 32.7* 33.9*  MCV 81.6 82.4 83.5  MCH 24.1* 25.2* 24.9*  MCHC 29.5* 30.6 29.8*  RDW 22.8* 24.0* 24.9*  PLT 166 182 172   Thyroid No results for input(s): TSH, FREET4 in the last 168 hours.  BNP Recent Labs  Lab 08/01/21 0832  BNP >4,500.0*    DDimer No results for input(s): DDIMER in the last 168 hours.   Radiology    No results found.  Cardiac Studies   TTE (08/01/2021):  1. Left ventricular ejection fraction, by estimation, is 20 to 25%. The  left ventricle has severely decreased function. The left ventricle has no  regional wall motion abnormalities. The left ventricular internal cavity  size was moderately dilated. Left  ventricular diastolic parameters are consistent with Grade II diastolic  dysfunction (pseudonormalization).   2. Right  ventricular systolic function is low normal. The right  ventricular size is mildly enlarged. There is normal pulmonary artery  systolic pressure.   3. Left atrial size was mildly dilated.   4. The mitral valve is normal in structure. Moderate mitral valve  regurgitation.   5. Tricuspid valve regurgitation is moderate.   Patient Profile     65 y.o. male with history of GI bleed with AVMs complicated by recurrent severe iron deficiency anemia, COVID-19 infection (11/2020) and alcohol abuse, admitted with fatigue, severe anemia (hemoglobin 5) and new cardiomyopathy with LVEF 20-25%.  Assessment & Plan    Acute HFrEF: Etiology remains unclear and could be ischemic or alcoholic.  John Escobar appears fairly euvolemic on exam today.  He continues to have dyspnea with mild activity, consistent with NYHA class III heart failure.  However, this is confounded by his anemia (hemoglobin improved from admission).  Ultimately, John Escobar will need to be considered for right and left heart catheterization, though I favor deferring this until his anemia has stabilized and work-up completed.  His contrary and and at times belligerent demeanor also makes a complete discussion of the procedure as well as its risks and benefits difficult.  We will therefore work on optimizing his heart failure therapies and arrange for outpatient follow-up. -Continue metoprolol succinate 12.5 mg daily. -Discontinue spironolactone, as I worry about the patient's ability to follow-up for routine labs and potential risk for associated hyperkalemia with this medication. -Start lisinopril 2.5 mg daily.  Patient will need follow-up labs in about a week to ensure stable renal function and potassium. -Switch from furosemide 40 mg IV daily to 40 mg p.o. daily.  Severe iron deficiency anemia: Likely due to acute on chronic GI bleeding.  Patient was scheduled for upper and lower endoscopy today, though this has been canceled due to the  patient's refusal to undergo bowel prep. -Continue work-up and management per internal medicine and GI.  Acute kidney injury superimposed on chronic kidney disease: Creatinine improving.  Suspect anemia and  diuresis in the setting of CKD and severe LV dysfunction drove acute exacerbation. -De-escalate diuresis to furosemide 40 mg p.o. daily. -Challenge with low-dose ACE inhibitor, which will hopefully help long-term cardiac function and to lower the risk for recurrent cardiorenal syndrome.  For questions or updates, please contact [facility] Please consult [email or web address] for contact info under [facility].     Signed, Yvonne Kendall, MD  08/06/2021, 8:51 AM

## 2021-08-07 NOTE — Telephone Encounter (Signed)
Transition Care Management Unsuccessful Follow-up Telephone Call  Date of discharge and from where:  [facility] 08/06/21  Attempts:  1st Attempt  Reason for unsuccessful TCM follow-up call:  Left voice message

## 2021-08-08 NOTE — Telephone Encounter (Signed)
Transition Care Management Unsuccessful Follow-up Telephone Call  Date of discharge and from where:  [facility] 08/06/21  Attempts:  2nd Attempt  Reason for unsuccessful TCM follow-up call:  Missing or invalid number- I was able to speak with someone at the # on file for the patient, but she advised this was a new # for her and people keep calling asking for John Escobar. I advised my apologies. Will need to get an updated # on file for the patient when he comes for his appointments.   No DPR on file to speak with anyone else.   Closing this encounter.

## 2021-08-13 ENCOUNTER — Other Ambulatory Visit: Payer: Medicare HMO

## 2021-08-20 ENCOUNTER — Ambulatory Visit: Payer: Medicare HMO | Admitting: Nurse Practitioner

## 2021-08-20 ENCOUNTER — Encounter: Payer: Self-pay | Admitting: Nurse Practitioner

## 2021-08-20 NOTE — Progress Notes (Deleted)
Office Visit    Patient Name: John Escobar Date of Encounter: 08/20/2021  Primary Care Provider:  [facility] Primary Cardiologist:  Julien Nordmann, MD  Chief Complaint    65 year old male with a history of alcohol abuse, GI bleed, peptic ulcer disease, COVID-19 infection and sepsis in February 2022, and iron deficiency anemia, who was recently hospitalized with fatigue, profound anemia, elevated troponin, CHF, and finding of dilated cardiomyopathy, with an EF of 20 to 25%, who presents for heart failure follow-up.  Past Medical History    Past Medical History:  Diagnosis Date   CKD (chronic kidney disease), stage III (HCC)    COVID-19 virus infection w/ Sepsis 11/2020   Dilated cardiomyopathy (HCC)    a. 07/2021 Echo: EF 20-25%, GrII DD. Low-nl RV fxn. Nl RVSP.  Mildly dil LA. Mod MR/TR.   ETOH abuse    Gastritis    HFrEF (heart failure with reduced ejection fraction) (HCC)    a. 07/2021 Echo: EF 20-25%.   Hiatal hernia    Iron deficiency anemia    PUD (peptic ulcer disease)    Past Surgical History:  Procedure Laterality Date   ELBOW BURSA SURGERY     ESOPHAGOGASTRODUODENOSCOPY (EGD) WITH PROPOFOL N/A 11/29/2020   Procedure: ESOPHAGOGASTRODUODENOSCOPY (EGD) WITH PROPOFOL;  Surgeon: Toledo, Boykin Nearing, MD;  Location: [facility] ENDOSCOPY;  Service: Gastroenterology;  Laterality: N/A;   SHOULDER SURGERY Left     Allergies  No Known Allergies  History of Present Illness    65 year old male with above past medical history including alcohol abuse, GI bleed, peptic ulcer disease, COVID-19 infection and sepsis (February 2022), and iron deficiency anemia.  In February 2022, he was admitted with fall/dizziness and was found to have a hemoglobin of 5.6.  He was also COVID-positive and septic.  EGD showed multiple ectasia, hiatal hernia, nonbleeding gastric ulcer, gastritis, and duodenal erosion.  He was treated with PPI and physical therapy recommended  skilled nursing.  Patient refused and was discharged home.  Subsequently lost to follow-up and was not taking his prescribed oral iron.  He has chronic dyspnea and weakness and since August of this year, noted progressive worsening of dyspnea on exertion.  On October 19, he became very short of breath at home and his son called EMS.  In the emergency department he was found to be anemic with an H&H of 5.0/19.3.  BUN and creatinine were elevated at 36 and 1.92.  High-sensitivity troponin was elevated at 152  118.BNP was greater than 4500.  ECG shows sinus tachycardia with septal infarct and inferolateral ST and T abnormalities which were new.  Chest x-ray was without acute findings.  He was transfused and treated with IV Lasix.  An echocardiogram was performed and showed an EF of 20 to 25% with moderate MR and TR.  He was seen by GI with plan for upper and lower endoscopy however, patient refused bowel prep and he was treated conservatively with PPI, IV iron and transfusions.  He also refused any additional cardiac work-up.  Though he was initially diuresed, due to acute kidney injury, this was de-escalated and he was discharged home on October 25 on Toprol-XL 12.5 mg daily, lisinopril 2.5 mg daily, and Lasix 40 mg daily.  Home Medications    Current Outpatient Medications  Medication Sig Dispense Refill   Aspirin-Acetaminophen-Caffeine (GOODY HEADACHE PO) Take 1 Package by mouth daily as needed.     bismuth subsalicylate (PEPTO BISMOL) 262 MG/15ML suspension Take 30  mLs by mouth every 6 (six) hours as needed.     furosemide (LASIX) 40 MG tablet Take 1 tablet (40 mg total) by mouth daily. 30 tablet 0   iron polysaccharides (NIFEREX) 150 MG capsule Take 1 capsule (150 mg total) by mouth daily. 30 capsule 0   lisinopril (ZESTRIL) 2.5 MG tablet Take 1 tablet (2.5 mg total) by mouth daily. 30 tablet 0   metoprolol succinate (TOPROL-XL) 25 MG 24 hr tablet Take 0.5 tablets (12.5 mg total) by mouth daily. 60  tablet 0   pantoprazole (PROTONIX) 40 MG tablet Take 1 tablet (40 mg total) by mouth 2 (two) times daily before a meal. 60 tablet 0   simethicone (MYLICON) 80 MG chewable tablet Chew 1 tablet (80 mg total) by mouth every 6 (six) hours as needed for flatulence. 30 tablet 0   No current facility-administered medications for this visit.     Review of Systems    ***.  All other systems reviewed and are otherwise negative except as noted above.    Physical Exam    VS:  There were no vitals taken for this visit. , BMI There is no height or weight on file to calculate BMI.     GEN: Well nourished, well developed, in no acute distress. HEENT: normal. Neck: Supple, no JVD, carotid bruits, or masses. Cardiac: RRR, no murmurs, rubs, or gallops. No clubbing, cyanosis, edema.  Radials/DP/PT 2+ and equal bilaterally.  Respiratory:  Respirations regular and unlabored, clear to auscultation bilaterally. GI: Soft, nontender, nondistended, BS + x 4. MS: no deformity or atrophy. Skin: warm and dry, no rash. Neuro:  Strength and sensation are intact. Psych: Normal affect.  Accessory Clinical Findings    ECG personally reviewed by me today - *** - no acute changes.  Lab Results  Component Value Date   WBC 9.6 08/06/2021   HGB 10.1 (L) 08/06/2021   HCT 33.9 (L) 08/06/2021   MCV 83.5 08/06/2021   PLT 172 08/06/2021   Lab Results  Component Value Date   CREATININE 1.69 (H) 08/06/2021   BUN 38 (H) 08/06/2021   NA 134 (L) 08/06/2021   K 4.1 08/06/2021   CL 98 08/06/2021   CO2 25 08/06/2021   Lab Results  Component Value Date   ALT 123 (H) 08/01/2021   AST 117 (H) 08/01/2021   ALKPHOS 95 08/01/2021   BILITOT 2.7 (H) 08/01/2021   No results found for: CHOL, HDL, LDLCALC, LDLDIRECT, TRIG, CHOLHDL  No results found for: HGBA1C  Assessment & Plan    1.  ***   Murray Hodgkins, NP 08/20/2021, 7:56 AM

## 2021-08-21 ENCOUNTER — Encounter: Payer: Self-pay | Admitting: Nurse Practitioner

## 2022-03-20 ENCOUNTER — Emergency Department: Payer: Medicare HMO

## 2022-03-20 ENCOUNTER — Other Ambulatory Visit: Payer: Self-pay

## 2022-03-20 ENCOUNTER — Inpatient Hospital Stay
Admission: EM | Admit: 2022-03-20 | Discharge: 2022-03-28 | DRG: 378 | Discharge status: Against Medical Advice | Payer: Medicare HMO | Attending: Family Medicine | Admitting: Family Medicine

## 2022-03-20 DIAGNOSIS — N179 Acute kidney failure, unspecified: Secondary | ICD-10-CM | POA: Diagnosis present

## 2022-03-20 DIAGNOSIS — F22 Delusional disorders: Secondary | ICD-10-CM | POA: Diagnosis present

## 2022-03-20 DIAGNOSIS — F10231 Alcohol dependence with withdrawal delirium: Secondary | ICD-10-CM | POA: Diagnosis present

## 2022-03-20 DIAGNOSIS — N1832 Chronic kidney disease, stage 3b: Secondary | ICD-10-CM | POA: Diagnosis present

## 2022-03-20 DIAGNOSIS — D72829 Elevated white blood cell count, unspecified: Secondary | ICD-10-CM | POA: Diagnosis present

## 2022-03-20 DIAGNOSIS — D649 Anemia, unspecified: Secondary | ICD-10-CM

## 2022-03-20 DIAGNOSIS — K31819 Angiodysplasia of stomach and duodenum without bleeding: Secondary | ICD-10-CM | POA: Diagnosis present

## 2022-03-20 DIAGNOSIS — Z789 Other specified health status: Secondary | ICD-10-CM | POA: Diagnosis not present

## 2022-03-20 DIAGNOSIS — K254 Chronic or unspecified gastric ulcer with hemorrhage: Secondary | ICD-10-CM | POA: Diagnosis present

## 2022-03-20 DIAGNOSIS — K922 Gastrointestinal hemorrhage, unspecified: Secondary | ICD-10-CM | POA: Diagnosis present

## 2022-03-20 DIAGNOSIS — F10151 Alcohol abuse with alcohol-induced psychotic disorder with hallucinations: Secondary | ICD-10-CM | POA: Diagnosis not present

## 2022-03-20 DIAGNOSIS — I420 Dilated cardiomyopathy: Secondary | ICD-10-CM | POA: Diagnosis present

## 2022-03-20 DIAGNOSIS — Z7189 Other specified counseling: Secondary | ICD-10-CM | POA: Diagnosis not present

## 2022-03-20 DIAGNOSIS — Z8616 Personal history of COVID-19: Secondary | ICD-10-CM

## 2022-03-20 DIAGNOSIS — Z79899 Other long term (current) drug therapy: Secondary | ICD-10-CM | POA: Diagnosis not present

## 2022-03-20 DIAGNOSIS — M542 Cervicalgia: Secondary | ICD-10-CM | POA: Diagnosis present

## 2022-03-20 DIAGNOSIS — M1A9XX1 Chronic gout, unspecified, with tophus (tophi): Secondary | ICD-10-CM

## 2022-03-20 DIAGNOSIS — R627 Adult failure to thrive: Secondary | ICD-10-CM | POA: Diagnosis present

## 2022-03-20 DIAGNOSIS — E440 Moderate protein-calorie malnutrition: Secondary | ICD-10-CM | POA: Insufficient documentation

## 2022-03-20 DIAGNOSIS — I5042 Chronic combined systolic (congestive) and diastolic (congestive) heart failure: Secondary | ICD-10-CM | POA: Diagnosis present

## 2022-03-20 DIAGNOSIS — N189 Chronic kidney disease, unspecified: Secondary | ICD-10-CM

## 2022-03-20 DIAGNOSIS — F32A Depression, unspecified: Secondary | ICD-10-CM | POA: Diagnosis present

## 2022-03-20 DIAGNOSIS — F419 Anxiety disorder, unspecified: Secondary | ICD-10-CM | POA: Diagnosis present

## 2022-03-20 DIAGNOSIS — K2971 Gastritis, unspecified, with bleeding: Secondary | ICD-10-CM

## 2022-03-20 DIAGNOSIS — K760 Fatty (change of) liver, not elsewhere classified: Secondary | ICD-10-CM | POA: Diagnosis present

## 2022-03-20 DIAGNOSIS — F1010 Alcohol abuse, uncomplicated: Secondary | ICD-10-CM | POA: Diagnosis not present

## 2022-03-20 DIAGNOSIS — Z5329 Procedure and treatment not carried out because of patient's decision for other reasons: Secondary | ICD-10-CM | POA: Diagnosis present

## 2022-03-20 DIAGNOSIS — Z7982 Long term (current) use of aspirin: Secondary | ICD-10-CM

## 2022-03-20 DIAGNOSIS — Z6822 Body mass index (BMI) 22.0-22.9, adult: Secondary | ICD-10-CM | POA: Diagnosis not present

## 2022-03-20 DIAGNOSIS — F10930 Alcohol use, unspecified with withdrawal, uncomplicated: Secondary | ICD-10-CM | POA: Diagnosis not present

## 2022-03-20 DIAGNOSIS — R42 Dizziness and giddiness: Secondary | ICD-10-CM | POA: Diagnosis not present

## 2022-03-20 DIAGNOSIS — Z8711 Personal history of peptic ulcer disease: Secondary | ICD-10-CM | POA: Diagnosis not present

## 2022-03-20 DIAGNOSIS — G928 Other toxic encephalopathy: Secondary | ICD-10-CM | POA: Diagnosis present

## 2022-03-20 DIAGNOSIS — Z515 Encounter for palliative care: Secondary | ICD-10-CM

## 2022-03-20 DIAGNOSIS — R531 Weakness: Secondary | ICD-10-CM | POA: Diagnosis not present

## 2022-03-20 DIAGNOSIS — N183 Chronic kidney disease, stage 3 unspecified: Secondary | ICD-10-CM

## 2022-03-20 DIAGNOSIS — D62 Acute posthemorrhagic anemia: Secondary | ICD-10-CM

## 2022-03-20 DIAGNOSIS — N1831 Chronic kidney disease, stage 3a: Secondary | ICD-10-CM | POA: Diagnosis not present

## 2022-03-20 LAB — COMPREHENSIVE METABOLIC PANEL
ALT: 13 U/L (ref 0–44)
AST: 27 U/L (ref 15–41)
Albumin: 3.3 g/dL — ABNORMAL LOW (ref 3.5–5.0)
Alkaline Phosphatase: 46 U/L (ref 38–126)
Anion gap: 7 (ref 5–15)
BUN: 73 mg/dL — ABNORMAL HIGH (ref 8–23)
CO2: 21 mmol/L — ABNORMAL LOW (ref 22–32)
Calcium: 8.6 mg/dL — ABNORMAL LOW (ref 8.9–10.3)
Chloride: 109 mmol/L (ref 98–111)
Creatinine, Ser: 1.88 mg/dL — ABNORMAL HIGH (ref 0.61–1.24)
GFR, Estimated: 39 mL/min — ABNORMAL LOW (ref >60)
Glucose, Bld: 192 mg/dL — ABNORMAL HIGH (ref 70–99)
Potassium: 4.2 mmol/L (ref 3.5–5.1)
Sodium: 137 mmol/L (ref 135–145)
Total Bilirubin: 0.5 mg/dL (ref 0.3–1.2)
Total Protein: 6.8 g/dL (ref 6.5–8.1)

## 2022-03-20 LAB — CBC WITH DIFFERENTIAL/PLATELET
Abs Immature Granulocytes: 0.35 10*3/uL — ABNORMAL HIGH (ref 0.00–0.07)
Basophils Absolute: 0 10*3/uL (ref 0.0–0.1)
Basophils Relative: 0 %
Eosinophils Absolute: 0 10*3/uL (ref 0.0–0.5)
Eosinophils Relative: 0 %
HCT: 17.7 % — ABNORMAL LOW (ref 39.0–52.0)
Hemoglobin: 5 g/dL — ABNORMAL LOW (ref 13.0–17.0)
Immature Granulocytes: 2 %
Lymphocytes Relative: 8 %
Lymphs Abs: 1.4 10*3/uL (ref 0.7–4.0)
MCH: 27.2 pg (ref 26.0–34.0)
MCHC: 28.2 g/dL — ABNORMAL LOW (ref 30.0–36.0)
MCV: 96.2 fL (ref 80.0–100.0)
Monocytes Absolute: 1.1 10*3/uL — ABNORMAL HIGH (ref 0.1–1.0)
Monocytes Relative: 6 %
Neutro Abs: 15.5 10*3/uL — ABNORMAL HIGH (ref 1.7–7.7)
Neutrophils Relative %: 84 %
Platelets: 344 10*3/uL (ref 150–400)
RBC: 1.84 MIL/uL — ABNORMAL LOW (ref 4.22–5.81)
RDW: 16.6 % — ABNORMAL HIGH (ref 11.5–15.5)
WBC: 18.4 10*3/uL — ABNORMAL HIGH (ref 4.0–10.5)
nRBC: 0 % (ref 0.0–0.2)

## 2022-03-20 LAB — PREPARE RBC (CROSSMATCH)

## 2022-03-20 LAB — GLUCOSE, CAPILLARY: Glucose-Capillary: 112 mg/dL — ABNORMAL HIGH (ref 70–99)

## 2022-03-20 LAB — LIPASE, BLOOD: Lipase: 42 U/L (ref 11–51)

## 2022-03-20 MED ORDER — DOCUSATE SODIUM 100 MG PO CAPS: 100.0000 mg | ORAL_CAPSULE | Freq: Two times a day (BID) | ORAL | Status: DC | PRN

## 2022-03-20 MED ORDER — PANTOPRAZOLE 80MG IVPB - SIMPLE MED
80.0000 mg | Freq: Once | INTRAVENOUS | Status: AC
  Administered 2022-03-20: 80 mg via INTRAVENOUS
  Filled 2022-03-20: qty 100

## 2022-03-20 MED ORDER — ONDANSETRON 4 MG PO TBDP: 4.0000 mg | ORAL_TABLET | Freq: Once | ORAL | Status: DC

## 2022-03-20 MED ORDER — POLYETHYLENE GLYCOL 3350 17 G PO PACK: 17.0000 g | PACK | Freq: Every day | ORAL | Status: DC | PRN

## 2022-03-20 MED ORDER — SODIUM CHLORIDE 0.9 % IV SOLN: 10.0000 mL/h | Freq: Once | INTRAVENOUS | Status: DC

## 2022-03-20 MED ORDER — PANTOPRAZOLE SODIUM 40 MG IV SOLR: 40.0000 mg | Freq: Two times a day (BID) | INTRAVENOUS | Status: DC

## 2022-03-20 MED ORDER — LORAZEPAM 2 MG/ML IJ SOLN
0.0000 mg | Freq: Four times a day (QID) | INTRAMUSCULAR | Status: DC
  Administered 2022-03-20 – 2022-03-22 (×3): 2 mg via INTRAVENOUS
  Filled 2022-03-20 (×5): qty 1

## 2022-03-20 MED ORDER — SODIUM CHLORIDE 0.9 % IV BOLUS
1000.0000 mL | Freq: Once | INTRAVENOUS | Status: AC
  Administered 2022-03-20: 1000 mL via INTRAVENOUS

## 2022-03-20 MED ORDER — LORAZEPAM 2 MG/ML IJ SOLN
1.0000 mg | Freq: Once | INTRAMUSCULAR | Status: AC
  Administered 2022-03-20: 1 mg via INTRAVENOUS
  Filled 2022-03-20: qty 1

## 2022-03-20 MED ORDER — LORAZEPAM 2 MG/ML IJ SOLN: 1.0000 mg | Freq: Once | INTRAMUSCULAR | Status: DC

## 2022-03-20 MED ORDER — THIAMINE HCL 100 MG/ML IJ SOLN
100.0000 mg | Freq: Every day | INTRAMUSCULAR | Status: DC
  Administered 2022-03-20 – 2022-03-24 (×3): 100 mg via INTRAVENOUS
  Filled 2022-03-20 (×4): qty 2

## 2022-03-20 MED ORDER — ONDANSETRON HCL 4 MG/2ML IJ SOLN
4.0000 mg | Freq: Four times a day (QID) | INTRAMUSCULAR | Status: DC | PRN
  Administered 2022-03-21 – 2022-03-23 (×3): 4 mg via INTRAVENOUS
  Filled 2022-03-20 (×3): qty 2

## 2022-03-20 MED ORDER — LORAZEPAM 2 MG PO TABS: 0.0000 mg | ORAL_TABLET | Freq: Two times a day (BID) | ORAL | Status: DC

## 2022-03-20 MED ORDER — LORAZEPAM 2 MG/ML IJ SOLN: 0.0000 mg | Freq: Two times a day (BID) | INTRAMUSCULAR | Status: DC

## 2022-03-20 MED ORDER — OCTREOTIDE LOAD VIA INFUSION
50.0000 ug | Freq: Once | INTRAVENOUS | Status: AC
  Administered 2022-03-20: 50 ug via INTRAVENOUS
  Filled 2022-03-20: qty 25

## 2022-03-20 MED ORDER — SODIUM CHLORIDE 0.9 % IV SOLN
1.0000 mg | Freq: Every day | INTRAVENOUS | Status: DC
  Administered 2022-03-21 (×2): 1 mg via INTRAVENOUS
  Filled 2022-03-20 (×2): qty 0.2

## 2022-03-20 MED ORDER — THIAMINE HCL 100 MG PO TABS: 100.0000 mg | ORAL_TABLET | Freq: Every day | ORAL | Status: DC

## 2022-03-20 MED ORDER — PANTOPRAZOLE INFUSION (NEW) - SIMPLE MED
8.0000 mg/h | INTRAVENOUS | Status: DC
  Administered 2022-03-20 – 2022-03-21 (×2): 8 mg/h via INTRAVENOUS
  Filled 2022-03-20 (×2): qty 100

## 2022-03-20 MED ORDER — ONDANSETRON HCL 4 MG/2ML IJ SOLN
4.0000 mg | Freq: Once | INTRAMUSCULAR | Status: AC
  Administered 2022-03-20: 4 mg via INTRAVENOUS
  Filled 2022-03-20: qty 2

## 2022-03-20 MED ORDER — LORAZEPAM 2 MG PO TABS: 0.0000 mg | ORAL_TABLET | Freq: Four times a day (QID) | ORAL | Status: DC

## 2022-03-20 MED ORDER — HYDROMORPHONE HCL 1 MG/ML IJ SOLN
0.5000 mg | INTRAMUSCULAR | Status: DC | PRN
  Administered 2022-03-21 – 2022-03-24 (×9): 0.5 mg via INTRAVENOUS
  Filled 2022-03-20 (×3): qty 1
  Filled 2022-03-20 (×2): qty 0.5
  Filled 2022-03-20: qty 1
  Filled 2022-03-20 (×3): qty 0.5

## 2022-03-20 MED ORDER — MORPHINE SULFATE (PF) 4 MG/ML IV SOLN: 4.0000 mg | INTRAVENOUS | Status: DC | PRN

## 2022-03-20 MED ORDER — SODIUM CHLORIDE 0.9 % IV SOLN
1.0000 g | Freq: Once | INTRAVENOUS | Status: AC
  Administered 2022-03-20: 1 g via INTRAVENOUS
  Filled 2022-03-20: qty 10

## 2022-03-20 MED ORDER — SODIUM CHLORIDE 0.9 % IV SOLN
50.0000 ug/h | INTRAVENOUS | Status: DC
  Administered 2022-03-20 – 2022-03-21 (×2): 50 ug/h via INTRAVENOUS
  Filled 2022-03-20 (×4): qty 1

## 2022-03-20 NOTE — ED Provider Triage Note (Signed)
Emergency Medicine Provider Triage Evaluation Note  John Escobar, a 66 y.o. male  was evaluated in triage.  Pt complains of persistent nausea vomiting for the last 4 days.  Patient reports brown-tinged emesis.  He denies any blood thinner use.  He does report a daily aspirin intake.  He denies any diarrhea or constipation, and is unsure of any bloody stools.  He gives a history of an upper GI bleed from February 2022.  Review of Systems  Positive: NV Negative: Constipation, diarrhea  Physical Exam  Ht 5\' 9"  (1.753 m)   Wt 63.5 kg   BMI 20.67 kg/m  Gen:   Awake, no distress  actively wretching Resp:  Normal effort CTA MSK:   Moves extremities without difficulty  ABD:  Soft, nontender  Medical Decision Making  Medically screening exam initiated at 5:01 PM.  Appropriate orders placed.  was informed that the remainder of the evaluation will be completed by another provider, this initial triage assessment does not replace that evaluation, and the importance of remaining in the ED until their evaluation is complete.  Patient to the ED with 4 days of persistent nausea and vomiting with a history of GI bleed.  Patient reports brown-colored emesis.   Pilar Jarvis, PA-C 03/20/22 1706

## 2022-03-20 NOTE — ED Provider Notes (Signed)
[facility] Provider Note    None    (approximate)   History   Emesis   HPI  KASSIUS FETHEROLF is a 66 y.o. male with history of PUD as well as alcohol dependence presents to the ER for 3 days of dark brown chocolate colored emesis unable to keep anything down.  States he drinks several alcoholic beverages daily with his last drink being yesterday.  Having trouble keeping anything down at all.  States his stools have been darker in color.  He is not on any blood thinners.     Physical Exam   Triage Vital Signs: ED Triage Vitals  Enc Vitals Group     BP 03/20/22 1659 107/80     Pulse Rate 03/20/22 1659 (!) 111     Resp 03/20/22 1659 20     Temp 03/20/22 1659 98.9 F (37.2 C)     Temp Source 03/20/22 1659 Oral     SpO2 03/20/22 1735 92 %     Weight 03/20/22 1648 140 lb (63.5 kg)     Height 03/20/22 1648 5\' 9"  (1.753 m)     Head Circumference --      Peak Flow --      Pain Score 03/20/22 1648 3     Pain Loc --      Pain Edu? --      Excl. in [place]? --     Most recent vital signs: Vitals:   03/20/22 1659 03/20/22 1735  BP: 107/80   Pulse: (!) 111   Resp: 20   Temp: 98.9 F (37.2 C)   SpO2:  92%     Constitutional: Alert but pale and ill appearing Eyes: Conjunctivae are normal.  Head: Atraumatic. Nose: No congestion/rhinnorhea. Mouth/Throat: Mucous membranes are moist.   Neck: Painless ROM.  Cardiovascular:   Good peripheral circulation. No m/g/r Respiratory: Normal respiratory effort.  No retractions.  Gastrointestinal: Soft nontender in all four quadrants Musculoskeletal:  no deformity Neurologic:  MAE spontaneously. No gross focal neurologic deficits are appreciated.  Skin:  Skin is warm, dry and intact. No rash noted. Psychiatric: Mood and affect are normal. Speech and behavior are normal.    ED Results / Procedures / Treatments   Labs (all labs ordered are listed, but only abnormal results are displayed) Labs Reviewed   COMPREHENSIVE METABOLIC PANEL - Abnormal; Notable for the following components:      Result Value   CO2 21 (*)    Glucose, Bld 192 (*)    BUN 73 (*)    Creatinine, Ser 1.88 (*)    Calcium 8.6 (*)    Albumin 3.3 (*)    GFR, Estimated 39 (*)    All other components within normal limits  CBC WITH DIFFERENTIAL/PLATELET - Abnormal; Notable for the following components:   WBC 18.4 (*)    RBC 1.84 (*)    Hemoglobin 5.0 (*)    HCT 17.7 (*)    MCHC 28.2 (*)    RDW 16.6 (*)    Neutro Abs 15.5 (*)    Monocytes Absolute 1.1 (*)    Abs Immature Granulocytes 0.35 (*)    All other components within normal limits  LIPASE, BLOOD  URINALYSIS, ROUTINE W REFLEX MICROSCOPIC  PROTIME-INR  POC OCCULT BLOOD, ED  TYPE AND SCREEN  PREPARE RBC (CROSSMATCH)     EKG  ED ECG REPORT I, Merlyn Lot, the attending physician, personally viewed and interpreted this ECG.   Date: 03/20/2022  EKG  Time: 16:53  Rate: 120  Rhythm: sinus  Axis: normal  Intervals: normal  ST&T Change: nonspecific st abn, no stemi criteria    RADIOLOGY Please see ED Course for my review and interpretation.  I personally reviewed all radiographic images ordered to evaluate for the above acute complaints and reviewed radiology reports and findings.  These findings were personally discussed with the patient.  Please see medical record for radiology report.    PROCEDURES:  Critical Care performed: Yes, see critical care procedure note(s)  .Critical Care  Performed by: Merlyn Lot, MD Authorized by: Merlyn Lot, MD   Critical care provider statement:    Critical care time (minutes):  35   Critical care was necessary to treat or prevent imminent or life-threatening deterioration of the following conditions:  Circulatory failure   Critical care was time spent personally by me on the following activities:  Ordering and performing treatments and interventions, ordering and review of laboratory studies,  ordering and review of radiographic studies, pulse oximetry, re-evaluation of patient's condition, review of old charts, obtaining history from patient or surrogate, examination of patient, evaluation of patient's response to treatment, discussions with primary provider, discussions with consultants and development of treatment plan with patient or surrogate    MEDICATIONS ORDERED IN ED: Medications  morphine (PF) 4 MG/ML injection 4 mg (has no administration in time range)  0.9 %  sodium chloride infusion (has no administration in time range)  pantoprozole (PROTONIX) 80 mg /NS 100 mL infusion (has no administration in time range)  pantoprazole (PROTONIX) injection 40 mg (has no administration in time range)  LORazepam (ATIVAN) injection 0-4 mg (has no administration in time range)    Or  LORazepam (ATIVAN) tablet 0-4 mg (has no administration in time range)  LORazepam (ATIVAN) injection 0-4 mg (has no administration in time range)    Or  LORazepam (ATIVAN) tablet 0-4 mg (has no administration in time range)  thiamine tablet 100 mg ( Oral See Alternative 03/20/22 1805)    Or  thiamine (B-1) injection 100 mg (100 mg Intravenous Given 03/20/22 1805)  octreotide (SANDOSTATIN) 2 mcg/mL load via infusion 50 mcg (has no administration in time range)    And  octreotide (SANDOSTATIN) 500 mcg in sodium chloride 0.9 % 250 mL (2 mcg/mL) infusion (has no administration in time range)  cefTRIAXone (ROCEPHIN) 1 g in sodium chloride 0.9 % 100 mL IVPB (has no administration in time range)  sodium chloride 0.9 % bolus 1,000 mL (1,000 mLs Intravenous New Bag/Given 03/20/22 1738)  ondansetron (ZOFRAN) injection 4 mg (4 mg Intravenous Given 03/20/22 1736)  pantoprazole (PROTONIX) 80 mg /NS 100 mL IVPB (80 mg Intravenous New Bag/Given 03/20/22 1831)  LORazepam (ATIVAN) injection 1 mg (1 mg Intravenous Given 03/20/22 1806)     IMPRESSION / MDM / [place] / ED COURSE  I reviewed the triage vital signs and the  nursing notes.                              Differential diagnosis includes, but is not limited to, UGIB, LGIB, varices, PUD, withdrawal, electrolyte abnormality, IDA  Patient presented to the ER for evaluation of symptoms as described above very ill-appearing pale, protecting his airway.  This presenting complaint could reflect a potentially life-threatening illness therefore the patient will be placed on continuous pulse oximetry and telemetry for monitoring.  Laboratory evaluation will be sent to evaluate for the above complaints.  Clinical Course as of 03/20/22 1851  Thu Mar 20, 2022  1821 Patient with drop in hemoglobin to 5.  Will order PRBC transfusion. + leukocytosis. admits to drinking alcohol with last drink being yesterday.  Concerned that the patient is actively withdrawing we will give Ativan.  BUN is also significantly elevated concerning for upper GI bleed and on review of his previous endoscopies did have evidence of gastric ulcers.  No varices from endoscopy 1 year ago but given his alcohol use and concern for possible cirrhosis will put on Protonix and octreotide infusion.  Will consult GI. [PR]  [id number] Chest x-ray on my interpretation without evidence of pneumothorax or free air under the diaphragm [PR]  1848 Case discussed in consultation with GI who agrees with plan for octreotide, Protonix as well as IV Rocephin.  I will consult hospitalist for admission.  His vomiting has settled down after receiving Ativan.  Still protecting airway.  I do suspect he was having withdrawal symptoms.  We will continue observe on monitor in ED. [PR]    Clinical Course User Index [PR] Merlyn Lot, MD    Patient's presentation is most consistent with acute presentation with potential threat to life or bodily function.   FINAL CLINICAL IMPRESSION(S) / ED DIAGNOSES   Final diagnoses:  Acute GI bleeding  Symptomatic anemia  Alcohol withdrawal syndrome without complication ([place])      Rx / DC Orders   ED Discharge Orders     None        Note:  This document was prepared using Dragon voice recognition software and may include unintentional dictation errors.    Merlyn Lot, MD 03/20/22 [phone]

## 2022-03-20 NOTE — ED Triage Notes (Signed)
Pt with c/o emesis x 4 days. Pt states emesis was brown. Pt states he does not take any blood thinners. Pt states he does take aspirin regularly.

## 2022-03-20 NOTE — H&P (Addendum)
NAME:  John Escobar, MRN:  [id number], DOB:  1956-04-20, LOS: 0 ADMISSION DATE:  03/20/2022, CONSULTATION DATE:  03/20/22 REFERRING MD:  Dr. Quentin Cornwall, CHIEF COMPLAINT:  N/V   History of Present Illness:  66 year old man with chronic daily alcohol use, what appears to be severe tophaceous gouty arthritis presenting with 3 days of N/V and vomiting with dark blood and coffee ground material.  Hgb 5 in ER.  Lipase neg.  Developed possible w/d symptoms so PCCM consulted due to possible decompensation.  Agreed for admit to watch until EGD.  GI consulted in ER and rec'd octreotide/ppi gtt, NPO.  Patient denies melena, chest pain, SOB, w/d symptoms although he is pretty circumspect with questions regarding amount of alcohol use.  States he drinks 3 beers a day and has never had w/d symptoms. Denies current hallucinations.  Received ativan x 1 in ER so far.  Prior EGD in Feb 2022 showing gastritis, angioectasias and some small gastric ulcers, no varices.  Pertinent  Medical History  Gastritis/PUD Dilated cardiomyopathy with no f/u EtOH use CKD 3b Likely longstanding gout  Significant Hospital Events: Including procedures, antibiotic start and stop dates in addition to other pertinent events   6/8 admitted  Interim History / Subjective:  Admitted.  Objective   Blood pressure 130/85, pulse (!) 111, temperature 98.3 F (36.8 C), temperature source Oral, resp. rate 16, height 5\' 9"  (1.753 m), weight 63.5 kg, SpO2 100 %.        Intake/Output Summary (Last 24 hours) at 03/20/2022 2132 Last data filed at 03/20/2022 2055 Gross per 24 hour  Intake 1460 ml  Output --  Net 1460 ml   Filed Weights   03/20/22 1648  Weight: 63.5 kg    Examination: General: chronically unwell man in NAD HENT: MMM, trachea midline Lungs: Clear, no wheezing Cardiovascular: SEM, ext warm Abdomen: soft, nontender, +BS Extremities: muscle wasting, severe arthritic changes of elbows, feet, knees, hands with  tophaceous outgrowths Neuro: moves all 4 ext to command Skin: no rashes  Resolved Hospital Problem list   N/A  Assessment & Plan: UGIB- suspicion for alcoholic/ASA gastritis and recurrent PUD rather than esophageal varices. - f/u INR - 2 units pRBC, trend H/H, goal Hgb 7 - GI to see in AM - NPO - PPI/octreotide drip - Check RUQ [place], if no signs of cirrhosis consider DC octreotide.  If does has ascites and cirrhosis will need ceftriaxone (started in ER) continued  CKD 3b- Cr about at baseline, elevated BUN related to GIB   Moderate protein calorie malnutrition POA- dietician consult, folate, thiamine  Dilated cardiomyopathy- lost to f/u.  Likely will need OP LHC if/when bleeding issues are better controlled.  Etoh use, Question developing Dts- his N/V seemed to be driving his possible w/d symptoms.   - CIWA monitoring with PRN ativan, thiamine/folate - Not convinced he is going through withdrawals yet; if he does start needing ativan will probably do phenobarb taper - Substance abuse counseling at some point  Gouty arthritis, severe- would benefit from OP PCP establishing care and suppressive therapy  Best Practice (right click and "Reselect all SmartList Selections" daily)   Diet/type: NPO DVT prophylaxis: SCD GI prophylaxis: PPI Lines: N/A Foley:  N/A Code Status:  full code Last date of multidisciplinary goals of care discussion [Pending]  Labs   CBC: Recent Labs  Lab 03/20/22 1712  WBC 18.4*  NEUTROABS 15.5*  HGB 5.0*  HCT 17.7*  MCV 96.2  PLT 344  Basic Metabolic Panel: Recent Labs  Lab 03/20/22 1712  NA 137  K 4.2  CL 109  CO2 21*  GLUCOSE 192*  BUN 73*  CREATININE 1.88*  CALCIUM 8.6*   GFR: Estimated Creatinine Clearance: 35.2 mL/min (A) (by C-G formula based on SCr of 1.88 mg/dL (H)). Recent Labs  Lab 03/20/22 1712  WBC 18.4*    Liver Function Tests: Recent Labs  Lab 03/20/22 1712  AST 27  ALT 13  ALKPHOS 46  BILITOT 0.5  PROT  6.8  ALBUMIN 3.3*   Recent Labs  Lab 03/20/22 1712  LIPASE 42   No results for input(s): "AMMONIA" in the last 168 hours.  ABG    Component Value Date/Time   HCO3 16.6 (L) 08/01/2021 0832   ACIDBASEDEF 7.6 (H) 08/01/2021 0832   O2SAT 85.6 08/01/2021 0832     Coagulation Profile: No results for input(s): "INR", "PROTIME" in the last 168 hours.  Cardiac Enzymes: No results for input(s): "CKTOTAL", "CKMB", "CKMBINDEX", "TROPONINI" in the last 168 hours.  HbA1C: No results found for: "HGBA1C"  CBG: Recent Labs  Lab 03/20/22 2123  GLUCAP 112*    Review of Systems:    Positive Symptoms in bold:  Constitutional fevers, chills, weight loss, fatigue, anorexia, malaise  Eyes decreased vision, double vision, eye irritation  Ears, Nose, Mouth, Throat sore throat, trouble swallowing, sinus congestion  Cardiovascular chest pain, paroxysmal nocturnal dyspnea, lower ext edema, palpitations   Respiratory SOB, cough, DOE, hemoptysis, wheezing  Gastrointestinal nausea, vomiting, diarrhea  Genitourinary burning with urination, trouble urinating  Musculoskeletal joint aches, joint swelling, back pain  Integumentary  rashes, skin lesions  Neurological focal weakness, focal numbness, trouble speaking, headaches  Psychiatric depression, anxiety, confusion  Endocrine polyuria, polydipsia, cold intolerance, heat intolerance  Hematologic abnormal bruising, abnormal bleeding, unexplained nose bleeds  Allergic/Immunologic recurrent infections, hives, swollen lymph nodes     Past Medical History:  He,  has a past medical history of CKD (chronic kidney disease), stage III ([place]), COVID-19 virus infection w/ Sepsis (11/2020), Dilated cardiomyopathy ([place]), ETOH abuse, Gastritis, HFrEF (heart failure with reduced ejection fraction) ([place]), Hiatal hernia, Iron deficiency anemia, and PUD (peptic ulcer disease).   Surgical History:   Past Surgical History:  Procedure Laterality Date   ELBOW  BURSA SURGERY     ESOPHAGOGASTRODUODENOSCOPY (EGD) WITH PROPOFOL N/A 11/29/2020   Procedure: ESOPHAGOGASTRODUODENOSCOPY (EGD) WITH PROPOFOL;  Surgeon: Toledo, Benay Pike, MD;  Location: [facility] ENDOSCOPY;  Service: Gastroenterology;  Laterality: N/A;   SHOULDER SURGERY Left      Social History:   reports that he has never smoked. He has never used smokeless tobacco. He reports current alcohol use. He reports that he does not currently use drugs.   Family History:  His family history is not on file.   Allergies No Known Allergies   Home Medications  Prior to Admission medications   Medication Sig Start Date End Date Taking? Authorizing Provider  Aspirin-Acetaminophen-Caffeine (GOODY HEADACHE PO) Take 1 Package by mouth daily as needed.    [provider]  bismuth subsalicylate (PEPTO BISMOL) 262 MG/15ML suspension Take 30 mLs by mouth every 6 (six) hours as needed.    [provider]  furosemide (LASIX) 40 MG tablet Take 1 tablet (40 mg total) by mouth daily. Patient not taking: Reported on 03/20/2022 08/06/21   Sharen Hones, MD  iron polysaccharides (NIFEREX) 150 MG capsule Take 1 capsule (150 mg total) by mouth daily. Patient not taking: Reported on 03/20/2022 08/06/21   Sharen Hones,  MD  lisinopril (ZESTRIL) 2.5 MG tablet Take 1 tablet (2.5 mg total) by mouth daily. Patient not taking: Reported on 03/20/2022 08/06/21   Sharen Hones, MD  metoprolol succinate (TOPROL-XL) 25 MG 24 hr tablet Take 0.5 tablets (12.5 mg total) by mouth daily. Patient not taking: Reported on 03/20/2022 08/06/21   Sharen Hones, MD  pantoprazole (PROTONIX) 40 MG tablet Take 1 tablet (40 mg total) by mouth 2 (two) times daily before a meal. Patient not taking: Reported on 03/20/2022 08/06/21   Sharen Hones, MD  simethicone (MYLICON) 80 MG chewable tablet Chew 1 tablet (80 mg total) by mouth every 6 (six) hours as needed for flatulence. Patient not taking: Reported on 03/20/2022 08/06/21   Sharen Hones, MD      Critical care time: N/A

## 2022-03-20 NOTE — ED Notes (Signed)
Pt transported by RN with monitor to ICU.

## 2022-03-20 NOTE — Hospital Course (Addendum)
Admission request : GIB and few vomiting coffee ground. Alcoholic.  Gi has been consulted: Dr.Vanga.  Will see pt and decide on ICU VS stepdown as pt is GIB and etoh withdrawal.

## 2022-03-21 ENCOUNTER — Inpatient Hospital Stay: Payer: Medicare HMO

## 2022-03-21 DIAGNOSIS — K922 Gastrointestinal hemorrhage, unspecified: Secondary | ICD-10-CM | POA: Diagnosis not present

## 2022-03-21 DIAGNOSIS — F10930 Alcohol use, unspecified with withdrawal, uncomplicated: Secondary | ICD-10-CM

## 2022-03-21 LAB — HEPATIC FUNCTION PANEL
ALT: 13 U/L (ref 0–44)
AST: 41 U/L (ref 15–41)
Albumin: 2.9 g/dL — ABNORMAL LOW (ref 3.5–5.0)
Alkaline Phosphatase: 40 U/L (ref 38–126)
Bilirubin, Direct: <0.1 mg/dL (ref 0.0–0.2)
Total Bilirubin: 0.5 mg/dL (ref 0.3–1.2)
Total Protein: 6 g/dL — ABNORMAL LOW (ref 6.5–8.1)

## 2022-03-21 LAB — BASIC METABOLIC PANEL
Anion gap: 5 (ref 5–15)
BUN: 67 mg/dL — ABNORMAL HIGH (ref 8–23)
CO2: 22 mmol/L (ref 22–32)
Calcium: 8.2 mg/dL — ABNORMAL LOW (ref 8.9–10.3)
Chloride: 112 mmol/L — ABNORMAL HIGH (ref 98–111)
Creatinine, Ser: 1.63 mg/dL — ABNORMAL HIGH (ref 0.61–1.24)
GFR, Estimated: 46 mL/min — ABNORMAL LOW (ref >60)
Glucose, Bld: 135 mg/dL — ABNORMAL HIGH (ref 70–99)
Potassium: 4.5 mmol/L (ref 3.5–5.1)
Sodium: 139 mmol/L (ref 135–145)

## 2022-03-21 LAB — PROTIME-INR
INR: 1.2 (ref 0.8–1.2)
INR: 1.2 (ref 0.8–1.2)
Prothrombin Time: 14.6 seconds (ref 11.4–15.2)
Prothrombin Time: 14.8 seconds (ref 11.4–15.2)

## 2022-03-21 LAB — CBC
HCT: 21.6 % — ABNORMAL LOW (ref 39.0–52.0)
HCT: 24.5 % — ABNORMAL LOW (ref 39.0–52.0)
Hemoglobin: 6.7 g/dL — ABNORMAL LOW (ref 13.0–17.0)
Hemoglobin: 7.8 g/dL — ABNORMAL LOW (ref 13.0–17.0)
MCH: 28.8 pg (ref 26.0–34.0)
MCH: 29.1 pg (ref 26.0–34.0)
MCHC: 31 g/dL (ref 30.0–36.0)
MCHC: 31.8 g/dL (ref 30.0–36.0)
MCV: 92.7 fL (ref 80.0–100.0)
MCV: 91.4 fL (ref 80.0–100.0)
Platelets: 198 10*3/uL (ref 150–400)
Platelets: 188 10*3/uL (ref 150–400)
RBC: 2.33 MIL/uL — ABNORMAL LOW (ref 4.22–5.81)
RBC: 2.68 MIL/uL — ABNORMAL LOW (ref 4.22–5.81)
RDW: 15.1 % (ref 11.5–15.5)
RDW: 15.4 % (ref 11.5–15.5)
WBC: 15.5 10*3/uL — ABNORMAL HIGH (ref 4.0–10.5)
WBC: 12.3 10*3/uL — ABNORMAL HIGH (ref 4.0–10.5)
nRBC: 0.1 % (ref 0.0–0.2)
nRBC: 0.2 % (ref 0.0–0.2)

## 2022-03-21 LAB — HIV ANTIBODY (ROUTINE TESTING W REFLEX): HIV Screen 4th Generation wRfx: NONREACTIVE

## 2022-03-21 LAB — MAGNESIUM: Magnesium: 2.2 mg/dL (ref 1.7–2.4)

## 2022-03-21 LAB — PHOSPHORUS: Phosphorus: 5 mg/dL — ABNORMAL HIGH (ref 2.5–4.6)

## 2022-03-21 LAB — PREPARE RBC (CROSSMATCH)

## 2022-03-21 MED ORDER — FOLIC ACID 5 MG/ML IJ SOLN
1.0000 mg | Freq: Every day | INTRAMUSCULAR | Status: DC
  Administered 2022-03-22 – 2022-03-26 (×4): 1 mg via INTRAVENOUS
  Filled 2022-03-21 (×7): qty 0.2

## 2022-03-21 MED ORDER — ADULT MULTIVITAMIN W/MINERALS CH
1.0000 | ORAL_TABLET | Freq: Every day | ORAL | Status: DC
  Administered 2022-03-25 – 2022-03-26 (×2): 1 via ORAL
  Filled 2022-03-21 (×5): qty 1

## 2022-03-21 MED ORDER — PANTOPRAZOLE SODIUM 40 MG PO TBEC: 40.0000 mg | DELAYED_RELEASE_TABLET | Freq: Two times a day (BID) | ORAL | Status: DC

## 2022-03-21 MED ORDER — LORAZEPAM 2 MG/ML IJ SOLN
1.0000 mg | INTRAMUSCULAR | Status: DC | PRN
  Administered 2022-03-21 – 2022-03-22 (×5): 2 mg via INTRAVENOUS
  Filled 2022-03-21 (×4): qty 1

## 2022-03-21 MED ORDER — SODIUM CHLORIDE 0.9% IV SOLUTION: Freq: Once | INTRAVENOUS | Status: DC

## 2022-03-21 MED ORDER — ENSURE ENLIVE PO LIQD
237.0000 mL | Freq: Three times a day (TID) | ORAL | Status: DC
  Administered 2022-03-23 – 2022-03-25 (×5): 237 mL via ORAL

## 2022-03-21 NOTE — Progress Notes (Signed)
Initial Nutrition Assessment  DOCUMENTATION CODES:   Non-severe (moderate) malnutrition in context of chronic illness  INTERVENTION:   Ensure Enlive po TID with diet advancement, each supplement provides 350 kcal and 20 grams of protein.  MVI, thiamine and folic acid po daily   Pt at high refeed risk; recommend monitor potassium, magnesium and phosphorus labs daily until stable  NUTRITION DIAGNOSIS:   Moderate Malnutrition related to social / environmental circumstances (ETOH abuse) as evidenced by mild fat depletion, moderate muscle depletion.  GOAL:   Patient will meet greater than or equal to 90% of their needs  MONITOR:   Diet advancement, Labs, Weight trends, Skin, I & O's  REASON FOR ASSESSMENT:   Consult Assessment of nutrition requirement/status  ASSESSMENT:   66 y/o male with h/o dilated cardiomyopathy. etoh abuse, COVID 19 (2022), CHF, PUD, hiatal hernia and CKD III who is admitted with GIB.  Pt sleeping at time of RD visit. RN requesting for RD to let pt sleep as he is rude and agitated today. RD unable to gain any nutrition related history. RD only able to obtain NFPE on pt's upper body. RD suspects pt with poor oral intake at baseline r/t etoh abuse. Pt is asking for food today but is NPO for possible EGD. RD will add supplements once pt's diet is advanced. Pt is at high refeed risk. Per chart, pt appears to have weight gain pta. RD will follow up to try and obtain the rest of NFPE and history once pt is feeling better. Pt is at high risk for developing severe malnutrition.   Medications reviewed and include: folic acid, protonix, thiamine, protonix  Labs reviewed: K 4.5 wnl, BUN 67(H), creat 1.63(H), P 5.0(H), Mg 2.2 wnl Wbc- 12.3(H), Hgb 7.8(L), Hct 24.5(L)  NUTRITION - FOCUSED PHYSICAL EXAM:  Flowsheet Row Most Recent Value  Orbital Region Mild depletion  Upper Arm Region Moderate depletion  Thoracic and Lumbar Region Mild depletion  Buccal Region Mild  depletion  Temple Region Mild depletion  Clavicle Bone Region Severe depletion  Clavicle and Acromion Bone Region Severe depletion  Scapular Bone Region Moderate depletion  Dorsal Hand Unable to assess  Patellar Region Unable to assess  Anterior Thigh Region Unable to assess  Posterior Calf Region Unable to assess  Edema (RD Assessment) None  Hair Reviewed  Eyes Reviewed  Mouth Reviewed  Skin Reviewed  Nails Reviewed   Diet Order:   Diet Order             Diet NPO time specified  Diet effective now                  EDUCATION NEEDS:   No education needs have been identified at this time  Skin:  Skin Assessment: Reviewed RN Assessment  Last BM:  PTA  Height:   Ht Readings from Last 1 Encounters:  03/20/22 5\' 9"  (1.753 m)    Weight:   Wt Readings from Last 1 Encounters:  03/21/22 69.2 kg    Ideal Body Weight:  72.7 kg  BMI:  Body mass index is 22.53 kg/m.  Estimated Nutritional Needs:   Kcal:  1900-2200kcal/day  Protein:  95-110g/day  Fluid:  1.9-2.2L/day  05/21/22 MS, RD, LDN Please refer to [facility] for RD and/or RD on-call/weekend/after hours pager

## 2022-03-21 NOTE — Progress Notes (Signed)
NAME:  John Escobar, MRN:  [id number], DOB:  [birth year], LOS: 1 ADMISSION DATE:  03/20/2022, CONSULTATION DATE:  03/20/22 REFERRING MD:  Dr. Quentin Cornwall, CHIEF COMPLAINT:  N/V   History of Present Illness/SYNOPSIS  66 year old man with chronic daily alcohol use, what appears to be severe tophaceous gouty arthritis presenting with 3 days of N/V and vomiting with dark blood and coffee ground material.  Hgb 5 in ER.  Lipase neg.  Developed possible w/d symptoms so PCCM consulted due to possible decompensation.  Agreed for admit to watch until EGD.  GI consulted in ER and rec'd octreotide/ppi gtt, NPO.  Patient denies melena, chest pain, SOB, w/d symptoms although he is pretty circumspect with questions regarding amount of alcohol use.  States he drinks 3 beers a day and has never had w/d symptoms. Denies current hallucinations.  Received ativan x 1 in ER so far.  Prior EGD in Feb 2022 showing gastritis, angioectasias and some small gastric ulcers, no varices.  Pertinent  Medical History  Gastritis/PUD Dilated cardiomyopathy with no f/u EtOH use CKD 3b Likely longstanding gout  Significant Hospital Events: Including procedures, antibiotic start and stop dates in addition to other pertinent events   6/8 admitted to ICU for GIB  Interim History / Subjective:  Admitted.  Objective   Blood pressure 120/82, pulse 91, temperature 98 F (36.7 C), resp. rate 19, height 5\' 9"  (1.753 m), weight 69.2 kg, SpO2 100 %.        Intake/Output Summary (Last 24 hours) at 03/21/2022 0719 Last data filed at 03/21/2022 0654 Gross per 24 hour  Intake 2218 ml  Output --  Net 2218 ml    Filed Weights   03/20/22 1648 03/21/22 0345  Weight: 63.5 kg 69.2 kg      Review of Systems: Gen:  Denies  fever, sweats, chills weight loss  HEENT: Denies blurred vision, double vision, ear pain, eye pain, hearing loss, nose bleeds, sore throat Cardiac:  No dizziness, chest pain or heaviness, chest tightness,edema, No  JVD Resp:   No cough, -sputum production, -shortness of breath,-wheezing, -hemoptysis,  +N/V, joint pains Other:  All other systems negative  BP 120/82   Pulse 91   Temp 98 F (36.7 C)   Resp 19   Ht 5\' 9"  (1.753 m)   Wt 69.2 kg   SpO2 100%   BMI 22.53 kg/m   Physical Examination:   General Appearance: No distress  EYES PERRLA, EOM intact.   NECK Supple, No JVD Pulmonary: normal breath sounds, No wheezing.  CardiovascularNormal S1,S2.  No m/r/g.   Abdomen: Benign, Soft, non-tender.  ALL OTHER ROS ARE NEGATIVE    ASSESSMENT AND PLAN SYNOPSIS  56 yo ETOH abuser admitted to ICU for GIB with probable DT's  GIB- suspicion for alcoholic/ASA gastritis and recurrent PUD rather than esophageal varices. - f/u INR - GI to see in AM - NPO - PPI/octreotide drip - Check RUQ [place], if no signs of cirrhosis consider DC octreotide.  If does has ascites and cirrhosis will need ceftriaxone (started in ER) continued   ACUTE ANEMIA- TRANSFUSE AS NEEDED CONSIDER TRANSFUSION  IF HGB<7 DVT PRX with TED/SCD's ONLY   KIDNEY INJURY/Renal Failure CKD stage 3B -continue Foley Catheter-assess need -Avoid nephrotoxic agents -Follow urine output, BMP -Ensure adequate renal perfusion, optimize oxygenation -Renal dose medications   Intake/Output Summary (Last 24 hours) at 03/21/2022 0722 Last data filed at 03/21/2022 0654 Gross per 24 hour  Intake 2218 ml  Output --  Net 2218 ml     SEVERE ALCOHOL WITHDRAWAL -Therapy with Thiamine and MVI -CIWA Protocol -Precedex as needed -High risk for intubation  -high risk for aspiration  CARDIAC ICU monitoring   NEUROLOGY Acute toxic metabolic encephalopathy monitor for DT's   HYPOVOLUMIA SOURCE-GIB -use vasopressors to keep MAP>65 as needed -follow ABG and LA PRN -follow up cultures -emperic ABX   INFECTIOUS DISEASE -continue antibiotics as prescribed -follow up cultures   ENDO - ICU hypoglycemic\Hyperglycemia  protocol -check FSBS per protocol    NUTRITIONAL STATUS MODERATE MALNUTRITION POA DIET-->TF's as tolerated Constipation protocol as indicated   ELECTROLYTES -follow labs as needed -replace as needed -pharmacy consultation and following     Best Practice (right click and "Reselect all SmartList Selections" daily)   Diet/type: NPO DVT prophylaxis: SCD GI prophylaxis: PPI Lines: N/A Foley:  N/A Code Status:  full code   Labs   CBC: Recent Labs  Lab 03/20/22 1712 03/21/22 0118  WBC 18.4* 15.5*  NEUTROABS 15.5*  --   HGB 5.0* 6.7*  HCT 17.7* 21.6*  MCV 96.2 92.7  PLT 344 198     Basic Metabolic Panel: Recent Labs  Lab 03/20/22 1712 03/21/22 0348  NA 137 139  K 4.2 4.5  CL 109 112*  CO2 21* 22  GLUCOSE 192* 135*  BUN 73* 67*  CREATININE 1.88* 1.63*  CALCIUM 8.6* 8.2*  MG  --  2.2  PHOS  --  5.0*    GFR: Estimated Creatinine Clearance: 44.2 mL/min (A) (by C-G formula based on SCr of 1.63 mg/dL (H)). Recent Labs  Lab 03/20/22 1712 03/21/22 0118  WBC 18.4* 15.5*     Liver Function Tests: Recent Labs  Lab 03/20/22 1712 03/21/22 0348  AST 27 41  ALT 13 13  ALKPHOS 46 40  BILITOT 0.5 0.5  PROT 6.8 6.0*  ALBUMIN 3.3* 2.9*    Recent Labs  Lab 03/20/22 1712  LIPASE 42    No results for input(s): "AMMONIA" in the last 168 hours.  ABG    Component Value Date/Time   HCO3 16.6 (L) 08/01/2021 0832   ACIDBASEDEF 7.6 (H) 08/01/2021 0832   O2SAT 85.6 08/01/2021 0832     Coagulation Profile: Recent Labs  Lab 03/21/22 0118 03/21/22 0348  INR 1.2 1.2    Cardiac Enzymes: No results for input(s): "CKTOTAL", "CKMB", "CKMBINDEX", "TROPONINI" in the last 168 hours.  HbA1C: No results found for: "HGBA1C"  CBG: Recent Labs  Lab 03/20/22 2123  GLUCAP 112*    Past Medical History:  He,  has a past medical history of CKD (chronic kidney disease), stage III (HCC), COVID-19 virus infection w/ Sepsis (11/2020), Dilated cardiomyopathy  (HCC), ETOH abuse, Gastritis, HFrEF (heart failure with reduced ejection fraction) (HCC), Hiatal hernia, Iron deficiency anemia, and PUD (peptic ulcer disease).   Surgical History:   Past Surgical History:  Procedure Laterality Date   ELBOW BURSA SURGERY     ESOPHAGOGASTRODUODENOSCOPY (EGD) WITH PROPOFOL N/A 11/29/2020   Procedure: ESOPHAGOGASTRODUODENOSCOPY (EGD) WITH PROPOFOL;  Surgeon: Toledo, Boykin Nearing, MD;  Location: [facility] ENDOSCOPY;  Service: Gastroenterology;  Laterality: N/A;   SHOULDER SURGERY Left      Social History:   reports that he has never smoked. He has never used smokeless tobacco. He reports current alcohol use. He reports that he does not currently use drugs.   Family History:  His family history is not on file.   Allergies No Known Allergies   Home Medications  Prior to Admission medications  Medication Sig Start Date End Date Taking? Authorizing Provider  Aspirin-Acetaminophen-Caffeine (GOODY HEADACHE PO) Take 1 Package by mouth daily as needed.    [provider]  bismuth subsalicylate (PEPTO BISMOL) 262 MG/15ML suspension Take 30 mLs by mouth every 6 (six) hours as needed.    [provider]  furosemide (LASIX) 40 MG tablet Take 1 tablet (40 mg total) by mouth daily. Patient not taking: Reported on 03/20/2022 08/06/21   Sharen Hones, MD  iron polysaccharides (NIFEREX) 150 MG capsule Take 1 capsule (150 mg total) by mouth daily. Patient not taking: Reported on 03/20/2022 08/06/21   Sharen Hones, MD  lisinopril (ZESTRIL) 2.5 MG tablet Take 1 tablet (2.5 mg total) by mouth daily. Patient not taking: Reported on 03/20/2022 08/06/21   Sharen Hones, MD  metoprolol succinate (TOPROL-XL) 25 MG 24 hr tablet Take 0.5 tablets (12.5 mg total) by mouth daily. Patient not taking: Reported on 03/20/2022 08/06/21   Sharen Hones, MD  pantoprazole (PROTONIX) 40 MG tablet Take 1 tablet (40 mg total) by mouth 2 (two) times daily before a meal. Patient not taking:  Reported on 03/20/2022 08/06/21   Sharen Hones, MD  simethicone (MYLICON) 80 MG chewable tablet Chew 1 tablet (80 mg total) by mouth every 6 (six) hours as needed for flatulence. Patient not taking: Reported on 03/20/2022 08/06/21   Sharen Hones, MD      DVT/GI PRX  assessed I Assessed the need for Labs I Assessed the need for Foley I Assessed the need for Central Venous Line Family Discussion when available I Assessed the need for Mobilization I made an Assessment of medications to be adjusted accordingly Safety Risk assessment completed  CASE DISCUSSED IN MULTIDISCIPLINARY ROUNDS WITH ICU TEAM     Critical Care Time devoted to patient care services described in this note is 45 minutes.  Critical care was necessary to treat /prevent imminent and life-threatening deterioration. Overall, patient is critically ill, prognosis is guarded.  Patient with Multiorgan failure and at high risk for cardiac arrest and death.    Corrin Piano, M.D.  [facility]  Medical Director [place] Director [facility] Cardio-Pulmonary Department

## 2022-03-21 NOTE — Progress Notes (Signed)
Patient's oxygen saturations is 85%, attempted to place patient on 2L of [place] and explained to patient his oxygen saturation is low, patient states, "there is nothing wrong my oxygen, get out of my room."  Dr. Belia Heman notified of patient refusal.  Will continue to monitor patient.

## 2022-03-21 NOTE — Progress Notes (Signed)
VS stable Not on pressors Not on oxygen Patient is very rude and vulgar to GI doc and nursing staff and myself Patient refusing interventions at this time  I recommend Palliative care services to address goals of care Will stop Octreotide, will start feeding, change to oral PPI Transfer to Gen med floor with telemetry   Janelie Goltz Santiago Glad, M.D.  [facility]  Medical Director [facility] Medical Director [facility] Cardio-Pulmonary Department

## 2022-03-21 NOTE — Progress Notes (Signed)
Patient is adamant at refusing care, saying he does not want anything, updated that his son had called, demanded to be left alone.

## 2022-03-21 NOTE — Plan of Care (Signed)
Continuing with plan of care. 

## 2022-03-21 NOTE — Consult Note (Signed)
PHARMACY CONSULT NOTE  Pharmacy Consult for Electrolyte Monitoring and Replacement   Recent Labs: Potassium (mmol/L)  Date Value  03/21/2022 4.5  09/07/2012 4.0   Magnesium (mg/dL)  Date Value  54/65/6812 2.2  04/28/2012 2.5 (H)   Calcium (mg/dL)  Date Value  75/17/0017 8.2 (L)   Calcium, Total (mg/dL)  Date Value  49/44/9675 8.8   Albumin (g/dL)  Date Value  91/63/8466 2.9 (L)  09/06/2012 4.4   Phosphorus (mg/dL)  Date Value  59/93/5701 5.0 (H)   Sodium (mmol/L)  Date Value  03/21/2022 139  09/07/2012 137   Assessment: Patient is a 66 y/o M with medical history including EtOH use disorder, gastritis / PUD, dilated cardiomyopathy, CKD, gout who is admitted with UGIB secondary to 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 Pharmacy consulted to assist with electrolyte monitoring and replacement as indicated.  Goal of Therapy:  Electrolytes within normal limits  Plan:  --No electrolyte replacement indicated at this time --Follow-up electrolytes with AM labs tomorrow  Tressie Ellis 03/21/2022 9:02 AM

## 2022-03-21 NOTE — TOC Initial Note (Signed)
Transition of Care [facility]) - Initial/Assessment Note    Patient Details  Name: John Escobar MRN: [id number] Date of Birth: [birth year]  [facility]) CM/SW Contact:    Allayne Butcher, RN Phone Number: 03/21/2022, 11:09 AM  Clinical Narrative:                 Patient admitted to the hospital with GI bleeding.  Patient is currently in the ICU, Stepdown status.  RNCM attempted to speak with patient but unable to wake patient up to have meaningful conversation. This RNCM has previously seen patient in 2022 and he was living with his adult children, no transportation and no PCP.  He was referred to [facility].  TOC will follow and speak with patient when he is able.    Expected Discharge Plan:  (TBD) Barriers to Discharge: Continued Medical Work up   Patient Goals and CMS Choice Patient states their goals for this hospitalization and ongoing recovery are:: unable to states goals to lethargic      Expected Discharge Plan and Services Expected Discharge Plan:  (TBD)   Discharge Planning Services: CM Consult   Living arrangements for the past 2 months: Apartment                                      Prior Living Arrangements/Services Living arrangements for the past 2 months: Apartment Lives with:: Adult Children                   Activities of Daily Living      Permission Sought/Granted                  Emotional Assessment Appearance:: Appears older than stated age Attitude/Demeanor/Rapport: Lethargic, Unable to Assess Affect (typically observed): Unable to Assess   Alcohol / Substance Use: Alcohol Use Psych Involvement: No (comment)  Admission diagnosis:  GIB (gastrointestinal bleeding) [K92.2] Acute GI bleeding [K92.2] Alcohol withdrawal syndrome without complication (HCC) [F10.930] Symptomatic anemia [D64.9] Patient Active Problem List   Diagnosis Date Noted   GIB (gastrointestinal bleeding) 03/20/2022   Acute on chronic  combined systolic and diastolic CHF (congestive heart failure) (HCC) 08/02/2021   Acute HFrEF (heart failure with reduced ejection fraction) (HCC)    Symptomatic anemia 07/31/2021   Hyponatremia 07/31/2021   AKI (acute kidney injury) (HCC) 07/31/2021   Troponin I above reference range 07/31/2021   Acute anemia 11/28/2020   Generalized weakness 11/28/2020   Elevated serum creatinine 11/28/2020   COVID-19 virus infection 11/28/2020   Lactic acidosis 11/28/2020   Severe sepsis (HCC) 11/28/2020   Chronic alcohol abuse 11/28/2020   PCP:  Oneita Hurt No Pharmacy:   [facility] [address] ([address]), [place] - [address] [place]) [place] [address] Phone: [phone] Fax: [phone]     Social Determinants of Health (SDOH) Interventions    Readmission Risk Interventions     No data to display

## 2022-03-21 NOTE — Progress Notes (Signed)
Refused to have temperature taken.

## 2022-03-21 NOTE — Progress Notes (Signed)
Throughout the day patient has been spoken in a rude manner to myself and other staff who are offering care to him.  Patient was changed to Regular diet by Dr. Mortimer Fries, as patient is refusing to have an endoscopy at this time and is demanding to eat, patient requested a drink and when asked what he would like became rude and spoke in an aggressive tone.  The patient later soiled his bed and nurse tech offered to clean him up and bath him, the patient said, "he didn't give a rats ass," and refused to be bathed, proceeded to talk vulgarities and talk loudly and aggressively at nurse tech. The patient then demanded to be given a bath and continue to be rude, vulgar, loud, and talk down to staff providing him care.

## 2022-03-21 NOTE — Progress Notes (Signed)
eLink Physician-Brief Progress Note Patient Name: John Escobar DOB: Jul 05, 1956 MRN: [id number]   Date of Service  03/21/2022  HPI/Events of Note  Anemia - Hgb = 6.7.  eICU Interventions  Will transfuse 1 unit PRBC.     Intervention Category Major Interventions: Other:  John Escobar 03/21/2022, 2:30 AM

## 2022-03-21 NOTE — Consult Note (Signed)
Lucilla Lame, MD [address] [place], [place] [address] Phone: [phone] Fax : [phone]  Consultation  Referring Provider:     Dr. Mortimer Fries Primary Care Physician:  Pcp, No Primary Gastroenterologist:  Dr. Alice Reichert         Reason for Consultation:     Hematemesis  Date of Admission:  03/20/2022 Date of Consultation:  03/21/2022         HPI:   John Escobar is a 66 y.o. male who has a history of alcohol dependence and was having what sounds like coffee ground emesis for 3 days.  I was consulted to see the patient for possible upper endoscopy and for his GI bleed.  Patient was put in the ICU because of his hemoglobin of 5.0 with 7 months ago being 10.1.  The patient was transfused and his hemoglobin is 7.8 this morning. Upon entering the room the patient became very vulgar when I introduced myself and I had been told by the nursing staff that he had been abusive and vulgar to them.  I spoke to the patient about his upper GI bleeding in the need to see if we can have the bleeding stopped and investigated.  He angrily said he wanted some to eat and became verbally abusive again.   Past Medical History:  Diagnosis Date   CKD (chronic kidney disease), stage III ([place])    COVID-19 virus infection w/ Sepsis 11/2020   Dilated cardiomyopathy ([place])    a. 07/2021 Echo: EF 20-25%, GrII DD. Low-nl RV fxn. Nl RVSP.  Mildly dil LA. Mod MR/TR.   ETOH abuse    Gastritis    HFrEF (heart failure with reduced ejection fraction) ([place])    a. 07/2021 Echo: EF 20-25%.   Hiatal hernia    Iron deficiency anemia    PUD (peptic ulcer disease)     Past Surgical History:  Procedure Laterality Date   ELBOW BURSA SURGERY     ESOPHAGOGASTRODUODENOSCOPY (EGD) WITH PROPOFOL N/A 11/29/2020   Procedure: ESOPHAGOGASTRODUODENOSCOPY (EGD) WITH PROPOFOL;  Surgeon: Toledo, Benay Pike, MD;  Location: [facility] ENDOSCOPY;  Service: Gastroenterology;  Laterality: N/A;   SHOULDER SURGERY Left     Prior to  Admission medications   Medication Sig Start Date End Date Taking? Authorizing Provider  Aspirin-Acetaminophen-Caffeine (GOODY HEADACHE PO) Take 1 Package by mouth daily as needed.    [provider]  bismuth subsalicylate (PEPTO BISMOL) 262 MG/15ML suspension Take 30 mLs by mouth every 6 (six) hours as needed.    [provider]  furosemide (LASIX) 40 MG tablet Take 1 tablet (40 mg total) by mouth daily. Patient not taking: Reported on 03/20/2022 08/06/21   Sharen Hones, MD  iron polysaccharides (NIFEREX) 150 MG capsule Take 1 capsule (150 mg total) by mouth daily. Patient not taking: Reported on 03/20/2022 08/06/21   Sharen Hones, MD  lisinopril (ZESTRIL) 2.5 MG tablet Take 1 tablet (2.5 mg total) by mouth daily. Patient not taking: Reported on 03/20/2022 08/06/21   Sharen Hones, MD  metoprolol succinate (TOPROL-XL) 25 MG 24 hr tablet Take 0.5 tablets (12.5 mg total) by mouth daily. Patient not taking: Reported on 03/20/2022 08/06/21   Sharen Hones, MD  pantoprazole (PROTONIX) 40 MG tablet Take 1 tablet (40 mg total) by mouth 2 (two) times daily before a meal. Patient not taking: Reported on 03/20/2022 08/06/21   Sharen Hones, MD  simethicone (MYLICON) 80 MG chewable tablet Chew 1 tablet (80 mg total) by mouth every  6 (six) hours as needed for flatulence. Patient not taking: Reported on 03/20/2022 08/06/21   Sharen Hones, MD    History reviewed. No pertinent family history.   Social History   Tobacco Use   Smoking status: Never   Smokeless tobacco: Never  Substance Use Topics   Alcohol use: Yes    Comment: says he prev drank heavily but more moderate since early 1980's.  Currently drinking ~ 6 oz/beer/day.   Drug use: Not Currently    Allergies as of 03/20/2022   (No Known Allergies)    Review of Systems:    All systems reviewed and negative except where noted in HPI.   Physical Exam:  Vital signs in last 24 hours: Temp:  [98 F (36.7 C)-98.9 F (37.2 C)] 98.3 F  (36.8 C) (06/09 0800) Pulse Rate:  [85-112] 85 (06/09 1100) Resp:  [15-22] 17 (06/09 1100) BP: (100-133)/(62-102) 110/75 (06/09 1100) SpO2:  [88 %-100 %] 93 % (06/09 1100) Weight:  [63.5 kg-69.2 kg] 69.2 kg (06/09 0345) Last BM Date :  (PTA) General:   Belligerent, cooperative in NAD Head:  Normocephalic and atraumatic. Eyes:   No icterus.   Conjunctiva pink. PERRLA. Ears:  Normal auditory acuity. Extremities:  Without edema, cyanosis or clubbing. Skin:  Intact without significant lesions or rashes. Cervical Nodes:  No significant cervical adenopathy. Psych:  Alert and angry  LAB RESULTS: Recent Labs    03/20/22 1712 03/21/22 0118 03/21/22 0913  WBC 18.4* 15.5* 12.3*  HGB 5.0* 6.7* 7.8*  HCT 17.7* 21.6* 24.5*  PLT 344 198 188   BMET Recent Labs    03/20/22 1712 03/21/22 0348  NA 137 139  K 4.2 4.5  CL 109 112*  CO2 21* 22  GLUCOSE 192* 135*  BUN 73* 67*  CREATININE 1.88* 1.63*  CALCIUM 8.6* 8.2*   LFT Recent Labs    03/21/22 0348  PROT 6.0*  ALBUMIN 2.9*  AST 41  ALT 13  ALKPHOS 40  BILITOT 0.5  BILIDIR <0.1  IBILI NOT CALCULATED   PT/INR Recent Labs    03/21/22 0118 03/21/22 0348  LABPROT 14.6 14.8  INR 1.2 1.2    STUDIES: US Abdomen Limited RUQ (LIVER/GB)  Result Date: 03/21/2022 CLINICAL DATA:  Cirrhosis. EXAM: ULTRASOUND ABDOMEN LIMITED RIGHT UPPER QUADRANT COMPARISON:  None Available. FINDINGS: Gallbladder: No gallstones or wall thickening visualized (2.6 mm). No sonographic Murphy sign noted by sonographer. Common bile duct: Diameter: 3.6 mm Liver: No focal lesion identified. Diffusely increased echogenicity of the liver parenchyma is noted. Portal vein is patent on color Doppler imaging with normal direction of blood flow towards the liver. Other: None. IMPRESSION: Hepatic steatosis without focal liver lesions or evidence to suggest the presence of hepatic cirrhosis. Electronically Signed   By: Virgina Norfolk M.D.   On: 03/21/2022 03:42    DG Abdomen 1 View  Result Date: 03/20/2022 CLINICAL DATA:  Abdomen pain vomiting EXAM: ABDOMEN - 1 VIEW COMPARISON:  None Available. FINDINGS: Nonobstructed gas pattern. Probable colon diverticular disease. No radiopaque calculi. Phleboliths in the pelvis. Advanced vascular disease IMPRESSION: Nonobstructed bowel-gas pattern Electronically Signed   By: Donavan Foil M.D.   On: 03/20/2022 18:27   DG Chest Portable 1 View  Result Date: 03/20/2022 CLINICAL DATA:  Nausea vomiting EXAM: PORTABLE CHEST 1 VIEW COMPARISON:  07/31/2021 FINDINGS: The heart size and mediastinal contours are within normal limits. Both lungs are clear. The visualized skeletal structures are unremarkable. IMPRESSION: No active disease. Electronically Signed   By: Maudie Mercury  Francoise Ceo M.D.   On: 03/20/2022 18:27      Impression / Plan:   Assessment: Principal Problem:   GIB (gastrointestinal bleeding)   OSAMAH KESTEN is a 66 y.o. y/o male with what appears to be a GI bleed.  The patient has been told the risks of not doing anything if he is bleeding.  The patient remained belligerent and said he wanted to eat and wanted nothing else done.  Plan:  The patient appears to be his own power of attorney and I have been told that he makes his own decisions.  The patient refuses any further GI intervention or work-up.  I will sign off at this time.  I will sign off.  Please call if any further GI concerns or questions.  We would like to thank you for the opportunity to participate in the care of John Escobar.   Thank you for involving me in the care of this patient.      LOS: 1 day   Lucilla Lame, MD, [facility] 03/21/2022, 1:06 PM,  Pager [phone] 7am-5pm  Check AMION for 5pm -7am coverage and on weekends   Note: This dictation was prepared with Dragon dictation along with smaller phrase technology. Any transcriptional errors that result from this process are unintentional.

## 2022-03-22 DIAGNOSIS — N183 Chronic kidney disease, stage 3 unspecified: Secondary | ICD-10-CM

## 2022-03-22 DIAGNOSIS — E440 Moderate protein-calorie malnutrition: Secondary | ICD-10-CM | POA: Diagnosis not present

## 2022-03-22 DIAGNOSIS — N1831 Chronic kidney disease, stage 3a: Secondary | ICD-10-CM

## 2022-03-22 DIAGNOSIS — R531 Weakness: Secondary | ICD-10-CM | POA: Diagnosis not present

## 2022-03-22 DIAGNOSIS — K2971 Gastritis, unspecified, with bleeding: Secondary | ICD-10-CM

## 2022-03-22 DIAGNOSIS — F1010 Alcohol abuse, uncomplicated: Secondary | ICD-10-CM

## 2022-03-22 LAB — TYPE AND SCREEN
ABO/RH(D): A POS
Antibody Screen: NEGATIVE
Unit division: 0
Unit division: 0
Unit division: 0

## 2022-03-22 LAB — BPAM RBC
Blood Product Expiration Date: 202306282359
Blood Product Expiration Date: 202306292359
Blood Product Expiration Date: 202306292359
ISSUE DATE / TIME: 202306081847
ISSUE DATE / TIME: 202306082147
ISSUE DATE / TIME: 202306090321
Unit Type and Rh: 6200
Unit Type and Rh: 6200
Unit Type and Rh: 6200

## 2022-03-22 MED ORDER — SODIUM CHLORIDE 0.9 % IV SOLN
300.0000 mg | Freq: Once | INTRAVENOUS | Status: AC
  Administered 2022-03-22: 300 mg via INTRAVENOUS
  Filled 2022-03-22: qty 300

## 2022-03-22 MED ORDER — LORAZEPAM 2 MG/ML IJ SOLN
0.0000 mg | Freq: Four times a day (QID) | INTRAMUSCULAR | Status: AC
  Administered 2022-03-23: 2 mg via INTRAVENOUS
  Administered 2022-03-23: 1 mg via INTRAVENOUS
  Filled 2022-03-22 (×2): qty 1

## 2022-03-22 MED ORDER — LORAZEPAM 1 MG PO TABS: 0.0000 mg | ORAL_TABLET | Freq: Four times a day (QID) | ORAL | Status: AC

## 2022-03-22 MED ORDER — CHLORHEXIDINE GLUCONATE CLOTH 2 % EX PADS
6.0000 | MEDICATED_PAD | Freq: Every day | CUTANEOUS | Status: DC
  Administered 2022-03-24 – 2022-03-28 (×5): 6 via TOPICAL

## 2022-03-22 MED ORDER — PANTOPRAZOLE SODIUM 40 MG IV SOLR
40.0000 mg | Freq: Two times a day (BID) | INTRAVENOUS | Status: DC
  Administered 2022-03-22 – 2022-03-28 (×11): 40 mg via INTRAVENOUS
  Filled 2022-03-22 (×12): qty 10

## 2022-03-22 NOTE — Assessment & Plan Note (Addendum)
Took 2 person assist to move around with physical therapy yesterday.  Occupational Therapy recommending rehab.  We will get transitional care team involved.

## 2022-03-22 NOTE — Plan of Care (Signed)

## 2022-03-22 NOTE — Progress Notes (Signed)
Patient was ready to get off bed pan, patient removed and no BM present.

## 2022-03-22 NOTE — Assessment & Plan Note (Signed)
Continue alcohol withdrawal protocol

## 2022-03-22 NOTE — Progress Notes (Signed)
Lab tech came by earlier this am and patient refused to have labs drawn, Dr. Leslye Peer updated.  Patient continues to be rude to staff and demands for staff to leave room when offering care on many interactions.  Patient continues to refuse most medications, patient demands for food and when when tray is brought refuses to eat.  Will continue to monitor.

## 2022-03-22 NOTE — Evaluation (Signed)
Physical Therapy Evaluation Patient Details Name: John Escobar MRN: [id number] DOB: February 05, 1956 Today's Date: 03/22/2022  History of Present Illness  John Escobar is a 65yoF who comes to [facility]  on 6/8 c N/V x3d including coffee groudns emesis. Workup showing ABLA. PMH: ETOH abuse, dilated cardiomyopathy, CKD3b, apparent severe tophaceous gouty arthritis. Pt given units of blood. Admitted last yaer for similar issue.  Clinical Impression  Pt admitted with above diagnosis. Pt currently with functional limitations due to the deficits listed below (see "PT Problem List"). Upon entry, pt in bed, asleep but not heavily. Pt responds minimally to verbal engagement but he does not especially awaken. He often does not verbally respond to questioning, unable to determine if emotionally withdrawn, cognitively impaired, or HOH. Pt is generally restless and irritable due to some pain in his back from being in the bed too long. This is his only motivation for moving today with Thereasa Parkin and it is strong. Pt does not provide info regarding prior level of function, both in tolerance and independence. Max effort with writhing in pain to move from supine to EOB left, where he is never able to obtain upright postural control. Remains collapsed on legs c/o nausea, HR rapidly elevates to 120s. After 2-3 minutes here, author encourages pt to return to bed where he quickly collapses for a few minutes. Pt pulled back up toward HOB at totalA, HOB elevated to 20 degrees. Pt urged by RN and Thereasa Parkin to allow for f/u CBC and assess Hb status, but he does not respond. Patient's performance this date reveals decreased ability, independence, and tolerance in performing all basic mobility required for performance of activities of daily living. Pt requires additional DME, close physical assistance, and cues for safe participate in mobility. Pt will benefit from skilled PT intervention to increase independence and safety with basic mobility in  preparation for discharge to the venue listed below.       Recommendations for follow up therapy are one component of a multi-disciplinary discharge planning process, led by the attending physician.  Recommendations may be updated based on patient status, additional functional criteria and insurance authorization.  Follow Up Recommendations Home health PT    Assistance Recommended at Discharge Frequent or constant Supervision/Assistance  Patient can return home with the following  Two people to help with walking and/or transfers;Two people to help with bathing/dressing/bathroom;Assistance with cooking/housework    Equipment Recommendations    Recommendations for Other Services       Functional Status Assessment Patient has had a recent decline in their functional status and demonstrates the ability to make significant improvements in function in a reasonable and predictable amount of time.     Precautions / Restrictions Precautions Precautions: Fall Restrictions Weight Bearing Restrictions: No      Mobility  Bed Mobility Overal bed mobility: Needs Assistance Bed Mobility: Supine to Sit, Sit to Supine     Supine to sit: Min guard Sit to supine: Min guard   General bed mobility comments: maxA to reposition in bed    Transfers Overall transfer level:  (deferred. Poor EOB tolerance, nauseated, tachycardic in 120s. Mission aborted.)                      Ambulation/Gait                  Stairs            Wheelchair Mobility    Modified Rankin (Stroke Patients Only)  Balance                                             Pertinent Vitals/Pain Pain Assessment Pain Assessment: Faces Faces Pain Scale: Hurts whole lot Pain Location: low back and ABDCT intermittently Pain Descriptors / Indicators: Aching, Moaning Pain Intervention(s): Limited activity within patient's tolerance, Monitored during session, Repositioned     Home Living Family/patient expects to be discharged to:: Private residence Living Arrangements: Children   Type of Home: Apartment Home Access: Stairs to enter Entrance Stairs-Rails: Right Entrance Stairs-Number of Steps: 13   Home Layout: One level Home Equipment: Agricultural consultant (2 wheels);Cane - single point Additional Comments: taken from admission earlier in year, pt does not provide info when asked    Prior Function                       Hand Dominance        Extremity/Trunk Assessment                Communication      Cognition Arousal/Alertness: Awake/alert Behavior During Therapy: Impulsive, Flat affect Overall Cognitive Status: Difficult to assess                                          General Comments      Exercises     Assessment/Plan    PT Assessment Patient needs continued PT services  PT Problem List Decreased strength;Decreased activity tolerance;Decreased balance;Decreased mobility;Decreased coordination;Pain;Decreased safety awareness;Decreased knowledge of precautions       PT Treatment Interventions DME instruction;Balance training;Gait training;Stair training;Functional mobility training;Therapeutic activities;Therapeutic exercise;Patient/family education;Neuromuscular re-education    PT Goals (Current goals can be found in the Care Plan section)  Acute Rehab PT Goals PT Goal Formulation: Patient unable to participate in goal setting    Frequency Min 2X/week     Co-evaluation               AM-PAC PT "6 Clicks" Mobility  Outcome Measure Help needed turning from your back to your side while in a flat bed without using bedrails?: A Lot Help needed moving from lying on your back to sitting on the side of a flat bed without using bedrails?: A Lot Help needed moving to and from a bed to a chair (including a wheelchair)?: Total Help needed standing up from a chair using your arms (e.g., wheelchair or  bedside chair)?: Total Help needed to walk in hospital room?: Total Help needed climbing 3-5 steps with a railing? : Total 6 Click Score: 8    End of Session   Activity Tolerance: Patient limited by fatigue;Patient limited by lethargy;Patient limited by pain Patient left: in bed;with call bell/phone within reach;with bed alarm set Nurse Communication: Mobility status;Precautions PT Visit Diagnosis: Muscle weakness (generalized) (M62.81);Difficulty in walking, not elsewhere classified (R26.2)    Time: 8466-5993 PT Time Calculation (min) (ACUTE ONLY): 20 min   Charges:   PT Evaluation $PT Eval Moderate Complexity: 1 Mod         3:28 PM, 03/22/22 Rosamaria Lints, PT, DPT Physical Therapist - [facility]  [phone] (ASCOM)    John Escobar C 03/22/2022, 3:23 PM

## 2022-03-22 NOTE — Assessment & Plan Note (Deleted)
CKD stage IIIa.  Creatinine 1.37 today with a GFR 57

## 2022-03-22 NOTE — Progress Notes (Signed)
Spoke with the patient's son Perlie Gold at [phone].  I told him that his father has refused labs so I do not know where his last blood count means.  I told him that his father refused endoscopy.  I told him that physical therapy took 2 people to move him around.  His son stated that normally is not this week and is able to walk around with a walker.  I advised his son to talk to his father about allowing me to draw labs for tomorrow.  Dr. Alford Highland

## 2022-03-22 NOTE — Consult Note (Signed)
PHARMACY CONSULT NOTE  Pharmacy Consult for Electrolyte Monitoring and Replacement   Recent Labs: Potassium (mmol/L)  Date Value  03/21/2022 4.5  09/07/2012 4.0   Magnesium (mg/dL)  Date Value  19/14/7829 2.2  04/28/2012 2.5 (H)   Calcium (mg/dL)  Date Value  56/21/3086 8.2 (L)   Calcium, Total (mg/dL)  Date Value  57/84/6962 8.8   Albumin (g/dL)  Date Value  95/28/4132 2.9 (L)  09/06/2012 4.4   Phosphorus (mg/dL)  Date Value  44/10/270 5.0 (H)   Sodium (mmol/L)  Date Value  03/21/2022 139  09/07/2012 137   Assessment: Patient is a 66 y/o M with medical history including EtOH use disorder, gastritis / PUD, dilated cardiomyopathy, CKD, gout who is admitted with UGIB secondary to 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 Pharmacy consulted to assist with electrolyte monitoring and replacement as indicated.  Goal of Therapy:  Electrolytes within normal limits  Plan:  --6/10 Patient refused blood draw so far today. --Follow-up electrolytes with AM labs tomorrow  Eve Rey A 03/22/2022 1:48 PM

## 2022-03-22 NOTE — Assessment & Plan Note (Addendum)
Received 3 units of packed red blood cells during the hospital course.  I gave IV iron.  Hemoglobin today 7.8.  Reviewing prior endoscopies he does have a history of a gastric ulcer and angiectasia's.  Still not eating.

## 2022-03-22 NOTE — Progress Notes (Signed)
Patient's lunch tray has been delivered and offered to patient, patient did not eat any of his lunch.

## 2022-03-22 NOTE — Assessment & Plan Note (Addendum)
Moderate malnutrition with poor appetite.

## 2022-03-22 NOTE — Progress Notes (Signed)
Spoke with patient regarding ambulating and patient in agreement to do so as he would like to go home.  Myself and the nurse tech was able to get patient in a sitting position on the side of the bed and patient was unable to stand, became weak and dizzy, was not even able to put hands on the grips of the walker.  Advised patient to lay back down in bed as I felt he was at risk to fall, patient was compliant and laid down, further encouraged the patient to allow labs to be drawn, patient did not agree to this.  Dr. Leslye Peer on unit and updated.  Patient is currently on bedpan and having a bowel movement.  Will continue to monitor patient.

## 2022-03-22 NOTE — Consult Note (Deleted)
PHARMACY CONSULT NOTE  Pharmacy Consult for Electrolyte Monitoring and Replacement   Recent Labs: Potassium (mmol/L)  Date Value  03/21/2022 4.5  09/07/2012 4.0   Magnesium (mg/dL)  Date Value  82/95/6213 2.2  04/28/2012 2.5 (H)   Calcium (mg/dL)  Date Value  08/65/7846 8.2 (L)   Calcium, Total (mg/dL)  Date Value  96/29/5284 8.8   Albumin (g/dL)  Date Value  13/24/4010 2.9 (L)  09/06/2012 4.4   Phosphorus (mg/dL)  Date Value  27/25/3664 5.0 (H)   Sodium (mmol/L)  Date Value  03/21/2022 139  09/07/2012 137   Assessment: Patient is a 66 y/o M with medical history including EtOH use disorder, gastritis / PUD, dilated cardiomyopathy, CKD, gout who is admitted with UGIB secondary to 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 Pharmacy consulted to assist with electrolyte monitoring and replacement as indicated.  Goal of Therapy:  Electrolytes within normal limits  Plan:  --No electrolyte replacement indicated at this time --Follow-up electrolytes with AM labs tomorrow  Jaylise Peek A 03/22/2022 9:16 AM

## 2022-03-22 NOTE — Progress Notes (Signed)
  Progress Note   Patient: John Escobar [id number] DOB: 06-May-1956 DOA: 03/20/2022     2 DOS: the patient was seen and examined on 03/22/2022    Assessment and Plan: * GIB (gastrointestinal bleeding) Received 3 units of packed red blood cells during the hospital course.  I gave IV iron today.  Refused further blood draws.  Refused GI work-up.  Reviewing prior endoscopies he does have a history of a gastric ulcer and angiectasia's.  Did not eat much today.  No bowel movement this morning.  Protonix switch back to IV.  Chronic kidney disease (CKD), stage III (moderate) ([facility]) CKD stage IIIa.  Creatinine 1.63 on last blood draw with a GFR of 46  Malnutrition of moderate degree Moderate malnutrition.  Weakness Patient unable to walk with nursing staff.  We will get physical therapy evaluation.  Alcohol abuse Continue alcohol withdrawal protocol        Subjective: Patient refused blood draw this morning.  Patient refused endoscopy yesterday.  Was unable to walk with nursing staff today.  Patient just wanted to get out of the hospital this morning.  Felt a little dizzy with trying to move around with nursing staff.  Admitted with GI bleed.  Physical Exam: Vitals:   03/22/22 0900 03/22/22 1000 03/22/22 1100 03/22/22 1200  BP: (!) 142/92 111/80 121/83 132/85  Pulse: (!) 101 88 87 (!) 112  Resp: 13 15 16 11   Temp:      TempSrc:      SpO2: 99% 96% 98% 98%  Weight:      Height:       Physical Exam HENT:     Head: Normocephalic.     Mouth/Throat:     Pharynx: No oropharyngeal exudate.  Eyes:     General: Lids are normal.     Conjunctiva/sclera: Conjunctivae normal.  Cardiovascular:     Rate and Rhythm: Normal rate and regular rhythm.     Heart sounds: Normal heart sounds, S1 normal and S2 normal.  Pulmonary:     Breath sounds: No decreased breath sounds, wheezing, rhonchi or rales.  Abdominal:     Palpations: Abdomen is soft.     Tenderness: There is no abdominal  tenderness.  Musculoskeletal:     Right lower leg: No swelling.     Left lower leg: No swelling.     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  Skin:    General: Skin is warm.     Findings: No rash.  Neurological:     Mental Status: He is alert.     Comments: Answer some simple questions.  Just wanted to be left alone.     Data Reviewed: Refused blood draw today  Family Communication: Left 2 messages for patient's daughter  Disposition: Status is: Inpatient Remains inpatient appropriate because: Unable to walk with nursing staff.  Refused blood draw. Planned Discharge Destination: To be determined based on clinical course   Author: Loletha Grayer, MD 03/22/2022 12:45 PM  For on call review [email or web address].

## 2022-03-22 NOTE — Plan of Care (Signed)
Patient is continuously encouraged to help with care plan progression.

## 2022-03-23 DIAGNOSIS — M1A9XX1 Chronic gout, unspecified, with tophus (tophi): Secondary | ICD-10-CM | POA: Diagnosis not present

## 2022-03-23 DIAGNOSIS — K2971 Gastritis, unspecified, with bleeding: Secondary | ICD-10-CM | POA: Diagnosis not present

## 2022-03-23 DIAGNOSIS — R627 Adult failure to thrive: Secondary | ICD-10-CM | POA: Diagnosis not present

## 2022-03-23 DIAGNOSIS — N1831 Chronic kidney disease, stage 3a: Secondary | ICD-10-CM | POA: Diagnosis not present

## 2022-03-23 LAB — HEPATIC FUNCTION PANEL
ALT: 14 U/L (ref 0–44)
AST: 35 U/L (ref 15–41)
Albumin: 3 g/dL — ABNORMAL LOW (ref 3.5–5.0)
Alkaline Phosphatase: 49 U/L (ref 38–126)
Bilirubin, Direct: 0.1 mg/dL (ref 0.0–0.2)
Indirect Bilirubin: 0.4 mg/dL (ref 0.3–0.9)
Total Bilirubin: 0.5 mg/dL (ref 0.3–1.2)
Total Protein: 6.7 g/dL (ref 6.5–8.1)

## 2022-03-23 LAB — CBC
HCT: 25.2 % — ABNORMAL LOW (ref 39.0–52.0)
Hemoglobin: 7.6 g/dL — ABNORMAL LOW (ref 13.0–17.0)
MCH: 28.8 pg (ref 26.0–34.0)
MCHC: 30.2 g/dL (ref 30.0–36.0)
MCV: 95.5 fL (ref 80.0–100.0)
Platelets: 203 10*3/uL (ref 150–400)
RBC: 2.64 MIL/uL — ABNORMAL LOW (ref 4.22–5.81)
RDW: 15.7 % — ABNORMAL HIGH (ref 11.5–15.5)
WBC: 13.9 10*3/uL — ABNORMAL HIGH (ref 4.0–10.5)
nRBC: 0 % (ref 0.0–0.2)

## 2022-03-23 LAB — BASIC METABOLIC PANEL
Anion gap: 10 (ref 5–15)
BUN: 26 mg/dL — ABNORMAL HIGH (ref 8–23)
CO2: 23 mmol/L (ref 22–32)
Calcium: 8.8 mg/dL — ABNORMAL LOW (ref 8.9–10.3)
Chloride: 112 mmol/L — ABNORMAL HIGH (ref 98–111)
Creatinine, Ser: 1.37 mg/dL — ABNORMAL HIGH (ref 0.61–1.24)
GFR, Estimated: 57 mL/min — ABNORMAL LOW (ref >60)
Glucose, Bld: 116 mg/dL — ABNORMAL HIGH (ref 70–99)
Potassium: 3.6 mmol/L (ref 3.5–5.1)
Sodium: 145 mmol/L (ref 135–145)

## 2022-03-23 LAB — PHOSPHORUS: Phosphorus: 4.3 mg/dL (ref 2.5–4.6)

## 2022-03-23 LAB — MAGNESIUM: Magnesium: 2.1 mg/dL (ref 1.7–2.4)

## 2022-03-23 LAB — PROTIME-INR
INR: 1.1 (ref 0.8–1.2)
Prothrombin Time: 14.4 seconds (ref 11.4–15.2)

## 2022-03-23 LAB — URIC ACID: Uric Acid, Serum: 10.4 mg/dL — ABNORMAL HIGH (ref 3.7–8.6)

## 2022-03-23 MED ORDER — COLCHICINE 0.6 MG PO TABS
0.6000 mg | ORAL_TABLET | Freq: Every day | ORAL | Status: DC
  Administered 2022-03-24 – 2022-03-25 (×2): 0.6 mg via ORAL
  Filled 2022-03-23 (×2): qty 1

## 2022-03-23 MED ORDER — METOPROLOL SUCCINATE ER 25 MG PO TB24
25.0000 mg | ORAL_TABLET | Freq: Every day | ORAL | Status: DC
  Administered 2022-03-23 – 2022-03-28 (×5): 25 mg via ORAL
  Filled 2022-03-23 (×6): qty 1

## 2022-03-23 NOTE — Evaluation (Addendum)
Occupational Therapy Evaluation Patient Details Name: John Escobar MRN: [id number] DOB: 1956-03-26 Today's Date: 03/23/2022   History of Present Illness John Escobar is a 65yoF who comes to [facility]  on 6/8 c N/V x3d including coffee groudns emesis. Workup showing ABLA. PMH: ETOH abuse, dilated cardiomyopathy, CKD3b, apparent severe tophaceous gouty arthritis. admitted last year for similar issue.   Clinical Impression   John Escobar was seen for OT evaluation this date. Pt presents with confusion and agitation upon OT arrival to room. Per nsg notes, pt has been agitated, yelling at staff consistently overnight. Upon arrival pt states "If they don't let me out of here I'm walking out on my own". OT attempted to provide therapeutic use of self t/o session and educate pt on safety and role of OT in acute setting. Education limited. Pt very self limiting with mobility attempts as well. Pt refuses to answer A&O questions or provide information re: PLOF other than he lives with his children in the same home as his past admission. Information on PLOF obtained primarily via chart review. Limited information available regarding pt baseline functional independence level. Currently pt demonstrates impairments including generalized weakness, decreased activity tolerance, and decreased safety awareness which functionally limit his/her ability to perform ADL/self-care tasks. Pt currently requires MAX A for bed level toileting using a urinal, he is able to drink from a straw cup with SET UP assist, but demonstrates decreased sequencing for placement of straw into cup and requires support. Anticipate +2 assist for safety with mobility attempts and LB ADL management from STS.  Pt would benefit from skilled OT services to address noted impairments and functional limitations (see below for any additional details) in order to maximize safety and independence while minimizing falls risk and caregiver burden. Upon hospital  discharge, recommend STR to maximize pt safety and return to PLOF.        Recommendations for follow up therapy are one component of a multi-disciplinary discharge planning process, led by the attending physician.  Recommendations may be updated based on patient status, additional functional criteria and insurance authorization.   Follow Up Recommendations  Skilled nursing-short term rehab (<3 hours/day)    Assistance Recommended at Discharge Frequent or constant Supervision/Assistance  Patient can return home with the following A lot of help with bathing/dressing/bathroom;A lot of help with walking and/or transfers;Assistance with cooking/housework;Help with stairs or ramp for entrance;Assist for transportation;Direct supervision/assist for medications management;Direct supervision/assist for financial management    Functional Status Assessment  Patient has had a recent decline in their functional status and demonstrates the ability to make significant improvements in function in a reasonable and predictable amount of time.  Equipment Recommendations  BSC/3in1    Recommendations for Other Services       Precautions / Restrictions Precautions Precautions: Fall Restrictions Weight Bearing Restrictions: No      Mobility Bed Mobility Overal bed mobility: Needs Assistance             General bed mobility comments: Deferred. Pt refuses, begins to become agitated with cueing for EOB activity.    Transfers                          Balance                                           ADL either performed  or assessed with clinical judgement   ADL Overall ADL's : Needs assistance/impaired                                       General ADL Comments: Refuses OOB/EOB activity adamantly 2/2 LBP. Requires MAX A for bed level toileting using urinal. Performs brief LB clean up when provided with set up of wash cloth after toileting.  Anticipate Mod-Max A +2 for safety with mobility/STS attempts.     Vision Patient Visual Report: No change from baseline       Perception     Praxis      Pertinent Vitals/Pain Pain Assessment Faces Pain Scale: Hurts even more Pain Location: Low back pain with mobement. Pain Descriptors / Indicators: Aching, Moaning Pain Intervention(s): Limited activity within patient's tolerance, Monitored during session, Repositioned     Hand Dominance     Extremity/Trunk Assessment Upper Extremity Assessment Upper Extremity Assessment: Generalized weakness   Lower Extremity Assessment Lower Extremity Assessment: Generalized weakness       Communication     Cognition Arousal/Alertness: Awake/alert Behavior During Therapy: Impulsive, Flat affect, Agitated Overall Cognitive Status: Difficult to assess                                 General Comments: No family present to determine baseline. Pt becomes increasingly agitated with A&O questions tells this author, "You're an idiot just like that other guy".     General Comments       Exercises Other Exercises Other Exercises: Pt educated on role of OT in acute setting, importance of mobility during hospitalization, and DC recs. Limited acceptance of education.   Shoulder Instructions      Home Living Family/patient expects to be discharged to:: Private residence Living Arrangements: Children   Type of Home: Apartment Home Access: Stairs to enter [employer] of Steps: 13 Entrance Stairs-Rails: Right Home Layout: One level               Home Equipment: Agricultural consultant (2 wheels);Cane - single point   Additional Comments: taken from admission earlier in year, pt provides limited information regarding PLOF when asked. States his living situation is the same as prior admission.      Prior Functioning/Environment                          OT Problem List: Decreased strength;Decreased  coordination;Decreased range of motion;Decreased cognition;Decreased activity tolerance;Decreased safety awareness;Impaired balance (sitting and/or standing);Decreased knowledge of use of DME or AE      OT Treatment/Interventions: Self-care/ADL training;Therapeutic exercise;Therapeutic activities;Energy conservation;DME and/or AE instruction;Patient/family education;Cognitive remediation/compensation    OT Goals(Current goals can be found in the care plan section) Acute Rehab OT Goals Patient Stated Goal: to go home OT Goal Formulation: With patient Time For Goal Achievement: 04/06/22 Potential to Achieve Goals: Good ADL Goals Pt Will Perform Grooming: sitting;with set-up;with supervision Pt Will Perform Lower Body Dressing: sit to/from stand;with min assist;with adaptive equipment (c LRAD PRN) Pt Will Transfer to Toilet: bedside commode;with set-up;with supervision;stand pivot transfer (c LRAD PRN) Pt Will Perform Toileting - Clothing Manipulation and hygiene: sit to/from stand;with supervision;with set-up;with adaptive equipment (c LRAD PRN)  OT Frequency: Min 1X/week    Co-evaluation  AM-PAC OT "6 Clicks" Daily Activity     Outcome Measure Help from another person eating meals?: A Little Help from another person taking care of personal grooming?: A Lot Help from another person toileting, which includes using toliet, bedpan, or urinal?: A Lot Help from another person bathing (including washing, rinsing, drying)?: Total Help from another person to put on and taking off regular upper body clothing?: A Lot Help from another person to put on and taking off regular lower body clothing?: Total 6 Click Score: 11   End of Session Nurse Communication: Mobility status;Other (comment) (assisted pt with urinal.)  Activity Tolerance: Other (comment);Patient limited by pain (limited by cognition.) Patient left: in bed;with call bell/phone within reach;with bed alarm set  OT  Visit Diagnosis: Other abnormalities of gait and mobility (R26.89);Muscle weakness (generalized) (M62.81)                Time: [id number] OT Time Calculation (min): 14 min Charges:  OT General Charges $OT Visit: 1 Visit OT Evaluation $OT Eval Moderate Complexity: 1 Mod  Verania Salberg Smith RobertKingsbury, M.S., OTR/L Ascom: [phone] 03/23/22, 11:18 AM

## 2022-03-23 NOTE — Assessment & Plan Note (Signed)
Switch to prednisone taper.  Continue low-dose colchicine.

## 2022-03-23 NOTE — Progress Notes (Signed)
  Progress Note   Patient: John Escobar [id number] DOB: 07-Nov-1955 DOA: 03/20/2022     3 DOS: the patient was seen and examined on 03/23/2022    Assessment and Plan: * GIB (gastrointestinal bleeding) Received 3 units of packed red blood cells during the hospital course.  I gave IV iron yesrday.  Hemoglobin today 7.6.  Refused blood transfusion today..  Refused GI work-up.  Reviewing prior endoscopies he does have a history of a gastric ulcer and angiectasia's.  Did not eat any breakfast.  Chronic kidney disease (CKD), stage III (moderate) (HCC) CKD stage IIIa.  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  Start colchicine on a daily basis.  Will hopefully start allopurinol also in a couple days.  Malnutrition of moderate degree Moderate malnutrition with poor appetite.  Weakness Took 2 person assist to move around with physical therapy yesterday.  Occupational Therapy recommending rehab.  We will get transitional care team involved.  Failure to thrive in adult Will get palliative care consultation with him not eating  Alcohol abuse Continue alcohol withdrawal protocol        Subjective: Patient declined to order breakfast while is in the room.  Patient just wants to get out of here.  He was unable to walk yesterday with physical therapy needed to be able to move around.  Declined blood transfusion today.  Physical Exam: Vitals:   03/22/22 2034 03/23/22 0058 03/23/22 0346 03/23/22 0742  BP:  (!) 148/89 135/81 (!) 147/38  Pulse:  (!) 110 (!) 105 (!) 110  Resp:  16 17 16   Temp:  98.3 F (36.8 C) 98.6 F (37 C) 98 F (36.7 C)  TempSrc:      SpO2:  95% 97% 97%  Weight: 66 kg     Height:       Physical Exam HENT:     Head: Normocephalic.     Mouth/Throat:     Pharynx: No oropharyngeal exudate.  Eyes:     General: Lids are normal.     Conjunctiva/sclera: Conjunctivae normal.  Cardiovascular:     Rate and  Rhythm: Normal rate and regular rhythm.     Heart sounds: Normal heart sounds, S1 normal and S2 normal.  Pulmonary:     Breath sounds: No decreased breath sounds, wheezing, rhonchi or rales.  Abdominal:     Palpations: Abdomen is soft.     Tenderness: There is no abdominal tenderness.  Musculoskeletal:     Right lower leg: No swelling.     Left lower leg: No swelling.     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  Skin:    General: Skin is warm.     Findings: No rash.  Neurological:     Mental Status: He is alert.     Comments: Answers no to most questions.     Data Reviewed: Uric acid 10.4, creatinine 1.27, hemoglobin 7.6  Family Communication: Spoke with patient's son at [phone]  Disposition: Status is: Inpatient Remains inpatient appropriate because: He will need to walk in order to go home.  Patient declined blood transfusion and GI work-up.  Patient not eating. Planned Discharge Destination: Rehab.  Occupational Therapy now recommending rehab.  TOC consultation.   Author: Loletha Grayer, MD 03/23/2022 12:55 PM  For on call review [email or web address].

## 2022-03-23 NOTE — Progress Notes (Incomplete)
Patient has declined throughout my shift.  Attempted to ambulate

## 2022-03-23 NOTE — Assessment & Plan Note (Signed)
Will get palliative care consultation with him not eating

## 2022-03-23 NOTE — Progress Notes (Signed)
Patient has declined care and medication throughout shift.  Declined ambulation, oral care and several medications.  Stated on numerous occasions that he wanted to leave.  I did call his daughter who is listed as contact and left message for her to call me.  Dr. Leslye Peer aware of non compliance and desire to leave AMA.

## 2022-03-23 NOTE — Consult Note (Signed)
[facility] for Electrolyte Monitoring and Replacement   Recent Labs: Potassium (mmol/L)  Date Value  03/23/2022 3.6  09/07/2012 4.0   Magnesium (mg/dL)  Date Value  03/23/2022 2.1  04/28/2012 2.5 (H)   Calcium (mg/dL)  Date Value  03/23/2022 8.8 (L)   Calcium, Total (mg/dL)  Date Value  09/07/2012 8.8   Albumin (g/dL)  Date Value  03/23/2022 3.0 (L)  09/06/2012 4.4   Phosphorus (mg/dL)  Date Value  03/23/2022 4.3   Sodium (mmol/L)  Date Value  03/23/2022 145  09/07/2012 137   Assessment: Patient is a 66 y/o M with medical history including EtOH use disorder, gastritis / PUD, dilated cardiomyopathy, CKD, gout who is admitted with UGIB secondary to 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 Pharmacy consulted to assist with electrolyte monitoring and replacement as indicated.  Goal of Therapy:  Electrolytes within normal limits  Plan:  --No electrolyte replacement at this time --Follow-up electrolytes with AM labs tomorrow  Timarie Labell A 03/23/2022 9:01 AM

## 2022-03-24 DIAGNOSIS — Z515 Encounter for palliative care: Secondary | ICD-10-CM

## 2022-03-24 DIAGNOSIS — D62 Acute posthemorrhagic anemia: Secondary | ICD-10-CM | POA: Diagnosis not present

## 2022-03-24 DIAGNOSIS — F10930 Alcohol use, unspecified with withdrawal, uncomplicated: Secondary | ICD-10-CM | POA: Diagnosis not present

## 2022-03-24 DIAGNOSIS — F1010 Alcohol abuse, uncomplicated: Secondary | ICD-10-CM | POA: Diagnosis not present

## 2022-03-24 DIAGNOSIS — K2971 Gastritis, unspecified, with bleeding: Secondary | ICD-10-CM | POA: Diagnosis not present

## 2022-03-24 DIAGNOSIS — F10151 Alcohol abuse with alcohol-induced psychotic disorder with hallucinations: Secondary | ICD-10-CM

## 2022-03-24 LAB — PHOSPHORUS: Phosphorus: 3.9 mg/dL (ref 2.5–4.6)

## 2022-03-24 LAB — HEMOGLOBIN: Hemoglobin: 7.8 g/dL — ABNORMAL LOW (ref 13.0–17.0)

## 2022-03-24 LAB — BASIC METABOLIC PANEL
Anion gap: 11 (ref 5–15)
BUN: 24 mg/dL — ABNORMAL HIGH (ref 8–23)
CO2: 23 mmol/L (ref 22–32)
Calcium: 8.9 mg/dL (ref 8.9–10.3)
Chloride: 111 mmol/L (ref 98–111)
Creatinine, Ser: 1.39 mg/dL — ABNORMAL HIGH (ref 0.61–1.24)
GFR, Estimated: 56 mL/min — ABNORMAL LOW (ref >60)
Glucose, Bld: 113 mg/dL — ABNORMAL HIGH (ref 70–99)
Potassium: 3.5 mmol/L (ref 3.5–5.1)
Sodium: 145 mmol/L (ref 135–145)

## 2022-03-24 LAB — MAGNESIUM: Magnesium: 2.3 mg/dL (ref 1.7–2.4)

## 2022-03-24 MED ORDER — METHYLPREDNISOLONE SODIUM SUCC 40 MG IJ SOLR
40.0000 mg | Freq: Every day | INTRAMUSCULAR | Status: DC
  Administered 2022-03-24 – 2022-03-25 (×2): 40 mg via INTRAVENOUS
  Filled 2022-03-24 (×2): qty 1

## 2022-03-24 MED ORDER — THIAMINE HCL 100 MG/ML IJ SOLN
500.0000 mg | Freq: Three times a day (TID) | INTRAVENOUS | Status: AC
  Administered 2022-03-24 – 2022-03-26 (×6): 500 mg via INTRAVENOUS
  Filled 2022-03-24 (×6): qty 5

## 2022-03-24 MED ORDER — LORAZEPAM 2 MG/ML IJ SOLN
0.0000 mg | Freq: Four times a day (QID) | INTRAMUSCULAR | Status: DC
  Administered 2022-03-24 – 2022-03-26 (×4): 2 mg via INTRAVENOUS
  Filled 2022-03-24 (×4): qty 1

## 2022-03-24 MED ORDER — OXYCODONE HCL 5 MG PO TABS
5.0000 mg | ORAL_TABLET | Freq: Four times a day (QID) | ORAL | Status: DC | PRN
  Administered 2022-03-28 (×3): 5 mg via ORAL
  Filled 2022-03-24 (×3): qty 1

## 2022-03-24 MED ORDER — PROSOURCE PLUS PO LIQD
30.0000 mL | Freq: Three times a day (TID) | ORAL | Status: DC
  Administered 2022-03-25: 30 mL via ORAL
  Filled 2022-03-24 (×13): qty 30

## 2022-03-24 MED ORDER — LORAZEPAM 2 MG PO TABS: 0.0000 mg | ORAL_TABLET | Freq: Four times a day (QID) | ORAL | Status: DC

## 2022-03-24 NOTE — Progress Notes (Signed)
OT Cancellation Note  Patient Details Name: John Escobar MRN: [id number] DOB: October 27, 1955   Cancelled Treatment:    Reason Eval/Treat Not Completed: Pain limiting ability to participate;Fatigue/lethargy limiting ability to participate;Patient declined, no reason specified. OT attempted to see pt for follow up tx session this am. Upon attempt MD in room with pt, pt noted to be c/o pain in B hands. Does not open eyes or engage much other than to endorse pain and states, "leave me alone". Will re-attempt at a later date/time as available and pt medically appropriate for OT services.   Rockney Ghee, M.S., OTR/L Ascom: [phone] 03/24/22, 9:55 AM

## 2022-03-24 NOTE — Progress Notes (Signed)
  Progress Note   Patient: John Escobar [id number] DOB: 06-01-1956 DOA: 03/20/2022     4 DOS: the patient was seen and examined on 03/24/2022     Assessment and Plan: * GIB (gastrointestinal bleeding), acute blood loss anemia Received 3 units of packed red blood cells during the hospital course.  I gave IV iron.  Hemoglobin today 7.8.  Refused GI work-up.  Reviewing prior endoscopies he does have a history of a gastric ulcer and angiectasia's.  Not eating well.  Continue IV Protonix  Chronic kidney disease (CKD), stage III (moderate) (HCC) CKD stage IIIa.  Creatinine 1.39 today with a GFR 56  Tophaceous gout Of numerous joints with elevated uric acid.  With pain in his hands today start Solu-Medrol.  Continue colchicine  Malnutrition of moderate degree Moderate malnutrition with poor appetite.  Weakness Patient very weak and will likely refuse rehab  Failure to thrive in adult Will get palliative care consultation with him not eating  Alcohol abuse, alcohol withdrawal with hallucinations as per son Continue alcohol withdrawal protocol.  We will change thiamine to high-dose for 2 days.  Restart alcohol withdrawal protocol.        Subjective: Son concerned that he was hallucinating last night.  Patient does drink 2-3 beers per day and is an alcoholic.  Patient today was complaining of pain in his hands.  Patient is not eating very much.  I asked him if he was depressed and he said no.  He just wanted to be left alone.  Physical Exam: Vitals:   03/23/22 1930 03/24/22 0421 03/24/22 0808 03/24/22 1224  BP: (!) 108/57 (!) 147/90 (!) 145/87 120/66  Pulse: (!) 113 (!) 113 (!) 111 100  Resp: 20  (!) 22 20  Temp: 99 F (37.2 C) 97.9 F (36.6 C) 99.6 F (37.6 C) 99.6 F (37.6 C)  TempSrc:  Oral Oral Oral  SpO2: 100% 98% 98% 97%  Weight:      Height:       Physical Exam HENT:     Head: Normocephalic.     Mouth/Throat:     Pharynx: No oropharyngeal exudate.  Eyes:      General: Lids are normal.     Conjunctiva/sclera: Conjunctivae normal.  Cardiovascular:     Rate and Rhythm: Normal rate and regular rhythm.     Heart sounds: Normal heart sounds, S1 normal and S2 normal.  Pulmonary:     Breath sounds: No decreased breath sounds, wheezing, rhonchi or rales.  Abdominal:     Palpations: Abdomen is soft.     Tenderness: There is no abdominal tenderness.  Musculoskeletal:     Right lower leg: No swelling.     Left lower leg: No swelling.     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  Skin:    General: Skin is warm.     Findings: No rash.  Neurological:     Mental Status: He is alert.     Comments: Answers no to most questions.     Data Reviewed: Creatinine 1.39, hemoglobin 7.8  Family Communication: Spoke with son on the phone  Disposition: Status is: Inpatient Remains inpatient appropriate because: Patient not eating much.  Complaining of pain in his hands today.  Son states he was hallucinating last night.  Planned Discharge Destination: Home   Author: Loletha Grayer, MD 03/24/2022 2:51 PM  For on call review [email or web address].

## 2022-03-24 NOTE — Progress Notes (Signed)
     Referral received for John Escobar re: goals of care discussion. Chart reviewed and updates received from RN. Patient assessed and is unable to engage appropriately in discussions.  I was able to speak with patient's son John Escobar. GOC meeting scheduled for tomorrow, 6/13, at 2pm via telephone. John Escobar shares his sister will be available via telephone with him tomorrow at 2pm as well.   Detailed note and recommendations to follow once GOC has been completed.   Thank you for your referral and allowing PMT to assist in Mr. John Escobar's care.   Georgiann Cocker, FNP-BC Palliative Medicine Team  Phone: [phone]  NO CHARGE

## 2022-03-24 NOTE — Consult Note (Signed)
PHARMACY CONSULT NOTE  Pharmacy Consult for Electrolyte Monitoring and Replacement   Recent Labs: Potassium (mmol/L)  Date Value  03/24/2022 3.5  09/07/2012 4.0   Magnesium (mg/dL)  Date Value  51/76/1607 2.3  04/28/2012 2.5 (H)   Calcium (mg/dL)  Date Value  37/07/6268 8.9   Calcium, Total (mg/dL)  Date Value  48/54/6270 8.8   Albumin (g/dL)  Date Value  35/00/9381 3.0 (L)  09/06/2012 4.4   Phosphorus (mg/dL)  Date Value  82/99/3716 3.9   Sodium (mmol/L)  Date Value  03/24/2022 145  09/07/2012 137   Assessment: Patient is a 66 y/o M with medical history including EtOH use disorder, gastritis / PUD, dilated cardiomyopathy, CKD, gout who is admitted with UGIB secondary to 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 Pharmacy consulted to assist with electrolyte monitoring and replacement as indicated.  Goal of Therapy:  Electrolytes within normal limits  Plan:  Electrolytes stable at LLN; Scr stable (BL unclear). I/O: - (UOP 1.1>0.7>0.3 ml/k/h) --No electrolyte replacement at this time --Follow-up electrolytes with AM labs tomorrow  Martyn Malay 03/24/2022 7:46 AM

## 2022-03-24 NOTE — Progress Notes (Signed)
Nutrition Follow-up  DOCUMENTATION CODES:   Non-severe (moderate) malnutrition in context of chronic illness  INTERVENTION:   -Continue Ensure Enlive po TID, each supplement provides 350 kcal and 20 grams of protein -30 ml Prosource Plus TID, each supplement provides 100 kcals and 15 grams protein -Continue MVI with minerals daily -Initiate 48 hour calorie count per MD request  NUTRITION DIAGNOSIS:   Moderate Malnutrition related to social / environmental circumstances (ETOH abuse) as evidenced by mild fat depletion, moderate muscle depletion.  Ongoing  GOAL:   Patient will meet greater than or equal to 90% of their needs  Unmet  MONITOR:   Diet advancement, Labs, Weight trends, Skin, I & O's  REASON FOR ASSESSMENT:   Consult Assessment of nutrition requirement/status  ASSESSMENT:   66 y/o male with h/o dilated cardiomyopathy. etoh abuse, COVID 19 (2022), CHF, PUD, hiatal hernia and CKD III who is admitted with GIB.  Reviewed I/O's: -500 ml x 24 hours and -216 ml since admission  UOP: 500 ml x 24 hours   Pt moaning in pain with eyes closed at time of visit. RD did not disturb. No family at bedside. Lunch tray was unattempted. Pt consumed about 50% of an Ensure supplement. Per meal completion data, pt consumed 0% of breakfast.   Per chart review, pt has been refusing care (including ambulation, oral care, and medications). RN has documented that pt has refused to eat meals on multiple occasions. MD requesting calorie count. Palliative care consultation pending for goals of care discussions.   Medications reviewed and include folic acid, ativan, and solu-medrol.   Labs reviewed: CBGS: 112.  Diet Order:   Diet Order             Diet regular Room service appropriate? Yes; Fluid consistency: Thin  Diet effective now                   EDUCATION NEEDS:   No education needs have been identified at this time  Skin:  Skin Assessment: Reviewed RN  Assessment  Last BM:  Unknown  Height:   Ht Readings from Last 1 Encounters:  03/20/22 5\' 9"  (1.753 m)    Weight:   Wt Readings from Last 1 Encounters:  03/22/22 66 kg    Ideal Body Weight:  72.7 kg  BMI:  Body mass index is 21.49 kg/m.  Estimated Nutritional Needs:   Kcal:  1900-2200kcal/day  Protein:  95-110g/day  Fluid:  1.9-2.2L/day    05/22/22, RD, LDN, CDCES Registered Dietitian II Certified Diabetes Care and Education Specialist Please refer to [facility] for RD and/or RD on-call/weekend/after hours pager

## 2022-03-24 NOTE — TOC Progression Note (Signed)
Transition of Care [facility]) - Progression Note    Patient Details  Name: DEUNDRA BARD MRN: [id number] Date of Birth: Sep 04, 1956  Transition of [facility] CM/SW Contact  Maree Krabbe, LCSW Phone Number: 03/24/2022, 9:00 AM  Clinical Narrative:   CSW spoke with pt's son. Pt's son stating pt lives with him and his sister. Pt's son stating pt will refuse SNF and probably not work with [facility]. However, pt's son is agreeable to CSW setting up [facility]- with no agency preference.     Expected Discharge Plan:  (TBD) Barriers to Discharge: Continued Medical Work up  Expected Discharge Plan and Services Expected Discharge Plan:  (TBD)   Discharge Planning Services: CM Consult   Living arrangements for the past 2 months: Apartment                                       Social Determinants of Health (SDOH) Interventions    Readmission Risk Interventions     No data to display

## 2022-03-25 DIAGNOSIS — N179 Acute kidney failure, unspecified: Secondary | ICD-10-CM

## 2022-03-25 DIAGNOSIS — K2971 Gastritis, unspecified, with bleeding: Secondary | ICD-10-CM | POA: Diagnosis not present

## 2022-03-25 DIAGNOSIS — D62 Acute posthemorrhagic anemia: Secondary | ICD-10-CM | POA: Diagnosis not present

## 2022-03-25 DIAGNOSIS — R627 Adult failure to thrive: Secondary | ICD-10-CM | POA: Diagnosis not present

## 2022-03-25 DIAGNOSIS — F10930 Alcohol use, unspecified with withdrawal, uncomplicated: Secondary | ICD-10-CM | POA: Diagnosis not present

## 2022-03-25 DIAGNOSIS — N189 Chronic kidney disease, unspecified: Secondary | ICD-10-CM

## 2022-03-25 DIAGNOSIS — Z789 Other specified health status: Secondary | ICD-10-CM

## 2022-03-25 LAB — BASIC METABOLIC PANEL
Anion gap: 7 (ref 5–15)
BUN: 41 mg/dL — ABNORMAL HIGH (ref 8–23)
CO2: 25 mmol/L (ref 22–32)
Calcium: 8.9 mg/dL (ref 8.9–10.3)
Chloride: 113 mmol/L — ABNORMAL HIGH (ref 98–111)
Creatinine, Ser: 1.75 mg/dL — ABNORMAL HIGH (ref 0.61–1.24)
GFR, Estimated: 43 mL/min — ABNORMAL LOW (ref >60)
Glucose, Bld: 141 mg/dL — ABNORMAL HIGH (ref 70–99)
Potassium: 4 mmol/L (ref 3.5–5.1)
Sodium: 145 mmol/L (ref 135–145)

## 2022-03-25 LAB — HEMOGLOBIN: Hemoglobin: 7.8 g/dL — ABNORMAL LOW (ref 13.0–17.0)

## 2022-03-25 LAB — RHEUMATOID FACTOR: Rheumatoid fact SerPl-aCnc: 14.2 IU/mL — ABNORMAL HIGH (ref <14.0)

## 2022-03-25 MED ORDER — COLCHICINE 0.6 MG PO TABS
0.3000 mg | ORAL_TABLET | Freq: Every day | ORAL | Status: DC
  Administered 2022-03-26 – 2022-03-28 (×2): 0.3 mg via ORAL
  Filled 2022-03-25: qty 1
  Filled 2022-03-25: qty 0.5
  Filled 2022-03-25 (×2): qty 1
  Filled 2022-03-25 (×2): qty 0.5

## 2022-03-25 MED ORDER — ASCORBIC ACID 500 MG PO TABS
500.0000 mg | ORAL_TABLET | Freq: Two times a day (BID) | ORAL | Status: DC
  Administered 2022-03-25 – 2022-03-26 (×2): 500 mg via ORAL
  Filled 2022-03-25 (×6): qty 1

## 2022-03-25 MED ORDER — SODIUM CHLORIDE 0.9 % IV SOLN: INTRAVENOUS | Status: DC

## 2022-03-25 MED ORDER — PREDNISONE 10 MG PO TABS: 10.0000 mg | ORAL_TABLET | Freq: Every day | ORAL | Status: DC

## 2022-03-25 NOTE — Assessment & Plan Note (Signed)
Hemoglobin stable last 3 days at 7.8.

## 2022-03-25 NOTE — Progress Notes (Addendum)
Progress Note   Patient: John Escobar VHQ:[id number] DOB: 19-Nov-1955 DOA: 03/20/2022     5 DOS: the patient was seen and examined on 03/25/2022   Brief hospital course: 66 year old man with past medical history of chronic kidney disease, alcohol abuse, dilated cardiomyopathy, iron deficiency anemia, prior GI bleeds with angiectasia's and peptic ulcer disease.  He was admitted by the critical care specialist on 03/20/2022 for nausea vomiting and coffee-ground emesis.  Initial hemoglobin was low at 5.0.  He received a total of 3 units of packed red blood cells and 1 IV iron infusion during the hospital course.  Hemoglobin relatively stable over the last 3 days at 7.8.  Unfortunately the patient is not eating or drinking anything.  Palliative care to meet with the family today.    Assessment and Plan: * GIB (gastrointestinal bleeding) Received 3 units of packed red blood cells during the hospital course.  I gave IV iron.  Hemoglobin today 7.8.  Reviewing prior endoscopies he does have a history of a gastric ulcer and angiectasia's.  Still not eating.  Acute blood loss anemia Hemoglobin stable last 3 days at 7.8.  Failure to thrive in adult Palliative care to meet with family today.  Patient not eating.  Calorie count undergoing.  Acute kidney injury superimposed on CKD (HCC) Creatinine worsened today to 1.75.  Was 1.39 yesterday.  We will start gentle IV fluids.  Malnutrition of moderate degree Moderate malnutrition with poor appetite.  Tophaceous gout Switch to prednisone taper.  Continue low-dose colchicine.  Weakness Patient not doing much with physical therapy.  They are recommending rehab but patient will likely refuse.  Alcohol withdrawal syndrome without complication (HCC) High-dose thiamine started yesterday.  Alcohol withdrawal protocol reordered yesterday.   With cracking around the lips, I will add vitamin C  Addendum: Nursing staff stated they have to convince the patient  that the food is not poisoned for him to eat anything.  We will get psychiatric consultation.     Subjective: Patient told me he did not like me.  Patient called me a liar when I asked him if his hands were still hurting today.  I have asked him if there is anything he would like to discuss today and he said "yes, go to hell."  Physical Exam: Vitals:   03/24/22 1638 03/24/22 2008 03/25/22 0425 03/25/22 0737  BP: 121/78 123/78 130/83 131/83  Pulse: (!) 103 (!) 102 (!) 102 89  Resp: 18 17    Temp: 99.3 F (37.4 C) 98.6 F (37 C) 98.7 F (37.1 C) 98.2 F (36.8 C)  TempSrc: Oral Oral Oral   SpO2: 94% 98% 99% 93%  Weight:      Height:       Physical Exam HENT:     Head: Normocephalic.     Mouth/Throat:     Pharynx: No oropharyngeal exudate.     Comments: Cracking around his lips. Eyes:     General: Lids are normal.     Conjunctiva/sclera: Conjunctivae normal.  Cardiovascular:     Rate and Rhythm: Normal rate and regular rhythm.     Heart sounds: Normal heart sounds, S1 normal and S2 normal.  Pulmonary:     Breath sounds: No decreased breath sounds, wheezing, rhonchi or rales.  Abdominal:     Palpations: Abdomen is soft.     Tenderness: There is no abdominal tenderness.  Musculoskeletal:     Right lower leg: No swelling.     Left lower leg: No  swelling.     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  Skin:    General: Skin is warm.     Findings: No rash.  Neurological:     Mental Status: He is alert.     Comments: Answers no to most questions.     Data Reviewed: Creatinine up to 1.75 today, last uric acid 10.4, hemoglobin 7.8  Family Communication: Spoke with son on the phone  Disposition: Status is: Inpatient Remains inpatient appropriate because: Patient is not eating.  Palliative care to meet with family today Planned Discharge Destination: To be determined.  If he is not eating would be a candidate for the hospice home.  Author: Alford Highland,  MD 03/25/2022 1:32 PM  For on call review [email or web address].

## 2022-03-25 NOTE — Progress Notes (Signed)
Calorie Count Day 1  Estimated Nutritional Needs:    Kcal:  1900-2200kcal/day Protein:  95-110g/day Fluid:  1.9-2.2L/day  Dinner 6/12: pt refused meal, drank 50% of an Ensure   Total- 175kcal and 10g protein   Breakfast 6/13: pt refused meal  Total- 0  Lunch: sips/bites pot roast, mashed potatoes and a cookie, 100% of an Ensure   Total- 415kcal and 23g protein   Supplements:  Ensure Enlive po TID, each supplement provides 350 kcal and 20 grams of protein.  Prostat liquid protein PO 30 ml TID with meals, each supplement provides 100 kcal, 15 grams protein.  Total Intake:  590kcal (31% estimated needs) and  33g protein (35% estimated needs)  Koleen Distance MS, RD, LDN Please refer to [facility] for RD and/or RD on-call/weekend/after hours pager

## 2022-03-25 NOTE — Progress Notes (Signed)
OT Cancellation Note  Patient Details Name: John Escobar MRN: [id number] DOB: [birth year]   Cancelled Treatment:    Reason Eval/Treat Not Completed: Other (comment). Pt and family members meeting this afternoon with palliative care team to discuss goals of care. Will proceed with OT services as appropriate following this meeting.  Latina Craver 03/25/2022, 1:02 PM

## 2022-03-25 NOTE — Consult Note (Signed)
PHARMACY CONSULT NOTE  Pharmacy Consult for Electrolyte Monitoring and Replacement   Recent Labs: Potassium (mmol/L)  Date Value  03/25/2022 4.0  09/07/2012 4.0   Magnesium (mg/dL)  Date Value  27/51/7001 2.3  04/28/2012 2.5 (H)   Calcium (mg/dL)  Date Value  74/94/4967 8.9   Calcium, Total (mg/dL)  Date Value  59/16/3846 8.8   Albumin (g/dL)  Date Value  65/99/3570 3.0 (L)  09/06/2012 4.4   Phosphorus (mg/dL)  Date Value  17/79/3903 3.9   Sodium (mmol/L)  Date Value  03/25/2022 145  09/07/2012 137   Assessment: Patient is a 66 y/o M with medical history including EtOH use disorder, gastritis / PUD, dilated cardiomyopathy, CKD, gout who is admitted with UGIB secondary to 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 Pharmacy consulted to assist with electrolyte monitoring and replacement (CCM).   Palliative care consult pending.  Goal of Therapy:  Electrolytes within normal limits  Plan:  Electrolytes stable and WNL No electrolyte replacement at this time Will dc CCM and follow up as needed  Klaus Casteneda Rodriguez-Guzman PharmD, BCPS 03/25/2022 8:14 AM

## 2022-03-25 NOTE — Progress Notes (Signed)
PT Cancellation Note  Patient Details Name: John Escobar MRN: [id number] DOB: July 02, 1956   Cancelled Treatment:    Reason Eval/Treat Not Completed: Other (comment). Pt and family members meeting this afternoon with palliative care team to discuss goals of care. Will proceed with PT services as appropriate following this meeting.  3:08 PM, 03/25/22 Rosamaria Lints, PT, DPT Physical Therapist - [facility]  [phone] (ASCOM)    Libia Fazzini C 03/25/2022, 3:06 PM

## 2022-03-25 NOTE — Assessment & Plan Note (Signed)
High-dose thiamine started yesterday.  Alcohol withdrawal protocol reordered yesterday.

## 2022-03-25 NOTE — Consult Note (Signed)
Attempted consult on patient for possible paranoia surrounding foodo being poisoned. Patient sleeping soundly and unable to be awakened  after calling his name loudly x4. Will attempt again tomorrow.  Vanetta Mulders, PMHNP-BC

## 2022-03-25 NOTE — Consult Note (Cosign Needed)
Consultation Note Date: 03/25/2022   Patient Name: John Escobar  DOB: 1956-07-12  MRN: [id number]  Age / Sex: 66 y.o., male  PCP: Pcp, No Referring Physician: Alford Highland, MD  Reason for Consultation: Establishing goals of care  HPI/Patient Profile: 66 y.o. male  with past medical history of EtOH disorder, gastritis, PUD, dilated cardiomyopathy, CKD, gout, and angiectasia admitted on 03/20/2022 with upper GI bleed secondary to 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  Patient has refused endoscopy.  Patient is not eating and drinking.  PMT was consulted to discuss failure to thrive and help clarify goals of care.   Clinical Assessment and Goals of Care: I have reviewed medical records including EPIC notes, labs and imaging, assessed the patient and attempted to include the patient in a telephone discussion with patient's son Perlie Gold and daughter Samara Deist.  I asked the patient if he had had anything to eat or drink and he said no.  When asked if he would like something he said he did not want anything.  I shared that if he did not eat and drink enough then he would-and before I could complete my sentence patient interjected and said "I will just die and you will be made a cheater".  I reviewed with patient that he needs nutrition and hydration to sustain life.  However, patient continued to say that I was a liar and that I should just "get the f**k out of here".  After I interaction with the patient, I spoke with son Perlie Gold and daughter Missy over the phone to discuss diagnosis prognosis, GOC, EOL wishes, disposition and options.  I introduced Palliative Medicine as specialized medical care for people living with serious illness. It focuses on providing relief from the symptoms and stress of a serious illness. The goal is to improve quality of life for both the patient and the family.  We discussed a brief life review of  the patient.  Family shares the patient worked briefly at [employer], which she hated.  They shared that patient enjoyed drinking alcohol excessively and singing Hank Williams/Elvis songs at the top of his lungs.  As far as functional and nutritional status family shares patient had a healthy appetite and was mostly independent with ADLs up to this admission.  We discussed patient's current illness and what it means in the larger context of patient's on-going co-morbidities.  Natural disease trajectory and expectations for EOL if patient continues to decline hydration and nutrition were discussed.  Family states they think the patient has anxiety and depression and that is why he is not wanting to eat.  We reviewed that patient had significant anemia and is also experiencing alcohol withdrawal which contribute to patient's confusion.  Family was clear they believe it is not these issues but that it is him being in an unfamiliar environment that is making him confused and not wanting to eat.  They again reiterated that they think he is having confusion due to anxiety and depression.  I attempted to elicit values and goals of care  important to the patient.  Family shares they want him to live but understand that he cannot be forced to do anything he does not want to do.  The difference between aggressive medical intervention and comfort care was considered in light of the patient's goals of care.  Inpatient hospice home, hospice at home, and skilled nursing facility reviewed his disposition options.  Family wishes they could take him home but understand that this level of care he was requiring is not 1 that they can provide.  Education offered regarding concept specific to human mortality and the limitations of medical interventions to prolong life when the body begins to fail to thrive.  Family is facing treatment option decisions, advanced directive, and anticipatory care needs.    Discussed with family  the importance of continued conversation with family and the medical providers regarding overall plan of care and treatment options, ensuring decisions are within the context of the patient's values and GOCs.    Questions and concerns were addressed. The family was encouraged to call with questions or concerns.    Discussed above interaction with Dr. Renae Gloss.  Psych consult recommended.  I am on service tomorrow and plan to round on the patient speak with family again.    Primary Decision Maker PATIENT  Code Status/Advance Care Planning: Full code  Prognosis:   < 6 weeks if patient continues to decline food/drink  Discharge Planning: To Be Determined  Primary Diagnoses: Present on Admission:  GIB (gastrointestinal bleeding)   Physical Exam Vitals reviewed.  Constitutional:      General: He is not in acute distress.    Appearance: He is not toxic-appearing.  HENT:     Head: Normocephalic.     Mouth/Throat:     Mouth: Mucous membranes are dry.     Comments: Cracking lips, poor dentition Cardiovascular:     Pulses: Normal pulses.  Pulmonary:     Effort: Pulmonary effort is normal.  Abdominal:     Palpations: Abdomen is soft.  Musculoskeletal:     Comments: Generalized weakness  Skin:    General: Skin is warm.  Neurological:     Comments: Pt irritable and did not participate in orientation questions     Palliative Assessment/Data: 40%     I discussed this patient's plan of care with patient, patient's son Perlie Gold and dtr Missy, RN Ashly.  Thank you for this consult. Palliative medicine will continue to follow and assist holistically.   Time Total: 75 minutes Greater than 50%  of this time was spent counseling and coordinating care related to the above assessment and plan.  Signed by: Georgiann Cocker, DNP, FNP-BC Palliative Medicine    Please contact Palliative Medicine Team phone at [phone] for questions and concerns.  For individual provider: See  Loretha Stapler

## 2022-03-25 NOTE — Assessment & Plan Note (Signed)
Creatinine worsened today to 1.75.  Was 1.39 yesterday.  We will start gentle IV fluids.

## 2022-03-26 ENCOUNTER — Telehealth: Payer: Self-pay | Admitting: Cardiovascular Disease

## 2022-03-26 DIAGNOSIS — K2971 Gastritis, unspecified, with bleeding: Secondary | ICD-10-CM | POA: Diagnosis not present

## 2022-03-26 DIAGNOSIS — D62 Acute posthemorrhagic anemia: Secondary | ICD-10-CM | POA: Diagnosis not present

## 2022-03-26 DIAGNOSIS — F10930 Alcohol use, unspecified with withdrawal, uncomplicated: Secondary | ICD-10-CM | POA: Diagnosis not present

## 2022-03-26 DIAGNOSIS — K922 Gastrointestinal hemorrhage, unspecified: Secondary | ICD-10-CM | POA: Diagnosis not present

## 2022-03-26 DIAGNOSIS — R627 Adult failure to thrive: Secondary | ICD-10-CM | POA: Diagnosis not present

## 2022-03-26 DIAGNOSIS — N179 Acute kidney failure, unspecified: Secondary | ICD-10-CM | POA: Diagnosis not present

## 2022-03-26 LAB — BASIC METABOLIC PANEL
Anion gap: 8 (ref 5–15)
BUN: 46 mg/dL — ABNORMAL HIGH (ref 8–23)
CO2: 24 mmol/L (ref 22–32)
Calcium: 8.5 mg/dL — ABNORMAL LOW (ref 8.9–10.3)
Chloride: 107 mmol/L (ref 98–111)
Creatinine, Ser: 1.58 mg/dL — ABNORMAL HIGH (ref 0.61–1.24)
GFR, Estimated: 48 mL/min — ABNORMAL LOW (ref >60)
Glucose, Bld: 132 mg/dL — ABNORMAL HIGH (ref 70–99)
Potassium: 3.9 mmol/L (ref 3.5–5.1)
Sodium: 139 mmol/L (ref 135–145)

## 2022-03-26 LAB — CBC
HCT: 23.8 % — ABNORMAL LOW (ref 39.0–52.0)
Hemoglobin: 7.2 g/dL — ABNORMAL LOW (ref 13.0–17.0)
MCH: 28.3 pg (ref 26.0–34.0)
MCHC: 30.3 g/dL (ref 30.0–36.0)
MCV: 93.7 fL (ref 80.0–100.0)
Platelets: 254 10*3/uL (ref 150–400)
RBC: 2.54 MIL/uL — ABNORMAL LOW (ref 4.22–5.81)
RDW: 14.5 % (ref 11.5–15.5)
WBC: 13.6 10*3/uL — ABNORMAL HIGH (ref 4.0–10.5)
nRBC: 0 % (ref 0.0–0.2)

## 2022-03-26 MED ORDER — LACTATED RINGERS IV SOLN
INTRAVENOUS | Status: DC
Start: 2022-03-26 — End: 2022-03-28

## 2022-03-26 NOTE — Progress Notes (Signed)
Progress Note  Patient: John Escobar [id number] DOB: 06/30/1956  DOA: 03/20/2022  DOS: 03/26/2022    Brief hospital course: John Escobar is a 66 y.o. male with a history of stage IIIa CKD, alcohol abuse, dilated cardiomyopathy, iron deficiency anemia and GI bleeding due to PUD and gastrointestinal AVMs who was admitted 6/8 due to acute symptomatic anemia due to coffee-ground emesis. Hemoglobin improved from 5 to 7.8 after 3u PRBCs, and the patient has declined endoscopic evaluation. Hospitalization complicated by paranoia and refusal of care for which psychiatry and palliative care have been consulted.  Assessment and Plan: Acute blood loss anemia due to suspected GI bleeding: Hx gastric ulcer and AVMs, both possible culprits. - s/p 3u PRBCs.  - s/p IV iron - Monitor CBC, hgb down to 7.2 - Pt declining endoscopic evaluations. Will continue PPI empirically  Failure to thrive, moderate protein calorie malnutrition: Calorie count confirms patient is only meeting about 1/3 of his nutritional needs. This is complicated by paranoia which may improve with medications and/or discharge home. His prognosis on this course is quite guarded.  - Palliative care consulted to elicit goals of care.    AKI on stage IIIa CKD: SCr 1.8 on arrival, improved to putative baseline of 1.3.  - Monitor daily, now creeping back upward with limited po intake and widening of BUN:Cr. Will augment IV fluids.   Leukocytosis: Improved from admission, though has now been on steroids. No fever, so will monitor off abx for now.  Tophaceous gout:  - Continue colchicine.  - DC prednisone as steroid use may be worsening paranoia/psychotic symptoms.   Generalized deconditioning, weakness:  - Continued rehabilitation recommended, though pt declining and per family would almost certainly not permit home health therapies. We will order this in hopes that they will be allowed to assist the patient.   Alcohol abuse and  withdrawal:  - Cessation counseling provided - On prn ativan, though symptoms of anxiety may be primary target of this. Now > 5 days into hospitalization, do not anticipate worsening withdrawal at this time.  - Continue high dose thiamine.  - Note U/S revealed hepatic steatosis without cirrhotic morphology. LFTs wnl.   Subjective: Pt wants me to remove a sign on his wall because he hates it. He will not tell me which sign or what it says, "if you don't know you're as blind as a bat." Also says he's not eating, not hungry, and that food is poisoned. Denies noting any bleeding. His only complaint is "this bedroom," but only further conversation he's willing to have in response to my questions after that is "just leave me alone."   Objective: Vitals:   03/25/22 0737 03/25/22 1505 03/25/22 1920 03/26/22 0825  BP: 131/83 132/89 136/84 134/83  Pulse: 89 87 84 88  Resp:   16   Temp: 98.2 F (36.8 C) (!) 97.5 F (36.4 C) 98.4 F (36.9 C) 98 F (36.7 C)  TempSrc:   Oral   SpO2: 93% 100% 100% 100%  Weight:      Height:       Exam is limited by pt cooperation.  Gen: 66 y.o. male in no distress Pulm: Nonlabored breathing room air CV: No JVD.   Skin: No rashes, lesions or ulcers on visualized skin. Neuro: Alert, MAEW, speech normal.    Data Personally reviewed: CBC: Recent Labs  Lab 03/20/22 1712 03/21/22 0118 03/21/22 0913 03/23/22 0454 03/24/22 0614 03/25/22 0504 03/26/22 0429  WBC 18.4* 15.5* 12.3* 13.9*  --   --  13.6*  NEUTROABS 15.5*  --   --   --   --   --   --   HGB 5.0* 6.7* 7.8* 7.6* 7.8* 7.8* 7.2*  HCT 17.7* 21.6* 24.5* 25.2*  --   --  23.8*  MCV 96.2 92.7 91.4 95.5  --   --  93.7  PLT 344 198 188 203  --   --  0000000   Basic Metabolic Panel: Recent Labs  Lab 03/21/22 0348 03/23/22 0454 03/24/22 0614 03/25/22 0504 03/26/22 0429  NA 139 145 145 145 139  K 4.5 3.6 3.5 4.0 3.9  CL 112* 112* 111 113* 107  CO2 22 23 23 25 24   GLUCOSE 135* 116* 113* 141* 132*  BUN  67* 26* 24* 41* 46*  CREATININE 1.63* 1.37* 1.39* 1.75* 1.58*  CALCIUM 8.2* 8.8* 8.9 8.9 8.5*  MG 2.2 2.1 2.3  --   --   PHOS 5.0* 4.3 3.9  --   --    GFR: Estimated Creatinine Clearance: 43.5 mL/min (A) (by C-G formula based on SCr of 1.58 mg/dL (H)). Liver Function Tests: Recent Labs  Lab 03/20/22 1712 03/21/22 0348 03/23/22 0454  AST 27 41 35  ALT 13 13 14   ALKPHOS 46 40 49  BILITOT 0.5 0.5 0.5  PROT 6.8 6.0* 6.7  ALBUMIN 3.3* 2.9* 3.0*   Recent Labs  Lab 03/20/22 1712  LIPASE 42   No results for input(s): "AMMONIA" in the last 168 hours. Coagulation Profile: Recent Labs  Lab 03/21/22 0118 03/21/22 0348 03/23/22 0454  INR 1.2 1.2 1.1   Cardiac Enzymes: No results for input(s): "CKTOTAL", "CKMB", "CKMBINDEX", "TROPONINI" in the last 168 hours. BNP (last 3 results) No results for input(s): "PROBNP" in the last 8760 hours. HbA1C: No results for input(s): "HGBA1C" in the last 72 hours. CBG: Recent Labs  Lab 03/20/22 2123  GLUCAP 112*   Lipid Profile: No results for input(s): "CHOL", "HDL", "LDLCALC", "TRIG", "CHOLHDL", "LDLDIRECT" in the last 72 hours. Thyroid Function Tests: No results for input(s): "TSH", "T4TOTAL", "FREET4", "T3FREE", "THYROIDAB" in the last 72 hours. Anemia Panel: No results for input(s): "VITAMINB12", "FOLATE", "FERRITIN", "TIBC", "IRON", "RETICCTPCT" in the last 72 hours. Urine analysis:    Component Value Date/Time   COLORURINE STRAW (A) 11/29/2020 0646   APPEARANCEUR CLEAR (A) 11/29/2020 0646   APPEARANCEUR Hazy 04/28/2012 1747   LABSPEC 1.014 11/29/2020 0646   LABSPEC 1.016 04/28/2012 1747   PHURINE 5.0 11/29/2020 0646   GLUCOSEU NEGATIVE 11/29/2020 0646   GLUCOSEU Negative 04/28/2012 1747   HGBUR NEGATIVE 11/29/2020 0646   [place] 11/29/2020 0646   BILIRUBINUR Negative 04/28/2012 1747   KETONESUR NEGATIVE 11/29/2020 0646   PROTEINUR NEGATIVE 11/29/2020 0646   NITRITE NEGATIVE 11/29/2020 0646   LEUKOCYTESUR  NEGATIVE 11/29/2020 0646   LEUKOCYTESUR Trace 04/28/2012 1747   Family Communication: None at bedside this AM  Disposition: Status is: Inpatient Remains inpatient appropriate because: Persistent AMS, paranoia, malnutrition not allowing discharge.  Planned Discharge Destination:  TBD  Patrecia Pour, MD 03/26/2022 10:03 AM Page by [email or web address]

## 2022-03-26 NOTE — TOC Initial Note (Addendum)
Transition of Care [facility]) - Initial/Assessment Note    Patient Details  Name: John Escobar MRN: [id number] Date of Birth: May 10, 1956  [facility] CM/SW Contact:    Maree Krabbe, LCSW Phone Number: 03/26/2022, 10:09 AM  Clinical Narrative:   CSW spoke with pt's son. Pt's son stating pt lives with him and his sister. Pt's son stating pt will refuse SNF and probably not work with [facility]. However, pt's son is agreeable to CSW setting up [facility]- with no agency preference.                 [facility] will service.  Expected Discharge Plan: Home w Home Health Services Barriers to Discharge: Continued Medical Work up   Patient Goals and CMS Choice Patient states their goals for this hospitalization and ongoing recovery are:: unable to states goals to lethargic   Choice offered to / list presented to : Adult Children  Expected Discharge Plan and Services Expected Discharge Plan: Home w Home Health Services In-house Referral: Clinical Social Work Discharge Planning Services: CM Consult   Living arrangements for the past 2 months: Single Family Home                           HH Arranged: PT, OT          Prior Living Arrangements/Services Living arrangements for the past 2 months: Single Family Home Lives with:: Adult Children Patient language and need for interpreter reviewed:: Yes        Need for Family Participation in Patient Care: Yes (Comment) Care giver support system in place?: Yes (comment)   Criminal Activity/Legal Involvement Pertinent to Current Situation/Hospitalization: No - Comment as needed  Activities of Daily Living      Permission Sought/Granted Permission sought to share information with : Family Supports Permission granted to share information with : Yes, Release of Information Signed  Share Information with NAME: Benita Gutter     Permission granted to share info w Relationship: son     Emotional Assessment Appearance:: Appears stated  age Attitude/Demeanor/Rapport: Unable to Assess Affect (typically observed): Unable to Assess Orientation: : Oriented to Self Alcohol / Substance Use: Not Applicable Psych Involvement: No (comment)  Admission diagnosis:  GIB (gastrointestinal bleeding) [K92.2] Acute GI bleeding [K92.2] Alcohol withdrawal syndrome without complication (HCC) [F10.930] Symptomatic anemia [D64.9] Patient Active Problem List   Diagnosis Date Noted   Acute blood loss anemia    Alcohol withdrawal syndrome without complication (HCC)    Tophaceous gout 03/23/2022   Failure to thrive in adult 03/23/2022   Malnutrition of moderate degree 03/22/2022   Acute GI bleeding    GIB (gastrointestinal bleeding) 03/20/2022   Acute on chronic combined systolic and diastolic CHF (congestive heart failure) (HCC) 08/02/2021   Acute HFrEF (heart failure with reduced ejection fraction) (HCC)    Symptomatic anemia 07/31/2021   Hyponatremia 07/31/2021   Acute kidney injury superimposed on CKD (HCC) 07/31/2021   Troponin I above reference range 07/31/2021   Acute anemia 11/28/2020   Weakness 11/28/2020   Elevated serum creatinine 11/28/2020   COVID-19 virus infection 11/28/2020   Lactic acidosis 11/28/2020   Severe sepsis (HCC) 11/28/2020   PCP:  Oneita Hurt No Pharmacy:   [facility] [address]), [place] - [address] [place]) [place] [address] Phone: [phone] Fax: [phone]     Social Determinants of Health (SDOH) Interventions    Readmission Risk Interventions  No data to display

## 2022-03-26 NOTE — Progress Notes (Signed)
                                                     Palliative Care Progress Note, Assessment & Plan   Patient Name: John Escobar       Date: 03/26/2022 DOB: [birth year]  Age: 66 y.o. MRN#: [id number] Attending Physician: Patrecia Pour, MD Primary Care Physician: Pcp, No Admit Date: 03/20/2022  Reason for Consultation/Follow-up: Establishing goals of care  Subjective: Patient is lying in bed in no apparent distress.  He is very sleepy but arouses to loud verbal stimuli.  Once awake, patient tell me to go away several times.  When asked if he was hungry patient said no.  No family at bedside presently.  HPI: 66 y.o. male  with past medical history of EtOH disorder, gastritis, PUD, dilated cardiomyopathy, CKD, gout, and angiectasia admitted on 03/20/2022 with upper GI bleed secondary to 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  Patient has refused endoscopy.  Patient is not eating and drinking.  PMT was consulted to discuss failure to thrive and help clarify goals of care.   Summary of counseling/coordination of care: After reviewing the patient's chart and assessing the patient at bedside, patient continues to refuse p.o. intake.  Caloric intake for last 48 hours revealed patient has moderate malnutrition with minimal intake of needed daily calories.  Despite encouraging Ensure, cookies, banana, and other fluids patient continues to not meet daily caloric requirements.  To other members of the medical team patient made comments that he believes his food is being poisoned.  Psych consult is pending.  PMT will continue to follow the patient throughout his hospitalization.  Family plans to visit once they have the funds to get transportation-likely tomorrow or Friday.  Code Status: Full code  Prognosis: < 6 weeks  Discharge Planning: To Be  Determined  Recommendations/Plan: Continue encouraging p.o. intake Ongoing conversations with family in regards to end-of-life/hospice care if patient continues to refuse p.o. intake  Physical Exam Vitals and nursing note reviewed.  Constitutional:      Appearance: Normal appearance.  HENT:     Head: Normocephalic and atraumatic.     Mouth/Throat:     Comments: Poor dentition, missing teeth Cardiovascular:     Rate and Rhythm: Normal rate.     Pulses: Normal pulses.  Pulmonary:     Effort: Pulmonary effort is normal.  Abdominal:     Palpations: Abdomen is soft.  Musculoskeletal:     Comments: Generalized weakness  Skin:    General: Skin is warm and dry.             Palliative Assessment/Data: 40%    Total Time 25 minutes  Greater than 50%  of this time was spent counseling and coordinating care related to the above assessment and plan.  Thank you for allowing the Palliative Medicine Team to assist in the care of this patient.  [place] Ilsa Iha, FNP-BC Palliative Medicine Team Team Phone # [phone]

## 2022-03-26 NOTE — Progress Notes (Signed)
Calorie Count Note  48 hour calorie count ordered.  Diet: regular Supplements: Ensure Enlive po TID, each supplement provides 350 kcal and 20 grams of protein; 30 ml Prosource Plus TID, each supplement provides 100 kcals and 15 grams protein  Pt sleeping soundly at time of visit and did not respond to name being called. He is mostly refusing meals and supplements  Palliative care following for goals of care. Plan for psych consult.   6/12-6/13 Breakfast: 0% meal completion (pt refused) Lunch: 175 kcals and 10 grams protein (refused meal and drank 50% of Ensure) Dinner: 175 kcals, 10 grams protein (refused meal and drank 50% of Ensure)  Total intake: 350 kcal (18% of minimum estimated needs)  20 grams protein (21% of minimum estimated needs)  6/13-6/14 Breakfast: 0% meal completion Lunch: 0% meal completion Dinner: 0% meal completion  Supplements (one Prosource Plus supplement)  Total intake: 100 kcal (5% of minimum estimated needs)  15 grams protein (16% of minimum estimated needs)  Nutrition Dx: Moderate Malnutrition related to social / environmental circumstances (ETOH abuse) as evidenced by mild fat depletion, moderate muscle depletion; ongoing  Goal: Patient will meet greater than or equal to 90% of their needs; unmet  Intervention:   -Continue Ensure Enlive po TID, each supplement provides 350 kcal and 20 grams of protein -Continue 30 ml Prosource Plus TID, each supplement provides 100 kcals and 15 grams protein -Continue MVI with minerals daily -D/c calorie count  Levada Schilling, RD, LDN, CDCES Registered Dietitian II Certified Diabetes Care and Education Specialist Please refer to [facility] for RD and/or RD on-call/weekend/after hours pager

## 2022-03-26 NOTE — Telephone Encounter (Signed)
Spoke with Adelina Mings with [facility] home health. She inquired if we would be willing to do his home health orders for PT services. We have not seen this patient in clinic nor was he seen by our service during his most recent hospital visit. Advised that we will not be able to sign any orders for their service. She verbalized understanding with no further questions.

## 2022-03-26 NOTE — Progress Notes (Signed)
OT Cancellation Note  Patient Details Name: John Escobar MRN: [id number] DOB: 04-22-1956   Cancelled Treatment:    Reason Eval/Treat Not Completed: Patient declined. Attempted to see John Escobar for OT this AM, but he again refused services and stated repeatedly and unequivocally that he did not want me or any other therapists in the room, that he did not trust [place], and that he did not want to be here. I politely informed him that we would follow his request and take him off our list. Should additional needs arise, or should pt change his mind and want assistance with his functional mobility needs, please issue a new order for rehab services.   Latina Craver, PhD, MS, OTR/L 03/26/22, 12:36 PM

## 2022-03-26 NOTE — Progress Notes (Signed)
PT Cancellation Note  Patient Details Name: WING SCHOCH MRN: [id number] DOB: 03-05-1956   Cancelled Treatment:    Reason Eval/Treat Not Completed: Patient not medically ready (Pt continues to have several barriers to advancing his rehabilitative goals. As his POC continues to be established, PT will signoff and await new orders when/if appropriate.) PT signing off at this time.    Laniyah Rosenwald C 03/26/2022, 9:22 AM

## 2022-03-26 NOTE — Telephone Encounter (Signed)
Calling to see if the dr will do his home health ordered. Please advise

## 2022-03-26 NOTE — Consult Note (Signed)
[facility]iat[facility] Consult   Reason for Consult:  Not eating Referring Physician:  Wieting Patient Identification: John Escobar MRN:  [id number] Principal Diagnosis: Acute blood loss anemia Diagnosis:  Principal Problem:   Acute blood loss anemia Active Problems:   Weakness   Acute kidney injury superimposed on CKD ([place])   GIB (gastrointestinal bleeding)   Acute GI bleeding   Malnutrition of moderate degree   Tophaceous gout   Failure to thrive in adult   Alcohol withdrawal syndrome without complication ([place])   Total Time spent with patient: 45 minutes  Subjective:   John Escobar is a 66 y.o. male patient admitted with GI bleed on 6/8.  HPI:  Patient has a history of chronic kidney disease, alcohol abuse, dilated cardiomyopathy, iron deficiency anemia, and other diagnoses as noted above. Psychiatry consulted because patient has been refusing to eat, and drinks very little. Question if he is paranoid about being poisoned here.   On evaluation, patient is very irritable, but alert and oriented. He repeats several times to writer "Go to hell, you B----, you need to be 6 feet under. You are evil. You are all liars. You are trying to poison me. Get the hell out of my room." Writer told patient that we are here to help him get home and the best thing he could do would be to try to eat and drink a little more and to tell us what is causing him to not want to do those things so that we can help him. Shared with him that everyone's goal is for him to go home. With gentle persistence, patient was able to report that he is mad at his kids because "they brought me here."  He reports that he is going to call them and tell them to be out of his house when he gets home. He wants to go home. Again, Probation officer reminded him that we hope he will try to get in more fluids in, at least. Writer asked patient if we might try some medication that could help his thoughts surrounding eating. Patient  refuses, telling Probation officer to "get the hell out of my room." Patient is not physically aggressive.   Collateral from son, John Escobar, [phone]: Son states that patient is generally not a happy person, is "cantankerous."  In regard to patient refusing foods, son reports that he can't get out of him why, either. He does say that patient sometimes hears or reads about things that are a "little out there" and believes them, but he has never seen patient experiencing psychosis. He reports that patient "is an alcoholic" and gets angry when we try to discourage him drinking."   Son reports that he and his 2 siblings will be coming to visit patient in the hospital on Friday (6/16). They hope to get more clarification on plan for palliative care.   Past Psychiatric History: alcohol abuse per chart review.  Risk to Self:   Risk to Others:   Prior Inpatient Therapy:   Prior Outpatient Therapy:    Past Medical History:  Past Medical History:  Diagnosis Date   CKD (chronic kidney disease), stage III ([place])    COVID-19 virus infection w/ Sepsis 11/2020   Dilated cardiomyopathy ([place])    a. 07/2021 Echo: EF 20-25%, GrII DD. Low-nl RV fxn. Nl RVSP.  Mildly dil LA. Mod MR/TR.   ETOH abuse    Gastritis    HFrEF (heart failure with reduced ejection fraction) ([place])  a. 07/2021 Echo: EF 20-25%.   Hiatal hernia    Iron deficiency anemia    PUD (peptic ulcer disease)     Past Surgical History:  Procedure Laterality Date   ELBOW BURSA SURGERY     ESOPHAGOGASTRODUODENOSCOPY (EGD) WITH PROPOFOL N/A 11/29/2020   Procedure: ESOPHAGOGASTRODUODENOSCOPY (EGD) WITH PROPOFOL;  Surgeon: Toledo, Benay Pike, MD;  Location: [facility] ENDOSCOPY;  Service: Gastroenterology;  Laterality: N/A;   SHOULDER SURGERY Left    Family History: History reviewed. No pertinent family history. Family Psychiatric  History: unknown Social History:  Social History   Substance and Sexual Activity  Alcohol Use Yes   Comment: says he  prev drank heavily but more moderate since early 1980's.  Currently drinking ~ 6 oz/beer/day.     Social History   Substance and Sexual Activity  Drug Use Not Currently    Social History   Socioeconomic History   Marital status: Single    Spouse name: Not on file   Number of children: Not on file   Years of education: Not on file   Highest education level: Not on file  Occupational History   Not on file  Tobacco Use   Smoking status: Never   Smokeless tobacco: Never  Substance and Sexual Activity   Alcohol use: Yes    Comment: says he prev drank heavily but more moderate since early 1980's.  Currently drinking ~ 6 oz/beer/day.   Drug use: Not Currently   Sexual activity: Not on file  Other Topics Concern   Not on file  Social History Narrative   Lives in [place] w/ his three sons.   Social Determinants of Health   Financial Resource Strain: Not on file  Food Insecurity: Not on file  Transportation Needs: Not on file  Physical Activity: Not on file  Stress: Not on file  Social Connections: Not on file   Additional Social History:    Allergies:  No Known Allergies  Labs:  Results for orders placed or performed during the hospital encounter of 03/20/22 (from the past 48 hour(s))  Hemoglobin     Status: Abnormal   Collection Time: 03/25/22  5:04 AM  Result Value Ref Range   Hemoglobin 7.8 (L) 13.0 - 17.0 g/dL    Comment: Performed at [facility], [address] [place], [place] [id number]  Basic metabolic panel     Status: Abnormal   Collection Time: 03/25/22  5:04 AM  Result Value Ref Range   Sodium 145 135 - 145 mmol/L   Potassium 4.0 3.5 - 5.1 mmol/L   Chloride 113 (H) 98 - 111 mmol/L   CO2 25 22 - 32 mmol/L   Glucose, Bld 141 (H) 70 - 99 mg/dL    Comment: Glucose reference range applies only to samples taken after fasting for at least 8 hours.   BUN 41 (H) 8 - 23 mg/dL   Creatinine, Ser 1.75 (H) 0.61 - 1.24 mg/dL   Calcium 8.9 8.9 - 10.3  mg/dL   GFR, Estimated 43 (L) >60 mL/min    Comment: (NOTE) Calculated using the CKD-EPI Creatinine Equation (2021)    Anion gap 7 5 - 15    Comment: Performed at [facility], [address] [place], [place] [address]  CBC     Status: Abnormal   Collection Time: 03/26/22  4:29 AM  Result Value Ref Range   WBC 13.6 (H) 4.0 - 10.5 K/uL   RBC 2.54 (L) 4.22 - 5.81 MIL/uL   Hemoglobin  7.2 (L) 13.0 - 17.0 g/dL   HCT 21.9 (L) 75.8 - 83.2 %   MCV 93.7 80.0 - 100.0 fL   MCH 28.3 26.0 - 34.0 pg   MCHC 30.3 30.0 - 36.0 g/dL   RDW 54.9 82.6 - 41.5 %   Platelets 254 150 - 400 K/uL   nRBC 0.0 0.0 - 0.2 %    Comment: Performed at [facility], [address] [place], [place] [address]  Basic metabolic panel     Status: Abnormal   Collection Time: 03/26/22  4:29 AM  Result Value Ref Range   Sodium 139 135 - 145 mmol/L   Potassium 3.9 3.5 - 5.1 mmol/L   Chloride 107 98 - 111 mmol/L   CO2 24 22 - 32 mmol/L   Glucose, Bld 132 (H) 70 - 99 mg/dL    Comment: Glucose reference range applies only to samples taken after fasting for at least 8 hours.   BUN 46 (H) 8 - 23 mg/dL   Creatinine, Ser 0.76 (H) 0.61 - 1.24 mg/dL   Calcium 8.5 (L) 8.9 - 10.3 mg/dL   GFR, Estimated 48 (L) >60 mL/min    Comment: (NOTE) Calculated using the CKD-EPI Creatinine Equation (2021)    Anion gap 8 5 - 15    Comment: Performed at [facility], [address] [place], [place] [address]    Current Facility-Administered Medications  Medication Dose Route Frequency Provider Last Rate Last Admin   (feeding supplement) PROSource Plus liquid 30 mL  30 mL Oral TID BM Alford Highland, MD   30 mL at 03/25/22 2227   ascorbic acid (VITAMIN C) tablet 500 mg  500 mg Oral BID Alford Highland, MD   500 mg at 03/26/22 0801   Chlorhexidine Gluconate Cloth 2 % PADS 6 each  6 each Topical Daily Alford Highland, MD   6 each at 03/26/22 0801   colchicine tablet 0.3 mg  0.3 mg Oral Daily Wieting,  Richard, MD   0.3 mg at 03/26/22 1236   docusate sodium (COLACE) capsule 100 mg  100 mg Oral BID PRN Lorin Glass, MD       feeding supplement (ENSURE ENLIVE / ENSURE PLUS) liquid 237 mL  237 mL Oral TID BM Erin Fulling, MD   237 mL at 03/25/22 0849   folic acid injection 1 mg  1 mg Intravenous Daily Tressie Ellis, RPH   1 mg at 03/26/22 0801   lactated ringers infusion   Intravenous Continuous Tyrone Nine, MD       LORazepam (ATIVAN) injection 1-2 mg  1-2 mg Intravenous Q1H PRN Erin Fulling, MD   2 mg at 03/22/22 1739   metoprolol succinate (TOPROL-XL) 24 hr tablet 25 mg  25 mg Oral Daily Alford Highland, MD   25 mg at 03/26/22 1031   multivitamin with minerals tablet 1 tablet  1 tablet Oral Daily Erin Fulling, MD   1 tablet at 03/26/22 0801   ondansetron (ZOFRAN) injection 4 mg  4 mg Intravenous Q6H PRN Lorin Glass, MD   4 mg at 03/23/22 1051   oxyCODONE (Oxy IR/ROXICODONE) immediate release tablet 5 mg  5 mg Oral Q6H PRN Alford Highland, MD       pantoprazole (PROTONIX) injection 40 mg  40 mg Intravenous Q12H Alford Highland, MD   40 mg at 03/26/22 0801   polyethylene glycol (MIRALAX / GLYCOLAX) packet 17 g  17 g Oral Daily PRN Lorin Glass, MD  Musculoskeletal: Strength & Muscle Tone: decreased, on observation Gait & Station:  bedbound Patient leans: N/A   Psychiatric Specialty Exam:  Presentation  General Appearance: Disheveled  Eye Contact:Good  Speech:Clear and Coherent  Speech Volume:Normal  Handedness:No data recorded  Mood and Affect  Mood:Dysphoric; Irritable  Affect:Blunt; Congruent   Thought Process  Thought Processes:Coherent  Descriptions of Associations:Intact  Orientation:Full (Time, Place and Person)  Thought Content:Perseveration  History of Schizophrenia/Schizoaffective disorder:No data recorded Duration of Psychotic Symptoms:No data recorded Hallucinations:Hallucinations: None  Ideas of Reference:None  Suicidal  Thoughts:Suicidal Thoughts: -- (DID NOT EXPRESS)  Homicidal Thoughts:Homicidal Thoughts: -- (DID NOT EXPRESS)   Sensorium  Memory:Immediate Fair (UTA)  Judgment:Poor  Insight:No data recorded  Executive Functions  Concentration:Fair  Attention Span:Fair  [place]   Psychomotor Activity  Psychomotor Activity:Psychomotor Activity: -- (bedbound)   Assets  Assets:Resilience; Social Support   Sleep  Sleep:Sleep: Fair   Physical Exam: Physical Exam Vitals and nursing note reviewed.  HENT:     Head: Normocephalic.     Nose: No congestion or rhinorrhea.  Eyes:     General:        Right eye: No discharge.        Left eye: No discharge.  Cardiovascular:     Rate and Rhythm: Normal rate.  Pulmonary:     Effort: Pulmonary effort is normal.  Musculoskeletal:     Cervical back: Normal range of motion.     Comments: Lying in bed   Skin:    General: Skin is dry.  Neurological:     Mental Status: He is alert and oriented to person, place, and time.  Psychiatric:        Attention and Perception: Attention normal.        Mood and Affect: Affect is blunt and angry.        Speech: Speech normal.        Behavior: Behavior is uncooperative.        Thought Content: Thought content is delusional (possibly).        Cognition and Memory: Cognition is impaired.        Judgment: Judgment is impulsive.    Review of Systems  Reason unable to perform ROS: Pt uncooperative. Did not fully assess.  Psychiatric/Behavioral:  Positive for substance abuse.    Blood pressure (!) 138/94, pulse 85, temperature 98.4 F (36.9 C), resp. rate 16, height 5\' 9"  (1.753 m), weight 66 kg, SpO2 99 %. Body mass index is 21.49 kg/m.  Treatment Plan Summary:Unable to recommend any medications at this time, as patient refuses and unclear whether or not he is having delusions. He is not aggressive. We can consider medications if it becomes more clear what  symptoms we are attempting to target if patient becomes more agreeable to talk to [place].  Can also consider Remeron for some depression and may increase his appetite if he is willing.      Disposition: No evidence of imminent risk to self or others at present.   Patient does not meet criteria for psychiatric inpatient admission. Supportive therapy provided about ongoing stressors.  Sherlon Handing, NP 03/26/2022 5:35 PM

## 2022-03-27 DIAGNOSIS — F10930 Alcohol use, unspecified with withdrawal, uncomplicated: Secondary | ICD-10-CM | POA: Diagnosis not present

## 2022-03-27 DIAGNOSIS — K2971 Gastritis, unspecified, with bleeding: Secondary | ICD-10-CM | POA: Diagnosis not present

## 2022-03-27 DIAGNOSIS — K922 Gastrointestinal hemorrhage, unspecified: Secondary | ICD-10-CM | POA: Diagnosis not present

## 2022-03-27 DIAGNOSIS — R627 Adult failure to thrive: Secondary | ICD-10-CM | POA: Diagnosis not present

## 2022-03-27 DIAGNOSIS — D62 Acute posthemorrhagic anemia: Secondary | ICD-10-CM | POA: Diagnosis not present

## 2022-03-27 NOTE — Plan of Care (Signed)

## 2022-03-27 NOTE — Care Management Important Message (Signed)
Important Message  Patient Details  Name: John Escobar MRN: [id number] Date of Birth: May 10, 1956   Medicare Important Message Given:  Yes  Patient asleep upon time of visit.  Copy of Medicare IM left in room on counter for reference.  Reviewed Medicare IM right to appeal discharge with [name], son, at [phone].    [name] 03/27/2022, 2:22 PM

## 2022-03-27 NOTE — Progress Notes (Addendum)
Pt is refusing CBC and BMP at 2015 but was educated about its importance. NP Morrision made aware. Will continue to monitor.  Upadte 2040: No new order place. Will continue to monitor.

## 2022-03-27 NOTE — Progress Notes (Signed)
Progress Note  Patient: John Escobar ENI:[id number] DOB: 09/30/1956  DOA: 03/20/2022  DOS: 03/27/2022    Brief hospital course: CORIAN HANDLEY is a 66 y.o. male with a history of stage IIIa CKD, alcohol abuse, dilated cardiomyopathy, iron deficiency anemia and GI bleeding due to PUD and gastrointestinal AVMs who was admitted 6/8 due to acute symptomatic anemia due to coffee-ground emesis. Hemoglobin improved from 5 to 7.8 after 3u PRBCs, and the patient has declined endoscopic evaluation. Hospitalization complicated by paranoia and refusal of care for which psychiatry and palliative care have been consulted.  Assessment and Plan: Acute blood loss anemia due to suspected GI bleeding: Hx gastric ulcer and AVMs, both possible culprits. - s/p 3u PRBCs.  - s/p IV iron - Monitor CBC, hgb down to 7.2 (pt declined labs, would likely decline further transfusion at this point as well, VSS and no active bleeding noted) - Pt declining endoscopic evaluations. Will continue PPI empirically  Failure to thrive, moderate protein calorie malnutrition: Calorie count confirms patient is only meeting about 1/3 of his nutritional needs. This is complicated by paranoia which may improve with medications and/or discharge home. His prognosis on this course is quite guarded.  - Palliative care consulted to elicit goals of care. Plans to meet with family 6/16.   AKI on stage IIIa CKD: SCr 1.8 on arrival, improved to putative baseline of 1.3.  - Monitor renal function regularly if pt allows (refused today), given poor oral intake and no evidence of volume overload, will continue balanced crystalloid infusion.   Leukocytosis: Improved from admission, though has now been on steroids. No fever, so will monitor off abx for now. Refusing serial labs.  Tophaceous gout:  - Continue colchicine.  - DC'ed prednisone as steroid use may be worsening paranoia/psychotic symptoms. No pain reported.  Generalized deconditioning,  weakness:  - Continued rehabilitation recommended, though pt declining and per family would almost certainly not permit home health therapies. We have ordered this. Family will see patient tomorrow. Staff suspect his current functional status may not be conducive to discharge home.    Alcohol abuse and withdrawal:  - Cessation counseling provided - On prn ativan, though symptoms of anxiety may be primary target of this. Now > 5 days into hospitalization, do not anticipate worsening withdrawal at this time.  - Continue high dose thiamine.  - Note U/S revealed hepatic steatosis without cirrhotic morphology. LFTs wnl.   Subjective: Again refuses to speak with me. Wants to go home, but hasn't had anything to eat or drink in days. Says the water is too warm, and also states he can't drink water with ice in it. Continues to state the food is poisoned. Also says he's not hungry.   Objective: Vitals:   03/26/22 1541 03/26/22 1939 03/27/22 0426 03/27/22 0753  BP: (!) 138/94 140/88 (!) 142/87 130/83  Pulse: 85 86 92 88  Resp:  18 18 18   Temp: 98.4 F (36.9 C) 98.1 F (36.7 C) 98.7 F (37.1 C) 99.2 F (37.3 C)  TempSrc:      SpO2: 99% 100% 100% 99%  Weight:   65.9 kg   Height:       Exam is again limited by pt cooperation.  Resting quietly in no distress Pulm: Nonlabored on room air CV: No JVD or evidence of distal edema Skin: No visualized rashes on limited exam Neuro: Moves all extremities, speech is fluent Psych: Irritable, uncooperative, not violent.  Data Personally reviewed: CBC: Recent Labs  Lab  03/20/22 1712 03/21/22 0118 03/21/22 0913 03/23/22 0454 03/24/22 1937 03/25/22 0504 03/26/22 0429  WBC 18.4* 15.5* 12.3* 13.9*  --   --  13.6*  NEUTROABS 15.5*  --   --   --   --   --   --   HGB 5.0* 6.7* 7.8* 7.6* 7.8* 7.8* 7.2*  HCT 17.7* 21.6* 24.5* 25.2*  --   --  23.8*  MCV 96.2 92.7 91.4 95.5  --   --  93.7  PLT 344 198 188 203  --   --  254   Basic Metabolic  Panel: Recent Labs  Lab 03/21/22 0348 03/23/22 0454 03/24/22 0614 03/25/22 0504 03/26/22 0429  NA 139 145 145 145 139  K 4.5 3.6 3.5 4.0 3.9  CL 112* 112* 111 113* 107  CO2 22 23 23 25 24   GLUCOSE 135* 116* 113* 141* 132*  BUN 67* 26* 24* 41* 46*  CREATININE 1.63* 1.37* 1.39* 1.75* 1.58*  CALCIUM 8.2* 8.8* 8.9 8.9 8.5*  MG 2.2 2.1 2.3  --   --   PHOS 5.0* 4.3 3.9  --   --    GFR: Estimated Creatinine Clearance: 43.4 mL/min (A) (by C-G formula based on SCr of 1.58 mg/dL (H)). Liver Function Tests: Recent Labs  Lab 03/20/22 1712 03/21/22 0348 03/23/22 0454  AST 27 41 35  ALT 13 13 14   ALKPHOS 46 40 49  BILITOT 0.5 0.5 0.5  PROT 6.8 6.0* 6.7  ALBUMIN 3.3* 2.9* 3.0*   Recent Labs  Lab 03/20/22 1712  LIPASE 42   Coagulation Profile: Recent Labs  Lab 03/21/22 0118 03/21/22 0348 03/23/22 0454  INR 1.2 1.2 1.1   Family Communication: None at bedside this AM  Disposition: Status is: Inpatient Remains inpatient appropriate because: Persistent AMS, paranoia, malnutrition not allowing discharge.  Planned Discharge Destination:  TBD  05/21/22, MD 03/27/2022 12:44 PM Page by [email or web address]

## 2022-03-27 NOTE — Progress Notes (Signed)
                                                     Palliative Care Progress Note   Patient Name: John Escobar       Date: 03/27/2022 DOB: 10-09-1956  Age: 66 y.o. MRN#: [id number] Attending Physician: Tyrone Nine, MD Primary Care Physician: Pcp, No Admit Date: 03/20/2022  After reviewing the patient's chart, I assess the patient at bedside.    PE: Patient refused allowing me to perform a physical assessment.  He repeatedly said no to offered food and drink.  He continued to repeat "get out".  He would not open his eyes or engage in conversations with me.  Respirations are even and unlabored.  Skin is warm and dry.  Breakfast tray is on bedside table and appears to be untouched.  No family at bedside presently.  After assessing the patient, I spoke with patient's dayshift nurse Edward Jolly.  Patient continues to refuse medications, food, and most beverages for RN as well. RN reports pt ate a sandwich and allowed night shift CNA to give him a bath.   I shared with nurse that family intends to visit tomorrow.  Ongoing conversations and further education needed for family to understand the gravity of patient not taking in p.o. for the past 6 days.  Family is hopeful that the presence will encourage patient to eat.    PMT will continue to follow the patient throughout his hospitalization.  I am off service tomorrow but will ask one of my colleagues to follow-up with the family for palliative needs.  Family has PMT contact information and was encouraged to call with any palliative questions or concerns.  Thank you for allowing the Palliative Medicine Team to assist in the care of John Escobar.  Total Time 25 minutes  Greater than 50%  of this time was spent counseling and coordinating care related to the above assessment and plan.  Samara Deist L. Manon Hilding,  FNP-BC Palliative Medicine Team Team Phone # [phone]

## 2022-03-28 DIAGNOSIS — Z7189 Other specified counseling: Secondary | ICD-10-CM | POA: Diagnosis not present

## 2022-03-28 DIAGNOSIS — D62 Acute posthemorrhagic anemia: Secondary | ICD-10-CM | POA: Diagnosis not present

## 2022-03-28 DIAGNOSIS — N179 Acute kidney failure, unspecified: Secondary | ICD-10-CM | POA: Diagnosis not present

## 2022-03-28 DIAGNOSIS — K922 Gastrointestinal hemorrhage, unspecified: Secondary | ICD-10-CM | POA: Diagnosis not present

## 2022-03-28 DIAGNOSIS — F10930 Alcohol use, unspecified with withdrawal, uncomplicated: Secondary | ICD-10-CM | POA: Diagnosis not present

## 2022-03-28 MED ORDER — PANTOPRAZOLE SODIUM 40 MG PO TBEC: 40.0000 mg | DELAYED_RELEASE_TABLET | Freq: Two times a day (BID) | ORAL | 0 refills | Status: DC

## 2022-03-28 MED ORDER — POLYSACCHARIDE IRON COMPLEX 150 MG PO CAPS: 150.0000 mg | ORAL_CAPSULE | Freq: Every day | ORAL | 0 refills | Status: AC
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
## 2022-03-28 NOTE — Progress Notes (Addendum)
Daily Progress Note   Patient Name: John Escobar       Date: 03/28/2022 DOB: 1956-02-14  Age: 66 y.o. MRN#: [id number] Attending Physician: Tyrone Nine, MD Primary Care Physician: Pcp, No Admit Date: 03/20/2022  Reason for Consultation/Follow-up: Establishing goals of care  Subjective: Notes reviewed. In to speak with patient. No family at bedside. He is talkative. He discusses that he lives at home with his 3 children. He states the youngest is 3. He tells me they moved in with him when he and his exwife divorced.   With conversation, he states at home he washes dishes and does things around the house. He uses a rolling walker. He states his apartment is not good, but he continues to rent there because it is the only place he can afford where his children can still get to work. He states they have not been here to the hospital because they cannot afford to. He states they will not be coming today because they both work, and can't afford not to be at work.   He tells me he does not trust this hospital. He states he trusts the campuses in [place] even less. He states if he had his way he would be at [facility]. He states if he could, he would live in [facility], but again cannot because he has to keep a home for his children. He states if he were at [facility] he would allow whatever care indicated. He states he would also allow care at [facility]. He states [facility] is okay, but he doesn't like the ED there.   He tells me in regards to GOC, his family historically has been DNR, but he is unsure of how he feels about this personally, as he states God is in control of this not him or any other human; he understands he can determine if we attempt resuscitation or not.  He states he will also  have to consider thoughts on ventilator support if that were ever indicated. He discusses that his children would make decisions if he could not.   He tells me he will not accept a feeding tube. He states he did not have an issue with eating enough prior to admission and will not when he goes home. He states he understands he is receiving  IV fluids for hydration. He states is hungry but hates the food here, and we do not have what he wants to drink. He tells me once he is discharged, he will eat and drink as normal.   Nursing comes in to provide pain medication and other routine meds. He declined the colchicine as he states it gives him horrible diarrhea.    He tells me he has not had any further signs of GIB. He states if he has further medical problems he will call a cab to take him to [facility], and if he cannot do that, he will call 911.   He states he really needs to be discharged today as his lease is up on his apartment and the forms need to be re-signed today. He states if they are not, he and his children will be homeless 7 days from now.       He is amenable to Palliative to follow outpatient if he does not have to pay out of pocket.    Spoke with son. He states they do work, but were coming in to see him. They state they will honor his wishes whatever they are.    ADDENDUM::: Spoke with attending and RN on unit at length. Poor mobility with mobility aid. In to speak with patient. His 3 children are present. We discussed his status. We also discussed mobility and diet. They talk amongst themselves. They discuss the lease signing. We discussed the assistance he will need at home. The children state they saw him stand with staff and do not feel they will be able to offer the support needed.   Patient is kind, but very clear he is going home today no matter what and wants no further care provided here . He states he does not want to return here, but if he does, he would not want  ventilator support or CPR. He states he would like to leave AMA if needed and states he will go elsewhere or call 911 if needed in the future. Primary team, TOC, and RN who is also charge RN today made aware.    Length of Stay: 8  Current Medications: Scheduled Meds:   (feeding supplement) PROSource Plus  30 mL Oral TID BM   vitamin C  500 mg Oral BID   Chlorhexidine Gluconate Cloth  6 each Topical Daily   colchicine  0.3 mg Oral Daily   feeding supplement  237 mL Oral TID BM   folic acid  1 mg Intravenous Daily   metoprolol succinate  25 mg Oral Daily   multivitamin with minerals  1 tablet Oral Daily   pantoprazole  40 mg Intravenous Q12H    Continuous Infusions:  lactated ringers 100 mL/hr at 03/28/22 0352    PRN Meds: docusate sodium, LORazepam, ondansetron (ZOFRAN) IV, oxyCODONE, polyethylene glycol  Physical Exam Pulmonary:     Effort: Pulmonary effort is normal.  Neurological:     Mental Status: He is alert.             Vital Signs: BP (!) 146/92 (BP Location: Right Arm)   Pulse 92   Temp 98.1 F (36.7 C)   Resp 18   Ht 5\' 9"  (1.753 m)   Wt 67 kg   SpO2 99%   BMI 21.81 kg/m  SpO2: SpO2: 99 % O2 Device: O2 Device: Room Air O2 Flow Rate:    Intake/output summary:  Intake/Output Summary (Last 24 hours) at 03/28/2022 1109 Last data filed at  03/28/2022 0412 Gross per 24 hour  Intake 1928.62 ml  Output --  Net 1928.62 ml   LBM: Last BM Date : 03/25/22 Baseline Weight: Weight: 63.5 kg Most recent weight: Weight: 67 kg   Patient Active Problem List   Diagnosis Date Noted   Acute blood loss anemia    Alcohol withdrawal syndrome without complication (HCC)    Tophaceous gout 03/23/2022   Failure to thrive in adult 03/23/2022   Malnutrition of moderate degree 03/22/2022   Acute GI bleeding    GIB (gastrointestinal bleeding) 03/20/2022   Acute on chronic combined systolic and diastolic CHF (congestive heart failure) (HCC) 08/02/2021   Acute HFrEF (heart  failure with reduced ejection fraction) (HCC)    Symptomatic anemia 07/31/2021   Hyponatremia 07/31/2021   Acute kidney injury superimposed on CKD (HCC) 07/31/2021   Troponin I above reference range 07/31/2021   Acute anemia 11/28/2020   Weakness 11/28/2020   Elevated serum creatinine 11/28/2020   COVID-19 virus infection 11/28/2020   Lactic acidosis 11/28/2020   Severe sepsis (HCC) 11/28/2020    Palliative Care Assessment & Plan     Recommendations/Plan:  Patient is amenable to outpatient palliative as long as he has no out of pocket expense for it.  He will consider code status and thoughts on vent support. He states this is all really up to God no matter what care is provided.  He will not allow a feeding tube as he tells me he is not eating because he does not like the food or beverages here, and states he will eat and drink once he gets home.   He wants to be discharged today as his lease is up, and does not want he and his children to be homeless.   He states his children will not be here today as they have to work.   ADDENDUM::: 3 children are at bedside. Long conversation. Patient wants to go home now even if AMA.     Code Status:    Code Status Orders  (From admission, onward)           Start     Ordered   03/20/22 2034  Full code  Continuous        03/20/22 2034           Code Status History     Date Active Date Inactive Code Status Order ID Comments User Context   03/20/2022 1757 03/20/2022 2034 Full Code [id number]  Willy Eddy, MD ED   08/01/2021 0141 08/06/2021 2229 Full Code [id number]  Rometta Emery, MD Inpatient   11/28/2020 2102 12/04/2020 1938 Full Code [id number]  Howerter, Chaney Born, DO Inpatient        Care plan was discussed with RN  Thank you for allowing the Palliative Medicine Team to assist in the care of this patient.   Morton Stall, NP  Please contact Palliative Medicine Team phone at [phone] for questions and  concerns.

## 2022-03-28 NOTE — Progress Notes (Signed)
Patient left unit AMA @1600  via wheelchair with family accompanying patient downstairs while waiting for cab to transport them home.

## 2022-03-28 NOTE — Progress Notes (Addendum)
Mobility Specialist - Progress Note    03/28/22 1400  Mobility  Activity Stood at bedside;Dangled on edge of bed  Level of Assistance Minimal assist, patient does 75% or more  Assistive Device Front wheel walker  Distance Ambulated (ft) 0 ft  Activity Response Tolerated well  $Mobility charge 1 Mobility      Pt supine upon arrival using RA. Completes bed mobility ModI + extra time, voices neck pain. Dangles EOB indep + vc for feet placement before standing. STS MinA -- vc to for to lift UE. Stands ~ 30 seconds MinA and takes one step forward and one back before safely descending back to bed. Pt returns to bed ModI and is left with alarm set and needs in reach.  Clarisa Schools Mobility Specialist 03/28/22, 2:08 PM

## 2022-03-28 NOTE — TOC Transition Note (Signed)
Transition of Care [facility]) - CM/SW Discharge Note   Patient Details  Name: NEWEL OIEN MRN: [id number] Date of Birth: [birth year]  Transition of Care [facility]) CM/SW Contact:  Chapman Fitch, RN Phone Number: 03/28/2022, 4:26 PM   Clinical Narrative:    Notified that patient is leaving AMA and family has called a cab  Adelina Mings with [facility] notified of discharge      Barriers to Discharge: Continued Medical Work up   Patient Goals and CMS Choice Patient states their goals for this hospitalization and ongoing recovery are:: unable to states goals to lethargic   Choice offered to / list presented to : Adult Children  Discharge Placement                       Discharge Plan and Services In-house Referral: Clinical Social Work Discharge Planning Services: CM Consult                      HH Arranged: PT, OT          Social Determinants of Health (SDOH) Interventions     Readmission Risk Interventions     No data to display

## 2022-04-23 IMAGING — US US ABDOMEN COMPLETE
1 series · 14 of 25 positions shown · non-contrast
Comparison: None.

CLINICAL DATA: Elevated LFTs

EXAM:
ABDOMEN ULTRASOUND COMPLETE

[Series 1: us abdomen complete · 14 of 68 slices shown]
[im 1/68]
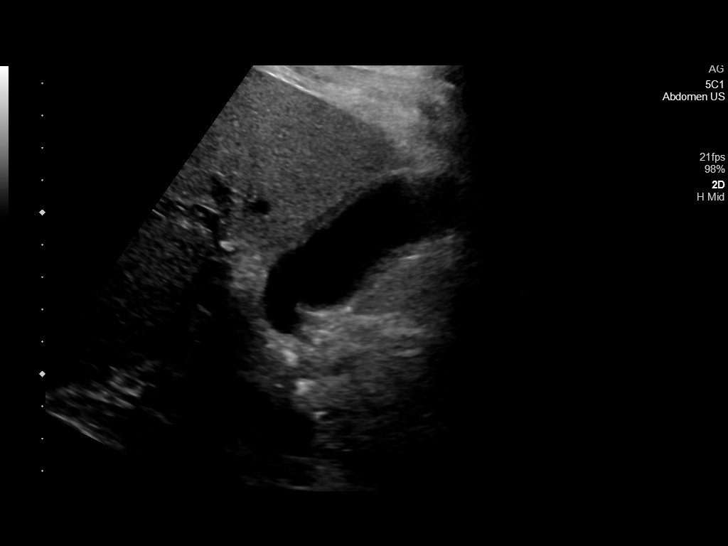
[im 6/68]
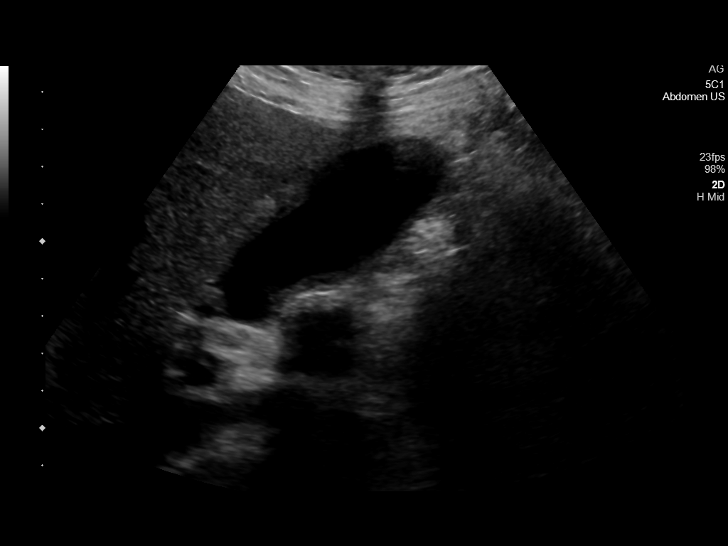
[im 12/68]
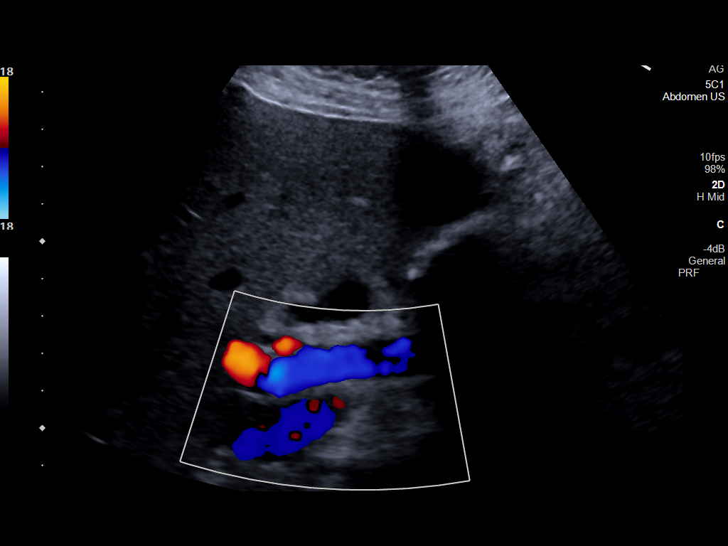
[im 17/68]
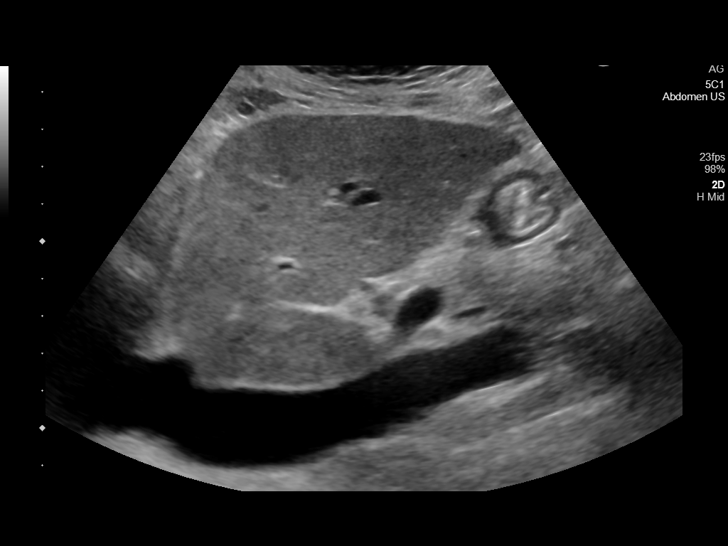
[im 23/68]
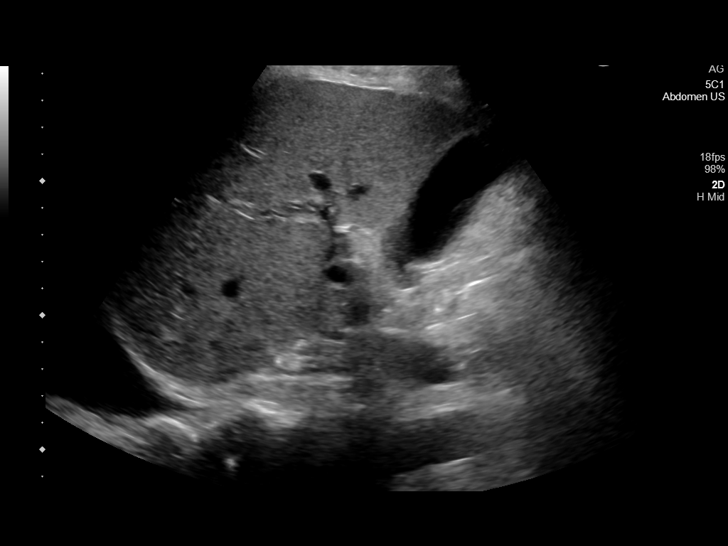
[im 26/68]
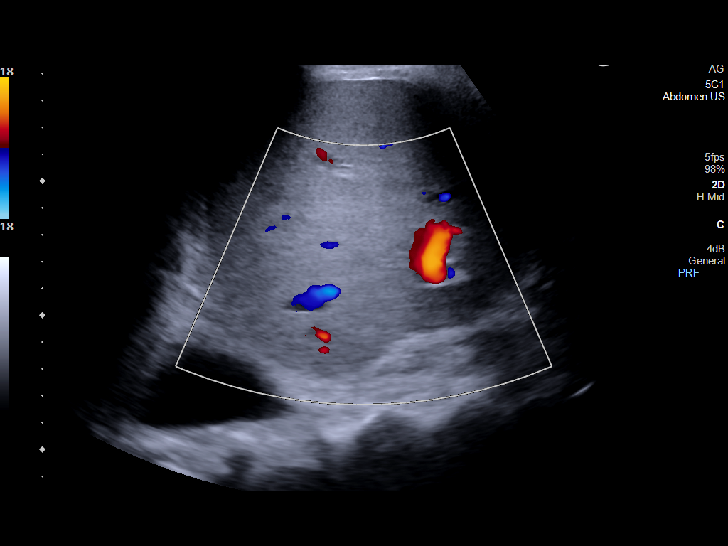
[im 31/68]
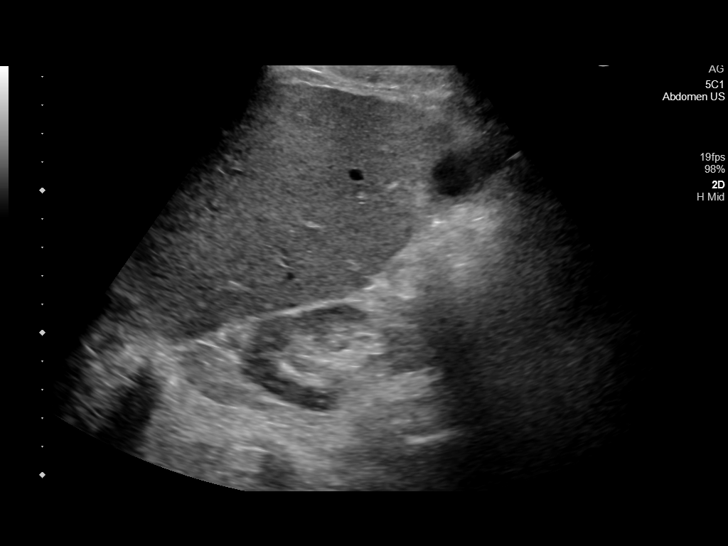
[im 37/68]
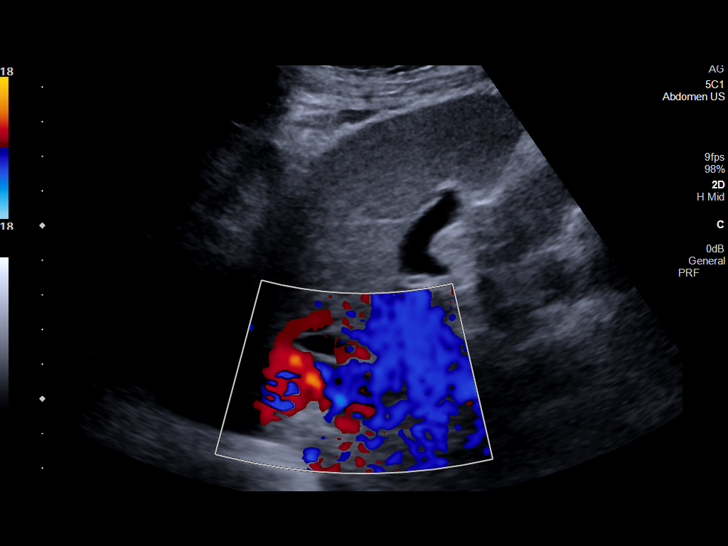
[im 42/68]
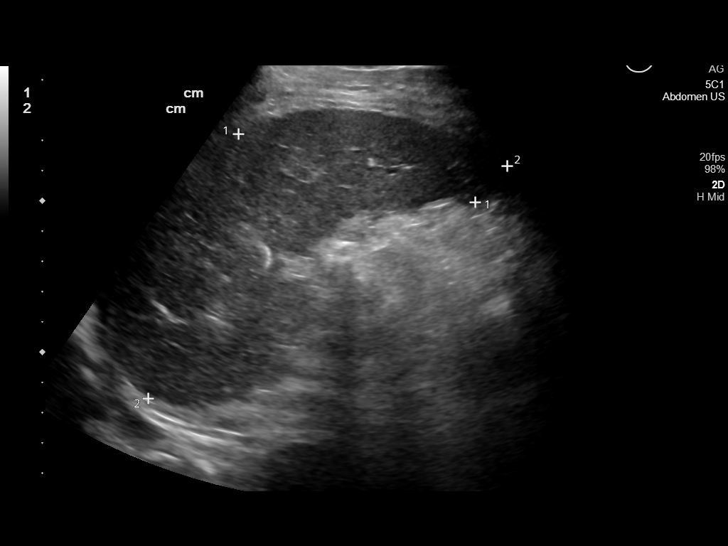
[im 45/68]
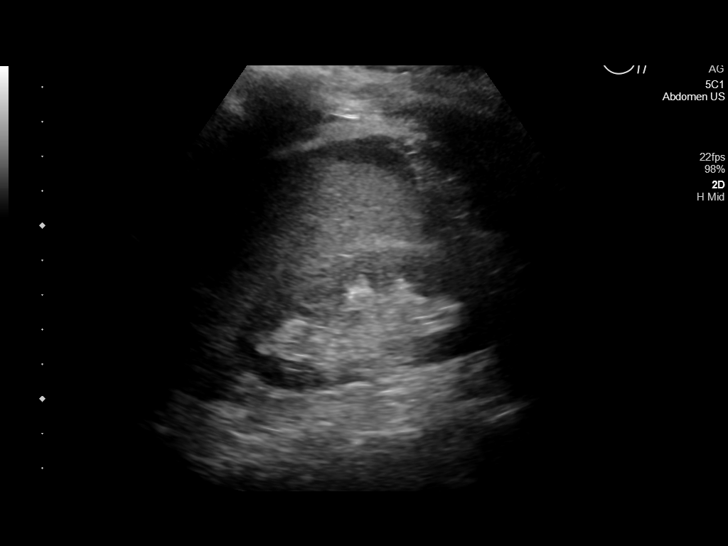
[im 51/68]
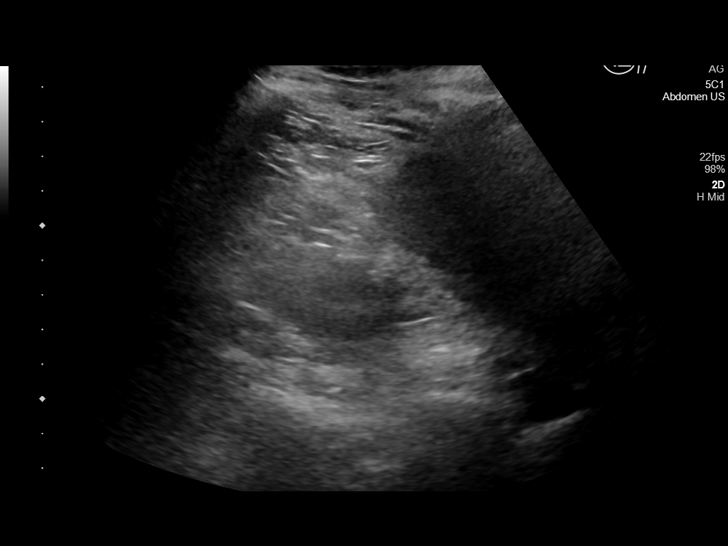
[im 56/68]
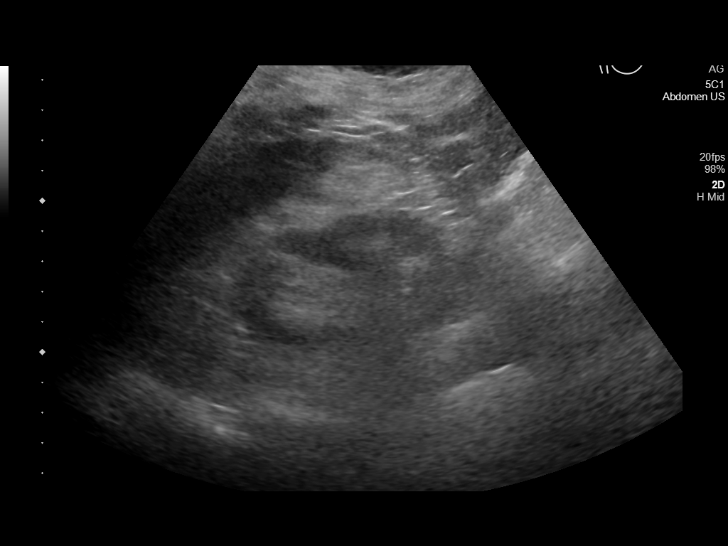
[im 62/68]
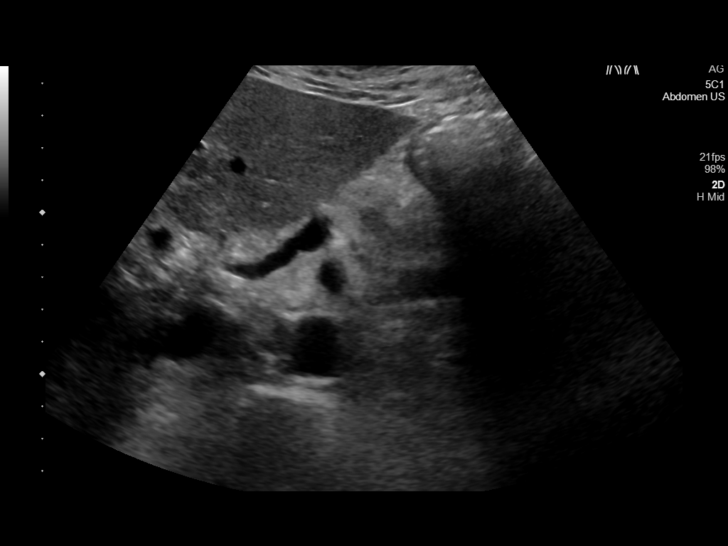
[im 68/68]
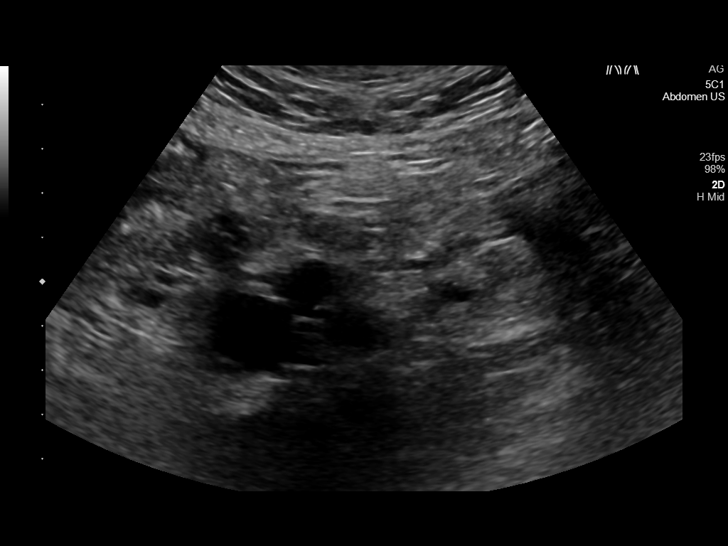

[14 of 25 positions shown; findings below may reference images not displayed]

FINDINGS: Gallbladder: No gallstones or wall thickening visualized. No
sonographic Murphy sign noted by sonographer.

Common bile duct: Diameter: 3 mm

Liver: No focal lesion identified. Increased liver echogenicity.
Portal vein is patent on color Doppler imaging with normal direction
of blood flow towards the liver.

IVC: No abnormality visualized.

Pancreas: Visualized portion unremarkable.

Spleen: Spleen volume is large at 14.1 x 10.3 x 8.1 cm calculated
volume of 618 cc.

Right Kidney: Length: 8.3. Echogenicity within normal limits. No
mass or hydronephrosis visualized.

Left Kidney: Length: 8.8. Echogenicity within normal limits. No mass
or hydronephrosis visualized.

Abdominal aorta: No aneurysm visualized.

Other findings: Small bilateral pleural effusions
IMPRESSION: 1. No acute findings in the abdomen. Normal gallbladder and common
bile duct.
2. Increased liver echogenicity commonly represents hepatic
steatosis.
3. Mild splenomegaly.
4. Small bilateral pleural effusions.

## 2022-07-11 ENCOUNTER — Other Ambulatory Visit: Payer: Self-pay

## 2022-07-11 ENCOUNTER — Encounter: Payer: Self-pay | Admitting: Pulmonary Disease

## 2022-07-11 ENCOUNTER — Emergency Department: Payer: Medicare HMO

## 2022-07-11 ENCOUNTER — Inpatient Hospital Stay
Admission: EM | Admit: 2022-07-11 | Discharge: 2022-07-13 | DRG: 377 | Disposition: A | Discharge status: Home / Self Care | Payer: Medicare HMO | Attending: Hospitalist | Admitting: Hospitalist

## 2022-07-11 DIAGNOSIS — R627 Adult failure to thrive: Secondary | ICD-10-CM | POA: Diagnosis present

## 2022-07-11 DIAGNOSIS — K2971 Gastritis, unspecified, with bleeding: Secondary | ICD-10-CM | POA: Diagnosis present

## 2022-07-11 DIAGNOSIS — R739 Hyperglycemia, unspecified: Secondary | ICD-10-CM | POA: Diagnosis present

## 2022-07-11 DIAGNOSIS — D649 Anemia, unspecified: Principal | ICD-10-CM

## 2022-07-11 DIAGNOSIS — I4891 Unspecified atrial fibrillation: Secondary | ICD-10-CM | POA: Diagnosis present

## 2022-07-11 DIAGNOSIS — K264 Chronic or unspecified duodenal ulcer with hemorrhage: Secondary | ICD-10-CM | POA: Diagnosis present

## 2022-07-11 DIAGNOSIS — N179 Acute kidney failure, unspecified: Secondary | ICD-10-CM | POA: Diagnosis present

## 2022-07-11 DIAGNOSIS — Z8616 Personal history of COVID-19: Secondary | ICD-10-CM

## 2022-07-11 DIAGNOSIS — K7460 Unspecified cirrhosis of liver: Secondary | ICD-10-CM | POA: Diagnosis present

## 2022-07-11 DIAGNOSIS — I420 Dilated cardiomyopathy: Secondary | ICD-10-CM | POA: Diagnosis present

## 2022-07-11 DIAGNOSIS — R578 Other shock: Secondary | ICD-10-CM | POA: Diagnosis present

## 2022-07-11 DIAGNOSIS — D62 Acute posthemorrhagic anemia: Secondary | ICD-10-CM | POA: Diagnosis present

## 2022-07-11 DIAGNOSIS — G9340 Encephalopathy, unspecified: Secondary | ICD-10-CM | POA: Diagnosis present

## 2022-07-11 DIAGNOSIS — N400 Enlarged prostate without lower urinary tract symptoms: Secondary | ICD-10-CM | POA: Diagnosis present

## 2022-07-11 DIAGNOSIS — Z791 Long term (current) use of non-steroidal anti-inflammatories (NSAID): Secondary | ICD-10-CM

## 2022-07-11 DIAGNOSIS — D631 Anemia in chronic kidney disease: Secondary | ICD-10-CM | POA: Diagnosis present

## 2022-07-11 DIAGNOSIS — R000 Tachycardia, unspecified: Secondary | ICD-10-CM | POA: Diagnosis present

## 2022-07-11 DIAGNOSIS — I5022 Chronic systolic (congestive) heart failure: Secondary | ICD-10-CM | POA: Diagnosis present

## 2022-07-11 DIAGNOSIS — F10231 Alcohol dependence with withdrawal delirium: Secondary | ICD-10-CM | POA: Diagnosis present

## 2022-07-11 DIAGNOSIS — Z8711 Personal history of peptic ulcer disease: Secondary | ICD-10-CM

## 2022-07-11 DIAGNOSIS — K760 Fatty (change of) liver, not elsewhere classified: Secondary | ICD-10-CM | POA: Diagnosis present

## 2022-07-11 DIAGNOSIS — K2091 Esophagitis, unspecified with bleeding: Secondary | ICD-10-CM | POA: Diagnosis present

## 2022-07-11 DIAGNOSIS — K922 Gastrointestinal hemorrhage, unspecified: Secondary | ICD-10-CM | POA: Diagnosis present

## 2022-07-11 DIAGNOSIS — F22 Delusional disorders: Secondary | ICD-10-CM | POA: Diagnosis present

## 2022-07-11 DIAGNOSIS — I700 Atherosclerosis of aorta: Secondary | ICD-10-CM | POA: Diagnosis present

## 2022-07-11 DIAGNOSIS — M1A9XX1 Chronic gout, unspecified, with tophus (tophi): Secondary | ICD-10-CM | POA: Diagnosis present

## 2022-07-11 DIAGNOSIS — D72829 Elevated white blood cell count, unspecified: Secondary | ICD-10-CM | POA: Diagnosis present

## 2022-07-11 DIAGNOSIS — N1831 Chronic kidney disease, stage 3a: Secondary | ICD-10-CM | POA: Diagnosis present

## 2022-07-11 DIAGNOSIS — Z6821 Body mass index (BMI) 21.0-21.9, adult: Secondary | ICD-10-CM

## 2022-07-11 DIAGNOSIS — E440 Moderate protein-calorie malnutrition: Secondary | ICD-10-CM | POA: Diagnosis present

## 2022-07-11 DIAGNOSIS — I272 Pulmonary hypertension, unspecified: Secondary | ICD-10-CM | POA: Diagnosis present

## 2022-07-11 DIAGNOSIS — K449 Diaphragmatic hernia without obstruction or gangrene: Secondary | ICD-10-CM | POA: Diagnosis present

## 2022-07-11 DIAGNOSIS — M5136 Other intervertebral disc degeneration, lumbar region: Secondary | ICD-10-CM | POA: Diagnosis present

## 2022-07-11 HISTORY — DX: Gastrointestinal hemorrhage, unspecified: K92.2

## 2022-07-11 LAB — COMPREHENSIVE METABOLIC PANEL
ALT: 46 U/L — ABNORMAL HIGH (ref 0–44)
AST: 57 U/L — ABNORMAL HIGH (ref 15–41)
Albumin: 3.4 g/dL — ABNORMAL LOW (ref 3.5–5.0)
Alkaline Phosphatase: 61 U/L (ref 38–126)
Anion gap: 12 (ref 5–15)
BUN: 41 mg/dL — ABNORMAL HIGH (ref 8–23)
CO2: 19 mmol/L — ABNORMAL LOW (ref 22–32)
Calcium: 8.9 mg/dL (ref 8.9–10.3)
Chloride: 108 mmol/L (ref 98–111)
Creatinine, Ser: 1.82 mg/dL — ABNORMAL HIGH (ref 0.61–1.24)
GFR, Estimated: 40 mL/min — ABNORMAL LOW (ref >60)
Glucose, Bld: 154 mg/dL — ABNORMAL HIGH (ref 70–99)
Potassium: 4 mmol/L (ref 3.5–5.1)
Sodium: 139 mmol/L (ref 135–145)
Total Bilirubin: 0.6 mg/dL (ref 0.3–1.2)
Total Protein: 7.2 g/dL (ref 6.5–8.1)

## 2022-07-11 LAB — URINE DRUG SCREEN, QUALITATIVE (ARMC ONLY)
Amphetamines, Ur Screen: NOT DETECTED
Barbiturates, Ur Screen: NOT DETECTED
Benzodiazepine, Ur Scrn: NOT DETECTED
Cannabinoid 50 Ng, Ur ~~LOC~~: NOT DETECTED
Cocaine Metabolite,Ur ~~LOC~~: NOT DETECTED
MDMA (Ecstasy)Ur Screen: NOT DETECTED
Methadone Scn, Ur: NOT DETECTED
Opiate, Ur Screen: NOT DETECTED
Phencyclidine (PCP) Ur S: NOT DETECTED
Tricyclic, Ur Screen: NOT DETECTED

## 2022-07-11 LAB — URINALYSIS, ROUTINE W REFLEX MICROSCOPIC
Bilirubin Urine: NEGATIVE
Glucose, UA: NEGATIVE mg/dL
Hgb urine dipstick: NEGATIVE
Ketones, ur: NEGATIVE mg/dL
Leukocytes,Ua: NEGATIVE
Nitrite: NEGATIVE
Protein, ur: NEGATIVE mg/dL
Specific Gravity, Urine: 1.035 — ABNORMAL HIGH (ref 1.005–1.030)
pH: 5 (ref 5.0–8.0)

## 2022-07-11 LAB — ETHANOL: Alcohol, Ethyl (B): <10 mg/dL (ref <10)

## 2022-07-11 LAB — CBC WITH DIFFERENTIAL/PLATELET
Abs Immature Granulocytes: 0.18 10*3/uL — ABNORMAL HIGH (ref 0.00–0.07)
Basophils Absolute: 0 10*3/uL (ref 0.0–0.1)
Basophils Relative: 0 %
Eosinophils Absolute: 0.1 10*3/uL (ref 0.0–0.5)
Eosinophils Relative: 1 %
HCT: 16.3 % — ABNORMAL LOW (ref 39.0–52.0)
Hemoglobin: 4.5 g/dL — CL (ref 13.0–17.0)
Immature Granulocytes: 1 %
Lymphocytes Relative: 10 %
Lymphs Abs: 1.4 10*3/uL (ref 0.7–4.0)
MCH: 25.6 pg — ABNORMAL LOW (ref 26.0–34.0)
MCHC: 27.6 g/dL — ABNORMAL LOW (ref 30.0–36.0)
MCV: 92.6 fL (ref 80.0–100.0)
Monocytes Absolute: 1 10*3/uL (ref 0.1–1.0)
Monocytes Relative: 7 %
Neutro Abs: 11.4 10*3/uL — ABNORMAL HIGH (ref 1.7–7.7)
Neutrophils Relative %: 81 %
Platelets: 383 10*3/uL (ref 150–400)
RBC: 1.76 MIL/uL — ABNORMAL LOW (ref 4.22–5.81)
RDW: 20.6 % — ABNORMAL HIGH (ref 11.5–15.5)
WBC: 14.1 10*3/uL — ABNORMAL HIGH (ref 4.0–10.5)
nRBC: 0 % (ref 0.0–0.2)

## 2022-07-11 LAB — HEMOGLOBIN AND HEMATOCRIT, BLOOD
HCT: 25.4 % — ABNORMAL LOW (ref 39.0–52.0)
Hemoglobin: 8 g/dL — ABNORMAL LOW (ref 13.0–17.0)

## 2022-07-11 LAB — LIPASE, BLOOD: Lipase: 48 U/L (ref 11–51)

## 2022-07-11 LAB — PREPARE RBC (CROSSMATCH)

## 2022-07-11 LAB — MRSA NEXT GEN BY PCR, NASAL: MRSA by PCR Next Gen: NOT DETECTED

## 2022-07-11 LAB — PROTIME-INR
INR: 1.1 (ref 0.8–1.2)
Prothrombin Time: 14 seconds (ref 11.4–15.2)

## 2022-07-11 LAB — GLUCOSE, CAPILLARY: Glucose-Capillary: 122 mg/dL — ABNORMAL HIGH (ref 70–99)

## 2022-07-11 MED ORDER — LORAZEPAM 2 MG/ML IJ SOLN
1.0000 mg | INTRAMUSCULAR | Status: DC | PRN
  Administered 2022-07-11 – 2022-07-12 (×3): 2 mg via INTRAVENOUS
  Filled 2022-07-11 (×4): qty 1

## 2022-07-11 MED ORDER — PANTOPRAZOLE SODIUM 40 MG IV SOLR
40.0000 mg | Freq: Once | INTRAVENOUS | Status: AC
  Administered 2022-07-11: 40 mg via INTRAVENOUS
  Filled 2022-07-11: qty 10

## 2022-07-11 MED ORDER — LACTATED RINGERS IV BOLUS
1000.0000 mL | Freq: Once | INTRAVENOUS | Status: AC
  Administered 2022-07-11: 1000 mL via INTRAVENOUS

## 2022-07-11 MED ORDER — PANTOPRAZOLE SODIUM 40 MG IV SOLR
40.0000 mg | Freq: Two times a day (BID) | INTRAVENOUS | Status: DC
  Administered 2022-07-11: 40 mg via INTRAVENOUS
  Administered 2022-07-11: 80 mg via INTRAVENOUS
  Administered 2022-07-12 (×2): 40 mg via INTRAVENOUS
  Filled 2022-07-11 (×4): qty 10

## 2022-07-11 MED ORDER — OCTREOTIDE LOAD VIA INFUSION
50.0000 ug | Freq: Once | INTRAVENOUS | Status: AC
  Administered 2022-07-11: 50 ug via INTRAVENOUS
  Filled 2022-07-11: qty 25

## 2022-07-11 MED ORDER — IOHEXOL 300 MG/ML  SOLN
75.0000 mL | Freq: Once | INTRAMUSCULAR | Status: AC | PRN
  Administered 2022-07-11: 75 mL via INTRAVENOUS

## 2022-07-11 MED ORDER — IOHEXOL 350 MG/ML SOLN
75.0000 mL | Freq: Once | INTRAVENOUS | Status: AC | PRN
  Administered 2022-07-11: 75 mL via INTRAVENOUS

## 2022-07-11 MED ORDER — ONDANSETRON HCL 4 MG/2ML IJ SOLN
4.0000 mg | Freq: Four times a day (QID) | INTRAMUSCULAR | Status: DC | PRN
  Administered 2022-07-11 – 2022-07-12 (×3): 4 mg via INTRAVENOUS
  Filled 2022-07-11 (×3): qty 2

## 2022-07-11 MED ORDER — ORAL CARE MOUTH RINSE: 15.0000 mL | OROMUCOSAL | Status: DC | PRN

## 2022-07-11 MED ORDER — LORAZEPAM 1 MG PO TABS: 1.0000 mg | ORAL_TABLET | ORAL | Status: DC | PRN

## 2022-07-11 MED ORDER — SODIUM CHLORIDE 0.9 % IV SOLN
1.0000 g | Freq: Once | INTRAVENOUS | Status: AC
  Administered 2022-07-11: 1 g via INTRAVENOUS
  Filled 2022-07-11: qty 10

## 2022-07-11 MED ORDER — SODIUM CHLORIDE 0.9 % IV SOLN
50.0000 ug/h | INTRAVENOUS | Status: DC
  Administered 2022-07-11 – 2022-07-12 (×3): 50 ug/h via INTRAVENOUS
  Filled 2022-07-11 (×3): qty 1

## 2022-07-11 MED ORDER — ONDANSETRON HCL 4 MG/2ML IJ SOLN
4.0000 mg | Freq: Once | INTRAMUSCULAR | Status: AC
  Administered 2022-07-11: 4 mg via INTRAVENOUS
  Filled 2022-07-11: qty 2

## 2022-07-11 MED ORDER — SODIUM CHLORIDE 0.9 % IV SOLN: INTRAVENOUS | Status: DC

## 2022-07-11 MED ORDER — SODIUM CHLORIDE 0.9 % IV SOLN: 10.0000 mL/h | Freq: Once | INTRAVENOUS | Status: DC

## 2022-07-11 MED ORDER — LORAZEPAM 2 MG/ML IJ SOLN
1.0000 mg | Freq: Once | INTRAMUSCULAR | Status: AC
  Administered 2022-07-11: 1 mg via INTRAVENOUS
  Filled 2022-07-11: qty 1

## 2022-07-11 MED ORDER — THIAMINE HCL 100 MG/ML IJ SOLN
Freq: Every day | INTRAVENOUS | Status: DC
  Filled 2022-07-11 (×3): qty 1000

## 2022-07-11 MED ORDER — DOCUSATE SODIUM 100 MG PO CAPS: 100.0000 mg | ORAL_CAPSULE | Freq: Two times a day (BID) | ORAL | Status: DC | PRN

## 2022-07-11 MED ORDER — SODIUM CHLORIDE 0.9 % IV SOLN
1.0000 g | INTRAVENOUS | Status: DC
  Filled 2022-07-11: qty 10

## 2022-07-11 MED ORDER — PANTOPRAZOLE INFUSION (NEW) - SIMPLE MED
8.0000 mg/h | INTRAVENOUS | Status: DC
  Administered 2022-07-11: 8 mg/h via INTRAVENOUS
  Filled 2022-07-11: qty 100

## 2022-07-11 MED ORDER — SODIUM CHLORIDE 0.9 % IV BOLUS
1000.0000 mL | Freq: Once | INTRAVENOUS | Status: AC
  Administered 2022-07-11: 1000 mL via INTRAVENOUS

## 2022-07-11 MED ORDER — PANTOPRAZOLE SODIUM 40 MG IV SOLR: 40.0000 mg | Freq: Two times a day (BID) | INTRAVENOUS | Status: DC

## 2022-07-11 MED ORDER — POLYETHYLENE GLYCOL 3350 17 G PO PACK: 17.0000 g | PACK | Freq: Every day | ORAL | Status: DC | PRN

## 2022-07-11 MED ORDER — METOCLOPRAMIDE HCL 5 MG/ML IJ SOLN
10.0000 mg | Freq: Once | INTRAMUSCULAR | Status: AC
  Administered 2022-07-11: 10 mg via INTRAVENOUS
  Filled 2022-07-11: qty 2

## 2022-07-11 NOTE — Consult Note (Addendum)
GI Inpatient Consult Note  Reason for Consult: GI bleed  Attending Requesting Consult:Dr. Merlyn Lot  History of Present Illness: John Escobar is a 66 y.o. male seen for evaluation of coffee ground emesis, severe anemia at the request of Dr. Quentin Cornwall. Patient has a PMH of chronic alcohol use disorder, alcoholic hepatitis, dilated cardiomyopathy, CKD 3 a, GI bleed d/t PUD and gastric AVM treated with APC 2022, tophaceous gout, chronic NSAID use, chronic iron deficiency anemia, hepatic steatosis.  Patient presented to the [facility] ED today for chief complaint of feeling SOB and sick for one week. EMS reports not eating 2/2 N/V. Pallor. Upon presentation to the ED, vital signs were 98.8, 122, 18, 120/94, SpO2 100%. Labs were significant for WBC 14.1, Hgb 4.5 - previous 7.2, Plt 383,  BUN 41, creatinine 1.82, alb 3.4,  ALT 46, t bili 0.6, GFR 40, lipase 48, ethanol <10, INR 1.1. Imaging studies revealed: CXR: No acute cardiac cardiopulmonary abnormality. CT ANGIO: No evidence of active bleeding. GI is consulted for evaluation and management of GI bleed.   Admitted 07/31/2021 with severe symptomatic anemia.  Hemoglobin dropped hemoglobin 5 from 8.3, platelets 181, iron studies revealed severe iron deficiency anemia.  He received 2 units of PRBC and responded appropriately.  AST 130 ALT 123 total bilirubin 1.4.  Viral hepatitis acute panel non reactive. BNP greater than 4500.  Ferritin 8.   Admitted 03/20/2022 for cute symptomatic anemia due to coffee-ground emesis.  Hemoglobin improved from 5-7.8 after 3 unit PRBCs.  He declined endoscopic evaluation.  Hospitalization complicated by paranoia and refusal of care for which psychiatry was consulted recommended no medications, supportive care.  He had been followed by palliative care with family meeting.  He also had failure to thrive, moderate protein calorie malnutrition.  Alcohol counseling provided.  Initially treated with withdrawal, symptoms  resolved durably.  Ultrasound revealed hepatic steatosis without cirrhotic morphology.  LFTs within normal. HIV neg,    Last Colonoscopy: previously reports last csy >10 years ago in [place]  Last Endoscopy: 11/29/2020: Indic:  Acute posthemorrhagic hemorrhagic anemia, hematemesis, melena: Impression: Normal esophagus.  A single bleeding angiectasia in the stomach treated with bipolar cautery.  2 nonbleeding angiectasia's in the stomach treated with bipolar cautery.  2 cm hiatal hernia.  Nonbleeding gastric ulcer with no stigmata of bleeding.  Gastritis.  Duodenal erosions without bleeding.   Today, patient was seen in the emergency department.  History provided by the patient, chart review, ER physician.  Family not available.  He returned from CT angio with hypotension and tachycardia. He is receiving 2 of 3 units RBCs during evaluation.  The patient is drowsy/lethargic, arouses and answers questions minimally.  He reports that he has had shortness of breath for a while prior to coming in.  He reports abdominal pain, but no chest pain. Dry heaving in the ED and at home saw "dark chocolate" emesis. He has passed no black stool, and has not seen fresh blood in the stool for a while.  Does not take a PPI.  He does take Goody powders 2 to 3/day for joint pain.  He drinks 2-3  twelve ounce beers per day.  Negative tobacco products.   Past Medical History:  Past Medical History:  Diagnosis Date   CKD (chronic kidney disease), stage III ([place])    COVID-19 virus infection w/ Sepsis 11/2020   Dilated cardiomyopathy ([place])    a. 07/2021 Echo: EF 20-25%, GrII DD. Low-nl RV fxn. Nl RVSP.  Mildly dil LA. Mod MR/TR.   ETOH abuse    Gastritis    HFrEF (heart failure with reduced ejection fraction) ([place])    a. 07/2021 Echo: EF 20-25%.   Hiatal hernia    Iron deficiency anemia    PUD (peptic ulcer disease)     Problem List: Patient Active Problem List   Diagnosis Date Noted   Upper GI bleed 07/11/2022    Acute blood loss anemia    Alcohol withdrawal syndrome without complication (HCC)    Tophaceous gout 03/23/2022   Failure to thrive in adult 03/23/2022   Malnutrition of moderate degree 03/22/2022   Acute GI bleeding    GIB (gastrointestinal bleeding) 03/20/2022   Acute on chronic combined systolic and diastolic CHF (congestive heart failure) ([place]) 08/02/2021   Acute HFrEF (heart failure with reduced ejection fraction) (HCC)    Symptomatic anemia 07/31/2021   Hyponatremia 07/31/2021   Acute kidney injury superimposed on CKD ([place]) 07/31/2021   Troponin I above reference range 07/31/2021   Acute anemia 11/28/2020   Weakness 11/28/2020   Elevated serum creatinine 11/28/2020   COVID-19 virus infection 11/28/2020   Lactic acidosis 11/28/2020   Severe sepsis ([place]) 11/28/2020    Past Surgical History: Past Surgical History:  Procedure Laterality Date   ELBOW BURSA SURGERY     ESOPHAGOGASTRODUODENOSCOPY (EGD) WITH PROPOFOL N/A 11/29/2020   Procedure: ESOPHAGOGASTRODUODENOSCOPY (EGD) WITH PROPOFOL;  Surgeon: Toledo, Benay Pike, MD;  Location: [facility] ENDOSCOPY;  Service: Gastroenterology;  Laterality: N/A;   SHOULDER SURGERY Left     Allergies: No Known Allergies  Home Medications: (Not in a hospital admission)  Home medication reconciliation was completed with the patient.   Scheduled Inpatient Medications:    pantoprazole (PROTONIX) IV  40 mg Intravenous Q12H    Continuous Inpatient Infusions:    sodium chloride     sodium chloride 125 mL/hr at 07/11/22 1307   [START ON 07/12/2022] cefTRIAXone (ROCEPHIN)  IV     octreotide (SANDOSTATIN) 500 mcg in sodium chloride 0.9 % 250 mL (2 mcg/mL) infusion 50 mcg/hr (07/11/22 1307)   sodium chloride 0.9 % 1,000 mL with thiamine 347 mg, folic acid 1 mg, M.V.I. Adult 10 mL infusion      PRN Inpatient Medications:   Family History: family history is not on file.  The patient's family history is negative for inflammatory bowel disorders, GI  malignancy, or solid organ transplantation.  Social History:   reports that he has never smoked. He has never used smokeless tobacco. He reports current alcohol use. He reports that he does not currently use drugs. The patient denies ETOH, tobacco, or drug use.   Review of Systems: Patient is lethargic and unable to get complete ROS   Physical Examination: BP 97/73   Pulse 97   Temp 97.7 F (36.5 C) (Oral) Comment: Warm blankets applied to patient  Resp 16   Wt 66.3 kg   SpO2 100%   BMI 21.58 kg/m  Gen: NAD, arouses to name, answers a few questions, lethargic HEENT: No jaundice Neck: supple Chest: CTA bilaterally, no wheezes, crackles, or other adventitious sounds CV: RRR, positive gallop, regular Abd: soft, mild RUQ tenderness, ND, +BS in all four quadrants; no HSM, guarding, rigidity, or rebound tenderness Ext: no edema, Skin: Pallor,  MSK: gouty changes noted hands and feet.  Data: Lab Results  Component Value Date   WBC 14.1 (H) 07/11/2022   HGB 4.5 (LL) 07/11/2022   HCT 16.3 (L) 07/11/2022   MCV 92.6 07/11/2022  PLT 383 07/11/2022   Recent Labs  Lab 07/11/22 1044  HGB 4.5*   Lab Results  Component Value Date   NA 139 07/11/2022   K 4.0 07/11/2022   CL 108 07/11/2022   CO2 19 (L) 07/11/2022   BUN 41 (H) 07/11/2022   CREATININE 1.82 (H) 07/11/2022   Lab Results  Component Value Date   ALT 46 (H) 07/11/2022   AST 57 (H) 07/11/2022   ALKPHOS 61 07/11/2022   BILITOT 0.6 07/11/2022   Recent Labs  Lab 07/11/22 1044  INR 1.1   CLINICAL DATA:  Nausea, vomiting and anemia. History of gastric ulcer.   EXAM: CT ANGIOGRAPHY ABDOMEN AND PELVIS WITH CONTRAST AND WITHOUT CONTRAST   TECHNIQUE: Multidetector CT imaging of the abdomen and pelvis was performed using the standard protocol during bolus administration of intravenous contrast. Multiplanar reconstructed images and MIPs were obtained and reviewed to evaluate the vascular anatomy.   RADIATION  DOSE REDUCTION: This exam was performed according to the departmental dose-optimization program which includes automated exposure control, adjustment of the mA and/or kV according to patient size and/or use of iterative reconstruction technique.   CONTRAST:  61mL OMNIPAQUE IOHEXOL 350 MG/ML SOLN, 3mL OMNIPAQUE IOHEXOL 300 MG/ML SOLN   COMPARISON:  None Available.   FINDINGS: VASCULAR   Aorta: Atherosclerosis of the abdominal aorta without evidence of aneurysm or dissection.   Celiac: Normally patent.  Normally patent visualized branch vessels.   SMA: Normally patent.  Replaced right hepatic artery.   Renals: Minimal atherosclerosis of bilateral single renal arteries. No evidence of renal artery stenosis.   IMA: Normally patent.   Inflow: Normally patent bilateral iliac arteries without stenosis or aneurysm.   Proximal Outflow: Normally patent bilateral common femoral arteries and femoral bifurcations.   Veins: Venous phase imaging demonstrates normal patency of venous structures in the abdomen and pelvis. No evidence of thrombus. No venous anatomic variants.   Review of the MIP images confirms the above findings.   NON-VASCULAR   Lower chest: No acute abnormality.   Hepatobiliary: No focal liver abnormality is seen. No gallstones, gallbladder wall thickening, or biliary dilatation.   Pancreas: Unremarkable. No pancreatic ductal dilatation or surrounding inflammatory changes.   Spleen: Normal in size without focal abnormality.   Adrenals/Urinary Tract: Adrenal glands are unremarkable. Kidneys are normal, without renal calculi, focal lesion, or hydronephrosis. Bladder is unremarkable.   Stomach/Bowel: Bowel shows no evidence of obstruction, ileus, inflammation or lesion. No ulcers identified. No evidence active bleeding into the gastrointestinal tract on arterial or venous phases. Mild scattered diverticula of the colon without evidence of diverticulitis. The  appendix is normal. No free intraperitoneal air.   Lymphatic: No enlarged abdominal or pelvic lymph nodes.   Reproductive: Mild prostatic enlargement.   Other: No abdominal wall hernia or abnormality. No abdominopelvic ascites.   Musculoskeletal: Degenerative disc disease at L4-5.   IMPRESSION: 1. No acute findings in the abdomen or pelvis. No evidence of active bleeding into the gastrointestinal tract on arterial or venous phases. 2. No visualized gastrointestinal ulcer or lesion by CT. 3. Atherosclerosis of the abdominal aorta without evidence of aneurysm or dissection. 4. Replaced right hepatic artery. 5. Mild prostatic enlargement. 6. Degenerative disc disease at L4-5.     Electronically Signed   By: Aletta Edouard M.D.   On: 07/11/2022 12:25    Assessment/Plan: Mr. Bukoski is a 66 y.o. male is seen for evaluation of coffee ground emesis, severe anemia at the request of Dr. Quentin Cornwall. Patient  has a PMH of chronic alcohol use disorder, alcoholic hepatitis, dilated cardiomyopathy, CKD 3 a, GI bleed d/t PUD and gastric AVM treated with APC 2022, tophaceous gout, chronic NSAID use, chronic iron deficiency anemia, hepatic steatosis.  He was seen in June for similar symptoms with likelihood upper GI bleed.  The patient has alcohol abuse disorder, but no cirrhosis.  He left AMA in June.  He will likely need EGD but first needs to be resuscitated.    #Acute on chronic severe IDA,  posthemorrhagic, likely upper GI source given history of dark chocolate emesis, longstanding NSAID use, alcohol use, and previous AVM and gastric ulcer findings on EGD.  Recommendations: 1. IV Protonix 80 mg x 1, and then 40 mg IV Protonix every 12 hours. 2. Two large bore IV's. 3. IV sandostatin gtt. 4. IV antibiotics for liver dx and GI bleed  5. NPO. 6. Serial H/H. Transfuse as needed 7. EGD when clinically feasible and patient is stable for IV sedation.  Dr. Virgina Jock in to talk with patient who  verbalized that he is agreeable to having an upper endoscopy. He states he does not want any colon studies done, " I don't want anything in my butt. "  He does report that he recalls a sore throat the last time he had an upper endoscopy and was reassured that he can have numbing medicine in his throat this time if needed. 8.  Recommend outpatient IV iron therapy and follow up with Hematology as outpatient. Chronic leukocytosis.  9. Recommend stopping Goody powders and NSAID use.  10. NPO for now.  11. He declines colonoscopy   Alcohol use disorder, possible liver disease, with normal findings of LFTs, no evidence of cirrhosis on imaging studies. No elevation in INR. CIWA precautions Recommend alcohol  cessation with assist from addiction specialist.  3.  IV abx 4. Monitor hepatic panel  Thank you for the consult. Please call with questions or concerns.  Denice Paradise, [facility] [phone]

## 2022-07-11 NOTE — ED Triage Notes (Signed)
Per EMS, pt family called 21 for pt. Pt sts that he has been sick for a week. EMS sts that pt has not been eating due to the fact that he is nauseated and vomiting. EMS says that pt does having stomach ulcers. Pt is pale in color. A/Ox4

## 2022-07-11 NOTE — Consult Note (Signed)
[facility] for Electrolyte Monitoring and Replacement   Recent Labs: Potassium (mmol/L)  Date Value  07/11/2022 4.0  09/07/2012 4.0   Magnesium (mg/dL)  Date Value  03/24/2022 2.3  04/28/2012 2.5 (H)   Calcium (mg/dL)  Date Value  07/11/2022 8.9   Calcium, Total (mg/dL)  Date Value  09/07/2012 8.8   Albumin (g/dL)  Date Value  07/11/2022 3.4 (L)  09/06/2012 4.4   Phosphorus (mg/dL)  Date Value  03/24/2022 3.9   Sodium (mmol/L)  Date Value  07/11/2022 139  09/07/2012 137   Assessment: Patient is a 66 y/o M with medical history including EtOH use disorder, alcoholic hepatitis, dilated cardiomyopathy, CKD, GIB d/t PUD and gastric AVM treated with APC 2022, tophaceous gout, chronic NSAID use, IDA, hepatic steatosis who presented to the ED 9/29 with coffee ground emesis and severe anemia. Pharmacy consulted to assist with electrolyte monitoring and replacement as indicated.  Scr 1.82 on admission, appears overall consistent with baseline  Goal of Therapy:  Electrolytes within normal limits  Plan:  --No electrolyte replacement indicated at this time --Follow-up electrolytes with AM labs tomorrow  Benita Gutter 07/11/2022 2:07 PM

## 2022-07-11 NOTE — Progress Notes (Signed)
Initial Nutrition Assessment  DOCUMENTATION CODES:   Not applicable  INTERVENTION:   RD will add supplements once pt's diet is advanced  Pt at high refeed risk; recommend monitor potassium, magnesium and phosphorus labs daily until stable  Check iron, TIBC, ferritin, transferrin, folate and B12  NUTRITION DIAGNOSIS:   Inadequate oral intake related to acute illness as evidenced by NPO status.  GOAL:   Patient will meet greater than or equal to 90% of their needs  MONITOR:   Diet advancement, Labs, Weight trends, Skin, I & O's  REASON FOR ASSESSMENT:   Malnutrition Screening Tool    ASSESSMENT:   66 y.o. male with PMH of chronic alcohol use disorder, alcoholic hepatitis, dilated cardiomyopathy, CKD III, PUD with gastric AVM s/p APC 2022, gout, chronic NSAID use, chronic iron deficiency anemia, hepatic steatosis, COVID 19 (2022), hiatal hernia and CHF who is admitted with coffee ground emesis, severe anemia.  Unable to see pt as pt in the ED at time of RD visit. Pt is well known to this RD from previous admissions. Pt with poor appetite and oral intake at baseline r/t etoh abuse. Pt generally has poor oral intake in hospital as well. Pt will drink Ensure supplements. Pt currently NPO for GIB. RD will add supplements once pt's diet is advanced. Pt with h/o IDA; last labs checked 07/2021. Will recheck iron/anemia labs. Pt is at high refeed risk. RD will obtain nutrition related history and exam at follow up. Pt previously diagnosed with malnutrition; suspect pt will meet criteria again.   Per chart, pt appears weight stable pta.   Medications reviewed and include: protonix, ceftriaxone, MVI, folic acid and thiamine  Labs reviewed: K 4.0 wnl, BUN 41(H), creat 1.82(H) Wbc- 14.1(H), Hgb 4.5(L), Hct 16.3(L), MCH 25.6(L), MCHC 27.6(L)  NUTRITION - FOCUSED PHYSICAL EXAM: Unable to perform at this time   Diet Order:   Diet Order             Diet NPO time specified  Diet  effective now                  EDUCATION NEEDS:   No education needs have been identified at this time  Skin:  Skin Assessment: Reviewed RN Assessment  Last BM:  9/29  Height:   Ht Readings from Last 1 Encounters:  07/11/22 5\' 9"  (1.753 m)    Weight:   Wt Readings from Last 1 Encounters:  07/11/22 66.3 kg    Ideal Body Weight:  72.7 kg  BMI:  Body mass index is 21.58 kg/m.  Estimated Nutritional Needs:   Kcal:  1900-2200kcal/day  Protein:  95-110g/day  Fluid:  1.7-2.0L/day  Koleen Distance MS, RD, LDN Please refer to [facility] for RD and/or RD on-call/weekend/after hours pager

## 2022-07-11 NOTE — ED Provider Notes (Signed)
[facility] Provider Note    Event Date/Time   First MD Initiated Contact with Patient 07/11/22 1038     (approximate)   History   Emesis   HPI  John Escobar is a 66 y.o. male significant history of alcohol abuse as well as GI bleeding in the past with admission to the hospital for symptomatic anemia secondary to presumed GI bleeding requiring blood transfusions subsequently leaving AMA in June presents to the ER today for nausea vomiting dark-colored emesis.  Patient denies any chest pain no fevers or chills.     Physical Exam   Triage Vital Signs: ED Triage Vitals  Enc Vitals Group     BP      Pulse      Resp      Temp      Temp src      SpO2      Weight      Height      Head Circumference      Peak Flow      Pain Score      Pain Loc      Pain Edu?      Excl. in [place]?     Most recent vital signs: Vitals:   07/11/22 1317 07/11/22 1405  BP: 104/82 103/71  Pulse:  (!) 101  Resp: 18 19  Temp: 97.8 F (36.6 C) 98.1 F (36.7 C)  SpO2:  94%     Constitutional: Alert, chronically ill appearing Eyes: Conjunctivae are normal.  Head: Atraumatic. Nose: No congestion/rhinnorhea. Mouth/Throat: Mucous membranes are moist.   Neck: Painless ROM.  Cardiovascular:   Good peripheral circulation. Respiratory: Normal respiratory effort.  No retractions.  Gastrointestinal: Soft and nontender.  Musculoskeletal:  no deformity Neurologic:  MAE spontaneously. No gross focal neurologic deficits are appreciated.  Skin:  Skin is warm, dry and intact. No rash noted. Psychiatric: Mood and affect are normal. Speech and behavior are normal.    ED Results / Procedures / Treatments   Labs (all labs ordered are listed, but only abnormal results are displayed) Labs Reviewed  CBC WITH DIFFERENTIAL/PLATELET - Abnormal; Notable for the following components:      Result Value   WBC 14.1 (*)    RBC 1.76 (*)    Hemoglobin 4.5 (*)    HCT 16.3 (*)    MCH  25.6 (*)    MCHC 27.6 (*)    RDW 20.6 (*)    Neutro Abs 11.4 (*)    Abs Immature Granulocytes 0.18 (*)    All other components within normal limits  COMPREHENSIVE METABOLIC PANEL - Abnormal; Notable for the following components:   CO2 19 (*)    Glucose, Bld 154 (*)    BUN 41 (*)    Creatinine, Ser 1.82 (*)    Albumin 3.4 (*)    AST 57 (*)    ALT 46 (*)    GFR, Estimated 40 (*)    All other components within normal limits  GLUCOSE, CAPILLARY - Abnormal; Notable for the following components:   Glucose-Capillary 122 (*)    All other components within normal limits  MRSA NEXT GEN BY PCR, NASAL  LIPASE, BLOOD  ETHANOL  PROTIME-INR  URINE DRUG SCREEN, QUALITATIVE ([facility] ONLY)  URINALYSIS, ROUTINE W REFLEX MICROSCOPIC  HEMOGLOBIN AND HEMATOCRIT, BLOOD  HEMOGLOBIN AND HEMATOCRIT, BLOOD  TYPE AND SCREEN  PREPARE RBC (CROSSMATCH)     EKG  ED ECG REPORT I, Merlyn Lot, the attending physician,  personally viewed and interpreted this ECG.   Date: 07/11/2022  EKG Time: 10:39  Rate: 120  Rhythm: sinus  Axis: normal  Intervals: normal  ST&T Change: st depressions, elevation in avl but no stemi criteria    RADIOLOGY Please see ED Course for my review and interpretation.  I personally reviewed all radiographic images ordered to evaluate for the above acute complaints and reviewed radiology reports and findings.  These findings were personally discussed with the patient.  Please see medical record for radiology report.    PROCEDURES:  Critical Care performed: Yes, see critical care procedure note(s)  .Critical Care  Performed by: Merlyn Lot, MD Authorized by: Merlyn Lot, MD   Critical care provider statement:    Critical care time (minutes):  35   Critical care was necessary to treat or prevent imminent or life-threatening deterioration of the following conditions:  Circulatory failure   Critical care was time spent personally by me on the following  activities:  Ordering and performing treatments and interventions, ordering and review of laboratory studies, ordering and review of radiographic studies, pulse oximetry, re-evaluation of patient's condition, review of old charts, obtaining history from patient or surrogate, examination of patient, evaluation of patient's response to treatment, discussions with primary provider, discussions with consultants and development of treatment plan with patient or surrogate    MEDICATIONS ORDERED IN ED: Medications  0.9 %  sodium chloride infusion (0 mL/hr Intravenous Hold 07/11/22 1420)  octreotide (SANDOSTATIN) 2 mcg/mL load via infusion 50 mcg (50 mcg Intravenous Bolus from Bag 07/11/22 1308)    And  octreotide (SANDOSTATIN) 500 mcg in sodium chloride 0.9 % 250 mL (2 mcg/mL) infusion (50 mcg/hr Intravenous Infusion Verify 07/11/22 1502)  pantoprazole (PROTONIX) injection 40 mg (80 mg Intravenous Given 07/11/22 1249)  cefTRIAXone (ROCEPHIN) 1 g in sodium chloride 0.9 % 100 mL IVPB (has no administration in time range)  docusate sodium (COLACE) capsule 100 mg (has no administration in time range)  polyethylene glycol (MIRALAX / GLYCOLAX) packet 17 g (has no administration in time range)  ondansetron (ZOFRAN) injection 4 mg (has no administration in time range)  0.9 %  sodium chloride infusion ( Intravenous Infusion Verify 07/11/22 1502)  LORazepam (ATIVAN) tablet 1-4 mg (has no administration in time range)    Or  LORazepam (ATIVAN) injection 1-4 mg (has no administration in time range)  sodium chloride 0.9 % 1,000 mL with thiamine [id number] mg, folic acid 1 mg, M.V.I. Adult 10 mL infusion ( Intravenous Infusion Verify 07/11/22 1502)  Oral care mouth rinse (has no administration in time range)  sodium chloride 0.9 % bolus 1,000 mL (0 mLs Intravenous Stopped 07/11/22 1115)  pantoprazole (PROTONIX) injection 40 mg (40 mg Intravenous Given 07/11/22 1048)  ondansetron (ZOFRAN) injection 4 mg (4 mg Intravenous Given  07/11/22 1048)  LORazepam (ATIVAN) injection 1 mg (1 mg Intravenous Given 07/11/22 1113)  cefTRIAXone (ROCEPHIN) 1 g in sodium chloride 0.9 % 100 mL IVPB (0 g Intravenous Stopped 07/11/22 1203)  iohexol (OMNIPAQUE) 300 MG/ML solution 75 mL (75 mLs Intravenous Contrast Given 07/11/22 1138)  iohexol (OMNIPAQUE) 350 MG/ML injection 75 mL (75 mLs Intravenous Contrast Given 07/11/22 1146)  metoCLOPramide (REGLAN) injection 10 mg (10 mg Intravenous Given 07/11/22 1314)  lactated ringers bolus 1,000 mL (0 mLs Intravenous Stopped 07/11/22 1200)     IMPRESSION / MDM / ASSESSMENT AND PLAN / ED COURSE  I reviewed the triage vital signs and the nursing notes.  Differential diagnosis includes, but is not limited to, GI bleed, ulcer, AVM, mass, SBO, perforation, lower GI bleed, cirrhosis  Patient presenting to the ER for evaluation of symptoms as described above.  Base on symptoms, risk factors and considered above differential, this presenting complaint could reflect a potentially life-threatening illness therefore the patient will be placed on continuous pulse oximetry and telemetry for monitoring.  Laboratory evaluation will be sent to evaluate for the above complaints.      Clinical Course as of 07/11/22 1512  Fri Jul 11, 2022  1119 Patient's hemoglobin critically low at 4.5.  Patient placed on Protonix infusion after receiving bolus.  We will give IV Rocephin.  INR normal.  Recent ultrasound did not show any findings of cirrhosis and endoscopy in 2022 did not show any varices.  I have consulted GI. [PR]  1127 I have ordered octreotide given his alcohol history and concern for possible varices given notes otherwise lower likelihood.  Did discuss case in consultation with Dr. Virgina Jock of GI who will evaluate patient but will need further medical stabilization prior to endoscopy.  Will consult hospitalist for admission. [PR]  1134 Chest x-ray on my review and interpretation without  evidence of pneumothorax or consolidation.  Patient did drop his blood pressure now this was after Ativan but in the setting of his acute anemia and suspected GI bleeding will give additional IV fluid resuscitation I do feel that he will need ICU level of care we will consult intensivist. [PR]    Clinical Course User Index [PR] Merlyn Lot, MD      FINAL CLINICAL IMPRESSION(S) / ED DIAGNOSES   Final diagnoses:  Symptomatic anemia  Gastrointestinal hemorrhage, unspecified gastrointestinal hemorrhage type     Rx / DC Orders   ED Discharge Orders     None        Note:  This document was prepared using Dragon voice recognition software and may include unintentional dictation errors.    Merlyn Lot, MD 07/11/22 [phone]

## 2022-07-11 NOTE — H&P (Signed)
NAME:  John Escobar, MRN:  [id number], DOB:  1955/11/19, LOS: 0 ADMISSION DATE:  07/11/2022, CONSULTATION DATE:  07/11/2022 REFERRING MD:  Dr. Quentin Cornwall, CHIEF COMPLAINT:  Coffee ground emesis   Brief Pt Description / Synopsis:  66 year old male with past medical history significant for alcohol abuse and peptic ulcer disease admitted with hemorrhagic shock in the setting of acute upper GI bleed.  GI is consulted.  Concern for developing DTs.  History of Present Illness:  John Escobar is a 66 year old male with a past medical history significant for chronic alcohol abuse, alcoholic hepatitis, dilated cardiomyopathy, chronic kidney disease stage IIIa, previous GI bleeds due to peptic ulcer disease and gastric AVM, gout, chronic NSAID use, anemia of chronic disease, hepatic steatosis who presents to [facility] ED on 07/11/2022 due to coffee-ground emesis.  He reports feeling unwell and short of breath for 1 week, along with what he describes as "dark chocolate" emesis, also noted some dark stool several days ago.  He does take Gabriel Earing powers 2 to 3 per day for joint pain, along with drinking 2 to 3 twelve ounce beers per day.  ED Course: Initial Vital Signs: Temperature 98.8 F orally, respiratory 18, pulse 122, blood pressure 120/94, SPO2 100% on room air Significant Labs: Hemoglobin 4.5, hematocrit 16.3, PT 14, INR 1.1, WBC 14.1, bicarb 19, glucose 154, BUN 41, creatinine 1.82, AST 57, ALT 46, ethyl alcohol less than 10 Imaging Chest X-ray>>IMPRESSION: No acute cardiopulmonary abnormality. CT angio abdomen pelvis>>IMPRESSION: 1. No acute findings in the abdomen or pelvis. No evidence of active bleeding into the gastrointestinal tract on arterial or venous phases. 2. No visualized gastrointestinal ulcer or lesion by CT. 3. Atherosclerosis of the abdominal aorta without evidence of aneurysm or dissection. 4. Replaced right hepatic artery. 5. Mild prostatic enlargement. 6. Degenerative disc disease  at L4-5. Medications Administered: 1 L LR bolus, 1 g IV Rocephin, 80 mg Protonix, Ativan  While in the ED patient became hypotensive with systolic blood pressures in the 70s.  Patient has just received 2 units of blood, 1 more unit is to infuse.  GI has been consulted and is currently evaluating patient in the ED, tentative plan for EGD tomorrow on 9/30.  PCCM asked to admit for further work-up and treatment.  Pertinent  Medical History   Past Medical History:  Diagnosis Date   CKD (chronic kidney disease), stage III ([place])    COVID-19 virus infection w/ Sepsis 11/2020   Dilated cardiomyopathy ([place])    a. 07/2021 Echo: EF 20-25%, GrII DD. Low-nl RV fxn. Nl RVSP.  Mildly dil LA. Mod MR/TR.   ETOH abuse    Gastritis    HFrEF (heart failure with reduced ejection fraction) ([place])    a. 07/2021 Echo: EF 20-25%.   Hiatal hernia    Iron deficiency anemia    PUD (peptic ulcer disease)      Micro Data:  9/29: MRSA PCR>> negative  Antimicrobials:  Ceftriaxone 9/29>>  Significant Hospital Events: Including procedures, antibiotic start and stop dates in addition to other pertinent events   9/29: PCCM asked to admit due to hemorrhagic shock in setting of acute GI bleed.  GI consulted.  Transfusing 3 units of blood.  Patient starting to develop DTs, implementing CIWA protocol.  Interim History / Subjective:  -Patient seen and examined in the ED -Was initially hypotensive with systolic blood pressures in the 70s, has just received 2 units of PRBCs with systolic improved to the low 100s, set to receive 1  more unit of PRBCs -Patient is awake, mental status fluctuates between agitation and lethargy (previously received Ativan due to developing DTs) -GI at bedside, plans for EGD tomorrow once medically optimized  Objective   Blood pressure 104/82, pulse 97, temperature 97.8 F (36.6 C), temperature source Axillary, resp. rate 18, weight 66.3 kg, SpO2 100 %.        Intake/Output Summary  (Last 24 hours) at 07/11/2022 1345 Last data filed at 07/11/2022 1317 Gross per 24 hour  Intake 4347.23 ml  Output --  Net 4347.23 ml   Filed Weights   07/11/22 1041  Weight: 66.3 kg    Examination: General: Acute on chronically ill-appearing male, sitting in bed, on room air, with mild shortness of breath and nausea HENT: Atraumatic, normocephalic, neck supple, no JVD, dry mucous membranes Lungs: Coarse breath sounds bilaterally, even, mild tachypnea Cardiovascular: Tachycardia, regular rhythm, S1-S2, no murmurs, rubs, gallops Abdomen: Soft, nontender, nondistended, no guarding rebound tenderness, bowel sounds positive x4 Extremities: No deformities, no edema, no cyanosis Neuro: Awake, oriented x3, mental status fluctuates between agitation and lethargy, moves all extremities to command, no focal deficits, speech clear, pupils PERRLA GU: Deferred  Resolved Hospital Problem list     Assessment & Plan:   Hemorrhagic shock in setting of acute GI bleed PMHx: HFrEF (EF 40-98% and g2 diastolic dysfunction with DCM) -Continuous cardiac monitoring -Maintain MAP >65 -Cautious IVF -Transfusions as indicated -Vasopressors as needed to maintain MAP goal -Trend lactic acid until normalized -Trend HS Troponin until peaked  Symptomatic anemia in the setting of acute Upper GI bleed PMHx: Alcoholic hepatitis, GI Bleed due to PUD and gastric AVM, Anemia of chronic disease CT angiogram of abdomen pelvis is negative -Monitor for S/Sx of bleeding -Trend CBC (H&H q6h) -SCD's for VTE Prophylaxis  -Transfuse for Hgb <8 -Protonix bolus given in ED, Protonix twice daily and octreotide infusion -Empiric ceftriaxone given history of cirrhosis -GI following, appreciate input ~tentative plan for EGD tomorrow on 9/30  Leukocytosis, suspect reactive High risk for aspiration Chest x-ray without evidence of infiltrate, UA is pending -Monitor fever curve -Trend WBC's  -Follow cultures as  above -Continue with empiric ceftriaxone given history of alcoholic hepatitis  Concern for Developing DT's PMHx: ETOH abuse -Provide supportive care -Aspiration and seizure precautions -CIWA protocol -Banana bag (then resume thiamine, folic acid, multivitamin)  CKD Stage IIIa -Monitor I&O's / urinary output -Follow BMP -Ensure adequate renal perfusion -Avoid nephrotoxic agents as able -Replace electrolytes as indicated  Hyperglycemia -CBG's q4h; Target range of 140 to 180 -SSI -Follow ICU Hypo/Hyperglycemia protocol    Patient is critically ill, high risk for aspiration, decompensation, cardiac arrest and death.   Best Practice (right click and "Reselect all SmartList Selections" daily)   Diet/type: NPO DVT prophylaxis: SCD GI prophylaxis: PPI Lines: N/A Foley:  N/A Code Status:  full code Last date of multidisciplinary goals of care discussion [N/A]  Labs   CBC: Recent Labs  Lab 07/11/22 1044  WBC 14.1*  NEUTROABS 11.4*  HGB 4.5*  HCT 16.3*  MCV 92.6  PLT 119    Basic Metabolic Panel: Recent Labs  Lab 07/11/22 1044  NA 139  K 4.0  CL 108  CO2 19*  GLUCOSE 154*  BUN 41*  CREATININE 1.82*  CALCIUM 8.9   GFR: Estimated Creatinine Clearance: 37.4 mL/min (A) (by C-G formula based on SCr of 1.82 mg/dL (H)). Recent Labs  Lab 07/11/22 1044  WBC 14.1*    Liver Function Tests: Recent Labs  Lab  07/11/22 1044  AST 57*  ALT 46*  ALKPHOS 61  BILITOT 0.6  PROT 7.2  ALBUMIN 3.4*   Recent Labs  Lab 07/11/22 1044  LIPASE 48   No results for input(s): "AMMONIA" in the last 168 hours.  ABG    Component Value Date/Time   HCO3 16.6 (L) 08/01/2021 0832   ACIDBASEDEF 7.6 (H) 08/01/2021 0832   O2SAT 85.6 08/01/2021 0832     Coagulation Profile: Recent Labs  Lab 07/11/22 1044  INR 1.1    Cardiac Enzymes: No results for input(s): "CKTOTAL", "CKMB", "CKMBINDEX", "TROPONINI" in the last 168 hours.  HbA1C: No results found for:  "HGBA1C"  CBG: No results for input(s): "GLUCAP" in the last 168 hours.  Review of Systems:   Positives in BOLD: Gen: Denies fever, chills, weight change, fatigue, night sweats HEENT: Denies blurred vision, double vision, hearing loss, tinnitus, sinus congestion, rhinorrhea, sore throat, neck stiffness, dysphagia PULM: Denies shortness of breath, cough, sputum production, hemoptysis, wheezing CV: Denies chest pain, edema, orthopnea, paroxysmal nocturnal dyspnea, palpitations GI: Denies abdominal pain, nausea, vomiting, diarrhea, hematochezia, melena, constipation, change in bowel habits GU: Denies dysuria, hematuria, polyuria, oliguria, urethral discharge Endocrine: Denies hot or cold intolerance, polyuria, polyphagia or appetite change Derm: Denies rash, dry skin, scaling or peeling skin change Heme: Denies easy bruising, bleeding, bleeding gums Neuro: Denies headache, numbness, weakness, slurred speech, loss of memory or consciousness   Past Medical History:  He,  has a past medical history of CKD (chronic kidney disease), stage III ([place]), COVID-19 virus infection w/ Sepsis (11/2020), Dilated cardiomyopathy ([place]), ETOH abuse, Gastritis, HFrEF (heart failure with reduced ejection fraction) ([place]), Hiatal hernia, Iron deficiency anemia, and PUD (peptic ulcer disease).   Surgical History:   Past Surgical History:  Procedure Laterality Date   ELBOW BURSA SURGERY     ESOPHAGOGASTRODUODENOSCOPY (EGD) WITH PROPOFOL N/A 11/29/2020   Procedure: ESOPHAGOGASTRODUODENOSCOPY (EGD) WITH PROPOFOL;  Surgeon: Toledo, Benay Pike, MD;  Location: [facility] ENDOSCOPY;  Service: Gastroenterology;  Laterality: N/A;   SHOULDER SURGERY Left      Social History:   reports that he has never smoked. He has never used smokeless tobacco. He reports current alcohol use. He reports that he does not currently use drugs.   Family History:  His family history is not on file.   Allergies No Known Allergies   Home  Medications  Prior to Admission medications   Medication Sig Start Date End Date Taking? Authorizing Provider  acetaminophen (TYLENOL) 650 MG CR tablet Take 650 mg by mouth every 8 (eight) hours as needed for pain.   Yes [provider]  Aspirin-Acetaminophen-Caffeine (GOODY HEADACHE PO) Take 1 Package by mouth daily as needed.   Yes [provider]  bismuth subsalicylate (PEPTO BISMOL) 262 MG/15ML suspension Take 30 mLs by mouth every 6 (six) hours as needed.   Yes [provider]  furosemide (LASIX) 40 MG tablet Take 1 tablet (40 mg total) by mouth daily. Patient not taking: Reported on 03/20/2022 08/06/21   Sharen Hones, MD  iron polysaccharides (NIFEREX) 150 MG capsule Take 1 capsule (150 mg total) by mouth daily. Patient not taking: Reported on 07/11/2022 03/28/22   Patrecia Pour, MD  lisinopril (ZESTRIL) 2.5 MG tablet Take 1 tablet (2.5 mg total) by mouth daily. Patient not taking: Reported on 03/20/2022 08/06/21   Sharen Hones, MD  metoprolol succinate (TOPROL-XL) 25 MG 24 hr tablet Take 0.5 tablets (12.5 mg total) by mouth daily. Patient not taking: Reported on 03/20/2022 08/06/21  Sharen Hones, MD  pantoprazole (PROTONIX) 40 MG tablet Take 1 tablet (40 mg total) by mouth 2 (two) times daily. Patient not taking: Reported on 07/11/2022 03/28/22   Patrecia Pour, MD  simethicone (MYLICON) 80 MG chewable tablet Chew 1 tablet (80 mg total) by mouth every 6 (six) hours as needed for flatulence. Patient not taking: Reported on 03/20/2022 08/06/21   Sharen Hones, MD     Critical care time: 50 minutes     Darel Hong, AGACNP-BC [facility] Prefer epic messenger for cross cover needs If after hours, please call E-link

## 2022-07-12 ENCOUNTER — Other Ambulatory Visit: Payer: Self-pay

## 2022-07-12 ENCOUNTER — Inpatient Hospital Stay: Payer: Medicare HMO | Admitting: General Practice

## 2022-07-12 ENCOUNTER — Encounter: Payer: Self-pay | Admitting: Pulmonary Disease

## 2022-07-12 ENCOUNTER — Encounter
Admission: EM | Disposition: A | Discharge status: Home / Self Care | Payer: Self-pay | Source: Home / Self Care | Attending: Pulmonary Disease

## 2022-07-12 HISTORY — PX: ESOPHAGOGASTRODUODENOSCOPY (EGD) WITH PROPOFOL: SHX5813

## 2022-07-12 LAB — CBC WITH DIFFERENTIAL/PLATELET
Abs Immature Granulocytes: 0.09 10*3/uL — ABNORMAL HIGH (ref 0.00–0.07)
Basophils Absolute: 0 10*3/uL (ref 0.0–0.1)
Basophils Relative: 0 %
Eosinophils Absolute: 0 10*3/uL (ref 0.0–0.5)
Eosinophils Relative: 0 %
HCT: 23 % — ABNORMAL LOW (ref 39.0–52.0)
Hemoglobin: 7.2 g/dL — ABNORMAL LOW (ref 13.0–17.0)
Immature Granulocytes: 1 %
Lymphocytes Relative: 4 %
Lymphs Abs: 0.5 10*3/uL — ABNORMAL LOW (ref 0.7–4.0)
MCH: 27.7 pg (ref 26.0–34.0)
MCHC: 31.3 g/dL (ref 30.0–36.0)
MCV: 88.5 fL (ref 80.0–100.0)
Monocytes Absolute: 0.9 10*3/uL (ref 0.1–1.0)
Monocytes Relative: 7 %
Neutro Abs: 11 10*3/uL — ABNORMAL HIGH (ref 1.7–7.7)
Neutrophils Relative %: 88 %
Platelets: 239 10*3/uL (ref 150–400)
RBC: 2.6 MIL/uL — ABNORMAL LOW (ref 4.22–5.81)
RDW: 18.2 % — ABNORMAL HIGH (ref 11.5–15.5)
WBC: 12.6 10*3/uL — ABNORMAL HIGH (ref 4.0–10.5)
nRBC: 0.2 % (ref 0.0–0.2)

## 2022-07-12 LAB — BASIC METABOLIC PANEL
Anion gap: 11 (ref 5–15)
BUN: 30 mg/dL — ABNORMAL HIGH (ref 8–23)
CO2: 14 mmol/L — ABNORMAL LOW (ref 22–32)
Calcium: 8 mg/dL — ABNORMAL LOW (ref 8.9–10.3)
Chloride: 117 mmol/L — ABNORMAL HIGH (ref 98–111)
Creatinine, Ser: 1.64 mg/dL — ABNORMAL HIGH (ref 0.61–1.24)
GFR, Estimated: 46 mL/min — ABNORMAL LOW (ref >60)
Glucose, Bld: 114 mg/dL — ABNORMAL HIGH (ref 70–99)
Potassium: 4.4 mmol/L (ref 3.5–5.1)
Sodium: 142 mmol/L (ref 135–145)

## 2022-07-12 LAB — HEMOGLOBIN AND HEMATOCRIT, BLOOD
HCT: 26.4 % — ABNORMAL LOW (ref 39.0–52.0)
HCT: 25.5 % — ABNORMAL LOW (ref 39.0–52.0)
HCT: 26.1 % — ABNORMAL LOW (ref 39.0–52.0)
Hemoglobin: 8.4 g/dL — ABNORMAL LOW (ref 13.0–17.0)
Hemoglobin: 8 g/dL — ABNORMAL LOW (ref 13.0–17.0)
Hemoglobin: 8.1 g/dL — ABNORMAL LOW (ref 13.0–17.0)

## 2022-07-12 LAB — IRON AND TIBC
Iron: 16 ug/dL — ABNORMAL LOW (ref 45–182)
Saturation Ratios: 3 % — ABNORMAL LOW (ref 17.9–39.5)
TIBC: 514 ug/dL — ABNORMAL HIGH (ref 250–450)
UIBC: 498 ug/dL

## 2022-07-12 LAB — MAGNESIUM: Magnesium: 1.9 mg/dL (ref 1.7–2.4)

## 2022-07-12 LAB — PROTIME-INR
INR: 1.1 (ref 0.8–1.2)
Prothrombin Time: 14 seconds (ref 11.4–15.2)

## 2022-07-12 LAB — TRANSFERRIN: Transferrin: 369 mg/dL — ABNORMAL HIGH (ref 180–329)

## 2022-07-12 LAB — FOLATE: Folate: 33 ng/mL (ref >5.9)

## 2022-07-12 LAB — APTT: aPTT: 25 seconds (ref 24–36)

## 2022-07-12 LAB — FERRITIN: Ferritin: 8 ng/mL — ABNORMAL LOW (ref 24–336)

## 2022-07-12 LAB — PHOSPHORUS: Phosphorus: 4.8 mg/dL — ABNORMAL HIGH (ref 2.5–4.6)

## 2022-07-12 LAB — VITAMIN B12: Vitamin B-12: 575 pg/mL (ref 180–914)

## 2022-07-12 SURGERY — ESOPHAGOGASTRODUODENOSCOPY (EGD) WITH PROPOFOL
Anesthesia: Monitor Anesthesia Care

## 2022-07-12 MED ORDER — PHENYLEPHRINE 80 MCG/ML (10ML) SYRINGE FOR IV PUSH (FOR BLOOD PRESSURE SUPPORT)
PREFILLED_SYRINGE | INTRAVENOUS | Status: AC
  Filled 2022-07-12: qty 10

## 2022-07-12 MED ORDER — THIAMINE HCL 100 MG/ML IJ SOLN
100.0000 mg | Freq: Every day | INTRAMUSCULAR | Status: DC
  Administered 2022-07-12: 100 mg via INTRAVENOUS
  Filled 2022-07-12 (×2): qty 2

## 2022-07-12 MED ORDER — PROSIGHT PO TABS
1.0000 | ORAL_TABLET | Freq: Every day | ORAL | Status: DC
  Filled 2022-07-12: qty 1

## 2022-07-12 MED ORDER — MIDAZOLAM HCL 2 MG/2ML IJ SOLN
INTRAMUSCULAR | Status: AC
  Filled 2022-07-12: qty 2

## 2022-07-12 MED ORDER — OCUVITE-LUTEIN PO CAPS
1.0000 | ORAL_CAPSULE | Freq: Every day | ORAL | Status: DC
  Administered 2022-07-12 – 2022-07-13 (×2): 1 via ORAL
  Filled 2022-07-12 (×2): qty 1

## 2022-07-12 MED ORDER — CHLORHEXIDINE GLUCONATE CLOTH 2 % EX PADS
6.0000 | MEDICATED_PAD | Freq: Every day | CUTANEOUS | Status: DC
  Administered 2022-07-12: 6 via TOPICAL

## 2022-07-12 MED ORDER — DIAZEPAM 5 MG/ML IJ SOLN
2.5000 mg | Freq: Four times a day (QID) | INTRAMUSCULAR | Status: DC
  Administered 2022-07-12 – 2022-07-13 (×5): 2.5 mg via INTRAVENOUS
  Filled 2022-07-12 (×5): qty 2

## 2022-07-12 MED ORDER — PROPOFOL 500 MG/50ML IV EMUL
INTRAVENOUS | Status: DC | PRN
  Administered 2022-07-12: 50 ug/kg/min via INTRAVENOUS

## 2022-07-12 MED ORDER — PROPOFOL 10 MG/ML IV BOLUS
INTRAVENOUS | Status: DC | PRN
  Administered 2022-07-12: 20 mg via INTRAVENOUS

## 2022-07-12 MED ORDER — FOLIC ACID 1 MG PO TABS
1.0000 mg | ORAL_TABLET | Freq: Every day | ORAL | Status: DC
  Administered 2022-07-12 – 2022-07-13 (×2): 1 mg via ORAL
  Filled 2022-07-12 (×2): qty 1

## 2022-07-12 MED ORDER — PROPOFOL 10 MG/ML IV BOLUS
INTRAVENOUS | Status: AC
  Filled 2022-07-12: qty 40

## 2022-07-12 MED ORDER — SODIUM CHLORIDE 0.9% IV SOLUTION: Freq: Once | INTRAVENOUS | Status: AC

## 2022-07-12 MED ORDER — MIDAZOLAM HCL 2 MG/2ML IJ SOLN
INTRAMUSCULAR | Status: DC | PRN
  Administered 2022-07-12 (×2): 1 mg via INTRAVENOUS

## 2022-07-12 MED ORDER — PHENYLEPHRINE HCL (PRESSORS) 10 MG/ML IV SOLN
INTRAVENOUS | Status: DC | PRN
  Administered 2022-07-12: 80 ug via INTRAVENOUS

## 2022-07-12 MED ORDER — LIDOCAINE HCL (PF) 2 % IJ SOLN
INTRAMUSCULAR | Status: AC
  Filled 2022-07-12: qty 5

## 2022-07-12 NOTE — Consult Note (Signed)
[facility] for Electrolyte Monitoring and Replacement   Recent Labs: Potassium (mmol/L)  Date Value  07/11/2022 4.0  09/07/2012 4.0   Magnesium (mg/dL)  Date Value  03/24/2022 2.3  04/28/2012 2.5 (H)   Calcium (mg/dL)  Date Value  07/11/2022 8.9   Calcium, Total (mg/dL)  Date Value  09/07/2012 8.8   Albumin (g/dL)  Date Value  07/11/2022 3.4 (L)  09/06/2012 4.4   Phosphorus (mg/dL)  Date Value  03/24/2022 3.9   Sodium (mmol/L)  Date Value  07/11/2022 139  09/07/2012 137   Assessment: Patient is a 66 y/o M with medical history including EtOH use disorder, alcoholic hepatitis, dilated cardiomyopathy, CKD, GIB d/t PUD and gastric AVM treated with APC 2022, tophaceous gout, chronic NSAID use, IDA, hepatic steatosis who presented to the ED 9/29 with coffee ground emesis and severe anemia. Pharmacy consulted to assist with electrolyte monitoring and replacement as indicated.  Scr 1.82 on admission, appears overall consistent with baseline  Goal of Therapy:  Electrolytes within normal limits  Plan:  --No electrolyte replacement indicated at this time --Follow-up electrolytes with AM labs tomorrow  Pearla Dubonnet 07/12/2022 8:06 AM

## 2022-07-12 NOTE — Progress Notes (Signed)
Inpatient Follow-up/Progress Note   Patient ID: John Escobar is a 66 y.o. male.  Overnight Events / Subjective Findings Pt's hgb improved to 8.4 o/n after 3 u prbc, but downtrended to 7.2 and he is receiving at 4th unit at time of eval. Still with some tachycardia. Pt is tired, but able to answer questions appropriately and states he only wants something to drink and to be able to sleep.  He continues to decline colonoscopy, even if upper endoscopy is negative. Plan for egd today. Npo since midnight.  Review of Systems  Constitutional:  Positive for fatigue. Negative for activity change, appetite change, chills, diaphoresis, fever and unexpected weight change.  HENT:  Negative for trouble swallowing and voice change.   Respiratory:  Positive for shortness of breath. Negative for wheezing.   Cardiovascular:  Negative for chest pain, palpitations and leg swelling.  Gastrointestinal:  Positive for blood in stool (melena) and nausea. Negative for abdominal distention, abdominal pain, anal bleeding, constipation, diarrhea and vomiting.  Musculoskeletal:  Positive for arthralgias. Negative for myalgias.  Skin:  Positive for pallor. Negative for color change.  Neurological:  Positive for dizziness, weakness and light-headedness. Negative for syncope.  Psychiatric/Behavioral:  Negative for confusion. The patient is not nervous/anxious.   All other systems reviewed and are negative.    Medications  Current Facility-Administered Medications:    0.9 %  sodium chloride infusion, 10 mL/hr, Intravenous, Once, Merlyn Lot, MD, Stopped at 07/12/22 0518   0.9 %  sodium chloride infusion, , Intravenous, Continuous, Annamaria Helling, DO, Last Rate: 20 mL/hr at 07/12/22 0518, Restarted at 07/12/22 0518   cefTRIAXone (ROCEPHIN) 1 g in sodium chloride 0.9 % 100 mL IVPB, 1 g, Intravenous, Q24H, Annamaria Helling, DO   Chlorhexidine Gluconate Cloth 2 % PADS 6 each, 6 each, Topical, Daily,  Chawla, Harsh, MD   docusate sodium (COLACE) capsule 100 mg, 100 mg, Oral, BID PRN, Chawla, Harsh, MD   LORazepam (ATIVAN) tablet 1-4 mg, 1-4 mg, Oral, Q1H PRN **OR** LORazepam (ATIVAN) injection 1-4 mg, 1-4 mg, Intravenous, Q1H PRN, Chawla, Harsh, MD, 2 mg at 07/11/22 2249   [COMPLETED] octreotide (SANDOSTATIN) 2 mcg/mL load via infusion 50 mcg, 50 mcg, Intravenous, Once, 50 mcg at 07/11/22 1308 **AND** octreotide (SANDOSTATIN) 500 mcg in sodium chloride 0.9 % 250 mL (2 mcg/mL) infusion, 50 mcg/hr, Intravenous, Continuous, Merlyn Lot, MD, Last Rate: 25 mL/hr at 07/12/22 0500, 50 mcg/hr at 07/12/22 0500   ondansetron (ZOFRAN) injection 4 mg, 4 mg, Intravenous, Q6H PRN, Chawla, Harsh, MD, 4 mg at 07/12/22 0518   Oral care mouth rinse, 15 mL, Mouth Rinse, PRN, Chawla, Harsh, MD   pantoprazole (PROTONIX) injection 40 mg, 40 mg, Intravenous, Q12H, Annamaria Helling, DO, 40 mg at 07/11/22 2038   polyethylene glycol (MIRALAX / GLYCOLAX) packet 17 g, 17 g, Oral, Daily PRN, Chawla, Harsh, MD   sodium chloride 0.9 % 1,000 mL with thiamine 086 mg, folic acid 1 mg, M.V.I. Adult 10 mL infusion, , Intravenous, Daily, Chawla, Harsh, MD, Stopped at 07/11/22 2255  sodium chloride Stopped (07/12/22 0518)   sodium chloride 20 mL/hr at 07/12/22 0518   cefTRIAXone (ROCEPHIN)  IV     octreotide (SANDOSTATIN) 500 mcg in sodium chloride 0.9 % 250 mL (2 mcg/mL) infusion 50 mcg/hr (07/12/22 0500)   sodium chloride 0.9 % 1,000 mL with thiamine 761 mg, folic acid 1 mg, M.V.I. Adult 10 mL infusion Stopped (07/11/22 2255)    docusate sodium, LORazepam **OR** LORazepam, ondansetron (ZOFRAN)  IV, mouth rinse, polyethylene glycol   Objective    Vitals:   07/12/22 0200 07/12/22 0300 07/12/22 0400 07/12/22 0500  BP: 127/86 100/83 125/88 (!) 124/91  Pulse: (!) 107 (!) 108 (!) 108   Resp: (!) 25 (!) 25 (!) 25 (!) 28  Temp:      TempSrc:      SpO2: 92% 91% 92% 96%  Weight:    69.6 kg  Height:         Physical  Exam Vitals and nursing note reviewed.  Constitutional:      General: He is not in acute distress.    Appearance: He is ill-appearing. He is not toxic-appearing or diaphoretic.  HENT:     Head: Normocephalic and atraumatic.     Nose: Nose normal.     Mouth/Throat:     Mouth: Mucous membranes are dry.     Pharynx: Oropharynx is clear.  Eyes:     General: No scleral icterus.    Extraocular Movements: Extraocular movements intact.  Cardiovascular:     Rate and Rhythm: Regular rhythm. Tachycardia present.     Heart sounds: No murmur heard.    No friction rub. Gallop present.  Pulmonary:     Effort: Pulmonary effort is normal. No respiratory distress.     Breath sounds: Normal breath sounds. No wheezing, rhonchi or rales.  Abdominal:     General: Bowel sounds are normal. There is no distension.     Palpations: Abdomen is soft.     Tenderness: There is no abdominal tenderness. There is no guarding or rebound.  Musculoskeletal:     Cervical back: Neck supple.     Right lower leg: No edema.     Left lower leg: No edema.     Comments: Gouty changes to multiple joints  Skin:    General: Skin is warm and dry.     Coloration: Skin is pale. Skin is not jaundiced.  Neurological:     Mental Status: He is alert and oriented to person, place, and time. Mental status is at baseline.      Laboratory Data Recent Labs  Lab 07/11/22 1044 07/11/22 1950 07/12/22 0101  WBC 14.1*  --   --   HGB 4.5* 8.0* 8.4*  HCT 16.3* 25.4* 26.4*  PLT 383  --   --   NEUTOPHILPCT 81  --   --   LYMPHOPCT 10  --   --   MONOPCT 7  --   --   EOSPCT 1  --   --    Recent Labs  Lab 07/11/22 1044  NA 139  K 4.0  CL 108  CO2 19*  BUN 41*  CREATININE 1.82*  CALCIUM 8.9  PROT 7.2  BILITOT 0.6  ALKPHOS 61  ALT 46*  AST 57*  GLUCOSE 154*   Recent Labs  Lab 07/11/22 1044 07/12/22 0502  INR 1.1 1.1      Imaging Studies: CT Angio Abd/Pel w/ and/or w/o  Result Date: 07/11/2022 CLINICAL DATA:   Nausea, vomiting and anemia. History of gastric ulcer. EXAM: CT ANGIOGRAPHY ABDOMEN AND PELVIS WITH CONTRAST AND WITHOUT CONTRAST TECHNIQUE: Multidetector CT imaging of the abdomen and pelvis was performed using the standard protocol during bolus administration of intravenous contrast. Multiplanar reconstructed images and MIPs were obtained and reviewed to evaluate the vascular anatomy. RADIATION DOSE REDUCTION: This exam was performed according to the departmental dose-optimization program which includes automated exposure control, adjustment of the mA and/or kV according to patient  size and/or use of iterative reconstruction technique. CONTRAST:  73mL OMNIPAQUE IOHEXOL 350 MG/ML SOLN, 81mL OMNIPAQUE IOHEXOL 300 MG/ML SOLN COMPARISON:  None Available. FINDINGS: VASCULAR Aorta: Atherosclerosis of the abdominal aorta without evidence of aneurysm or dissection. Celiac: Normally patent.  Normally patent visualized branch vessels. SMA: Normally patent.  Replaced right hepatic artery. Renals: Minimal atherosclerosis of bilateral single renal arteries. No evidence of renal artery stenosis. IMA: Normally patent. Inflow: Normally patent bilateral iliac arteries without stenosis or aneurysm. Proximal Outflow: Normally patent bilateral common femoral arteries and femoral bifurcations. Veins: Venous phase imaging demonstrates normal patency of venous structures in the abdomen and pelvis. No evidence of thrombus. No venous anatomic variants. Review of the MIP images confirms the above findings. NON-VASCULAR Lower chest: No acute abnormality. Hepatobiliary: No focal liver abnormality is seen. No gallstones, gallbladder wall thickening, or biliary dilatation. Pancreas: Unremarkable. No pancreatic ductal dilatation or surrounding inflammatory changes. Spleen: Normal in size without focal abnormality. Adrenals/Urinary Tract: Adrenal glands are unremarkable. Kidneys are normal, without renal calculi, focal lesion, or hydronephrosis.  Bladder is unremarkable. Stomach/Bowel: Bowel shows no evidence of obstruction, ileus, inflammation or lesion. No ulcers identified. No evidence active bleeding into the gastrointestinal tract on arterial or venous phases. Mild scattered diverticula of the colon without evidence of diverticulitis. The appendix is normal. No free intraperitoneal air. Lymphatic: No enlarged abdominal or pelvic lymph nodes. Reproductive: Mild prostatic enlargement. Other: No abdominal wall hernia or abnormality. No abdominopelvic ascites. Musculoskeletal: Degenerative disc disease at L4-5. IMPRESSION: 1. No acute findings in the abdomen or pelvis. No evidence of active bleeding into the gastrointestinal tract on arterial or venous phases. 2. No visualized gastrointestinal ulcer or lesion by CT. 3. Atherosclerosis of the abdominal aorta without evidence of aneurysm or dissection. 4. Replaced right hepatic artery. 5. Mild prostatic enlargement. 6. Degenerative disc disease at L4-5. Electronically Signed   By: Aletta Edouard M.D.   On: 07/11/2022 12:25   DG Chest Portable 1 View  Result Date: 07/11/2022 CLINICAL DATA:  66 year old male with vomiting. Sick for a week. Decreased p.o. EXAM: PORTABLE CHEST 1 VIEW COMPARISON:  Portable chest 03/20/2022 and earlier. FINDINGS: Portable AP semi upright view at 1050 hours. Similar low normal lung volumes. Normal cardiac size and mediastinal contours. Visualized tracheal air column is within normal limits. Allowing for portable technique the lungs are clear. Negative visible bowel gas. No pneumoperitoneum identified beneath the diaphragm. No acute osseous abnormality identified. IMPRESSION: No acute cardiopulmonary abnormality. Electronically Signed   By: Genevie Ann M.D.   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 Likely high risk for sedation.  Protonix 40 mg iv q12 h Octreotide bolus and gtt for 72 hours total Ceftriaxone 1 g iv q24 for 7 days total Monitor H&H.  Transfusion and resuscitation as per primary team Avoid frequent lab draws to prevent lab induced anemia Supportive care and antiemetics as per primary team Maintain two sites IV access Avoid nsaids Monitor for GIB. CIWA protocol; Counseled on etoh cessation Pt continues to decline colonoscopy. Informed that if upper endoscopy is negative, we may not be able to know if he has another source of bleeding or a malignancy, however, the pt still declines  Recommend iv iron infusion while the patient is in the hospital. PO iron as outpatient if pt willing to take.  Further recommendations pending endoscopy. Please see op report for further details   Esophagogastroduodenoscopy with possible biopsy, control of bleeding, polypectomy, and interventions as necessary has been discussed with the patient/patient representative. Informed consent was obtained from the patient/patient representative after  explaining the indication, nature, and risks of the procedure including but not limited to death, bleeding, perforation, missed neoplasm/lesions, cardiorespiratory compromise, and reaction to medications. Opportunity for questions was given and appropriate answers were provided. Patient/patient representative has verbalized understanding is amenable to undergoing the procedure.  I personally performed the service.  Management of other medical comorbidities as per primary team  Thank you for allowing [place] to participate in this patient's care. Please don't hesitate to call if any questions or concerns arise.   Annamaria Helling, DO [facility]  Portions of the record may have been created with voice recognition software. Occasional wrong-word or 'sound-a-like' substitutions may have occurred due to the inherent limitations of voice recognition software.  Read the chart carefully and recognize, using context, where substitutions may have occurred.

## 2022-07-12 NOTE — Progress Notes (Addendum)
NAME:  John Escobar, MRN:  [id number], DOB:  [birth year], LOS: 1 ADMISSION DATE:  07/11/2022, CONSULTATION DATE:  07/11/2022 REFERRING MD:  Dr. Quentin Cornwall, CHIEF COMPLAINT:  Coffee ground emesis   Brief Pt Description / Synopsis:  66 year old male with past medical history significant for alcohol abuse and peptic ulcer disease admitted with hemorrhagic shock in the setting of acute upper GI bleed.  GI is consulted.  Concern for developing DTs.  EGD 07/12/22 with non-bleeding duodenal ulcers, and non-bleeding esophagitis/gastritis.  History of Present Illness:  John Escobar is a 66 year old male with a past medical history significant for chronic alcohol abuse, alcoholic hepatitis, dilated cardiomyopathy, chronic kidney disease stage IIIa, previous GI bleeds due to peptic ulcer disease and gastric AVM, gout, chronic NSAID use, anemia of chronic disease, hepatic steatosis who presents to [facility] ED on 07/11/2022 due to coffee-ground emesis.  He reports feeling unwell and short of breath for 1 week, along with what he describes as "dark chocolate" emesis, also noted some dark stool several days ago.  He does take Gabriel Earing powers 2 to 3 per day for joint pain, along with drinking 2 to 3 twelve ounce beers per day.  ED Course: Initial Vital Signs: Temperature 98.8 F orally, respiratory 18, pulse 122, blood pressure 120/94, SPO2 100% on room air Significant Labs: Hemoglobin 4.5, hematocrit 16.3, PT 14, INR 1.1, WBC 14.1, bicarb 19, glucose 154, BUN 41, creatinine 1.82, AST 57, ALT 46, ethyl alcohol less than 10 Imaging Chest X-ray>>IMPRESSION: No acute cardiopulmonary abnormality. CT angio abdomen pelvis>>IMPRESSION: 1. No acute findings in the abdomen or pelvis. No evidence of active bleeding into the gastrointestinal tract on arterial or venous phases. 2. No visualized gastrointestinal ulcer or lesion by CT. 3. Atherosclerosis of the abdominal aorta without evidence of aneurysm or dissection. 4.  Replaced right hepatic artery. 5. Mild prostatic enlargement. 6. Degenerative disc disease at L4-5. Medications Administered: 1 L LR bolus, 1 g IV Rocephin, 80 mg Protonix, Ativan  While in the ED patient became hypotensive with systolic blood pressures in the 70s.  Patient has just received 2 units of blood, 1 more unit is to infuse.  GI has been consulted and is currently evaluating patient in the ED, tentative plan for EGD tomorrow on 9/30.  PCCM asked to admit for further work-up and treatment.  Pertinent  Medical History   Past Medical History:  Diagnosis Date   CKD (chronic kidney disease), stage III ([place])    COVID-19 virus infection w/ Sepsis 11/2020   Dilated cardiomyopathy ([place])    a. 07/2021 Echo: EF 20-25%, GrII DD. Low-nl RV fxn. Nl RVSP.  Mildly dil LA. Mod MR/TR.   ETOH abuse    Gastritis    HFrEF (heart failure with reduced ejection fraction) ([place])    a. 07/2021 Echo: EF 20-25%.   Hiatal hernia    Iron deficiency anemia    PUD (peptic ulcer disease)      Micro Data:  9/29: MRSA PCR>> negative  Antimicrobials:  Ceftriaxone 9/29>>9/30  Significant Hospital Events: Including procedures, antibiotic start and stop dates in addition to other pertinent events   9/29: PCCM asked to admit due to hemorrhagic shock in setting of acute GI bleed.  GI consulted.  Transfusing 3 units of blood.  Patient starting to develop DTs, implementing CIWA protocol. 9/30: Hgb decreased this am to 7.2, will give 1 unit of pRBC's and 1 FFP.  Mild DT's currently controlled with prn Ativan via CIWA protocol, add low dose scheduled  valium.  EGD with nonbleeding duodenal ulcers, esophagitis and gastritis with no bleeding.  Octreotide and Rocephin discontinued.  Interim History / Subjective:  -No significant events noted overnight -Afebrile, hemodynamically stable, no pressors, on room air -Hgb decreased this am to 7.2 from 8.4 (received 3 units pRBC's yesterday) ~ will give 1 unit pRBC today  and 1 FFP -GI planning for EGD today -CMP results pending -With mild DT's, currently controlled with PRN Ativan via CIWA protocol ~ pt resting in bed, however when awakened  aroused for exam became agitated, confused, trying to get out of bed and difficult to redirect   Objective   Blood pressure 124/87, pulse (!) 109, temperature 98.7 F (37.1 C), temperature source Oral, resp. rate 20, height 5\' 9"  (1.753 m), weight 69.6 kg, SpO2 94 %.        Intake/Output Summary (Last 24 hours) at 07/12/2022 [id number] Last data filed at 07/12/2022 0800 Gross per 24 hour  Intake 7231.78 ml  Output 425 ml  Net 6806.78 ml    Filed Weights   07/11/22 1041 07/12/22 0500  Weight: 66.3 kg 69.6 kg    Examination: General: Acute on chronically ill-appearing male, sitting in bed, on room air, in NAD HENT: Atraumatic, normocephalic, neck supple, no JVD, dry mucous membranes Lungs: Clear breath sounds bilaterally, even, nonlabored Cardiovascular: Tachycardia, regular rhythm, S1-S2, no murmurs, rubs, gallops Abdomen: Soft, nontender, nondistended, no guarding rebound tenderness, bowel sounds positive x4 Extremities: No deformities, no edema, no cyanosis Neuro: Sleeping s/p Ativan, arouses to gentle stimulation but becomes agitated and confused, moves all extremities purposefully but not to command, pupils PERRLA GU: Deferred  Resolved Hospital Problem list     Assessment & Plan:   Hemorrhagic shock in setting of acute GI bleed ~ RESOLVED PMHx: HFrEF (Echo 08/01/21: EF 0000000, g2 diastolic dysfunction with DCM, Pulmonary hypertension, moderate MR and TR) -Continuous cardiac monitoring -Maintain MAP >65 -Cautious IVF -Transfusions as indicated -Vasopressors as needed to maintain MAP goal -Trend lactic acid until normalized -Trend HS Troponin until peaked -Diuresis as BP and renal function permits  Symptomatic anemia in the setting of acute Upper GI bleed PMHx: Alcoholic hepatitis, GI Bleed due to  PUD and gastric AVM, Anemia of chronic disease CT angiogram of abdomen pelvis is negative EGD 07/12/22 with nonbleeding ulcers, nonbleeding esophagitis/gastritis. -Monitor for S/Sx of bleeding -Trend CBC (H&H q6h) -SCD's for VTE Prophylaxis  -Transfuse for Hgb <8 ~ will give 1 unit pRBC's and 1 FFP today 9/30 -GI following, appreciate input ~ Recommendations post EGD: Low residual diet Protonix 40 mg PO BID for 8 weeks Repeat EGD in 8 weeks No aspirin or NSAIDS Discontinue Octreotide and Rocephin as no signs of portal HTN on endoscopy  Leukocytosis, suspect reactive ~ IMPROVING High risk for aspiration Chest x-ray without evidence of infiltrate, UA is pending -Monitor fever curve -Trend WBC's  -Follow cultures as above  Concern for Developing DT's PMHx: ETOH abuse -Provide supportive care -Aspiration and seizure precautions -CIWA protocol -Add low dose Valium 2.5 mg [id number] -Thiamine, folic acid, multivitamin  CKD Stage IIIa -Monitor I&O's / urinary output -Follow BMP -Ensure adequate renal perfusion -Avoid nephrotoxic agents as able -Replace electrolytes as indicated  Hyperglycemia -CBG's q4h; Target range of 140 to 180 -SSI -Follow ICU Hypo/Hyperglycemia protocol    Patient is critically ill, high risk for aspiration, decompensation, cardiac arrest and death.   Best Practice (right click and "Reselect all SmartList Selections" daily)   Diet/type: NPO DVT prophylaxis: SCD GI prophylaxis: PPI  Lines: N/A Foley:  N/A Code Status:  full code Last date of multidisciplinary goals of care discussion [N/A]  Labs   CBC: Recent Labs  Lab 07/11/22 1044 07/11/22 1950 07/12/22 0101 07/12/22 0723  WBC 14.1*  --   --  12.6*  NEUTROABS 11.4*  --   --  11.0*  HGB 4.5* 8.0* 8.4* 7.2*  HCT 16.3* 25.4* 26.4* 23.0*  MCV 92.6  --   --  88.5  PLT 383  --   --  239     Basic Metabolic Panel: Recent Labs  Lab 07/11/22 1044  NA 139  K 4.0  CL 108  CO2 19*  GLUCOSE  154*  BUN 41*  CREATININE 1.82*  CALCIUM 8.9    GFR: Estimated Creatinine Clearance: 39.3 mL/min (A) (by C-G formula based on SCr of 1.82 mg/dL (H)). Recent Labs  Lab 07/11/22 1044 07/12/22 0723  WBC 14.1* 12.6*     Liver Function Tests: Recent Labs  Lab 07/11/22 1044  AST 57*  ALT 46*  ALKPHOS 61  BILITOT 0.6  PROT 7.2  ALBUMIN 3.4*    Recent Labs  Lab 07/11/22 1044  LIPASE 48    No results for input(s): "AMMONIA" in the last 168 hours.  ABG    Component Value Date/Time   HCO3 16.6 (L) 08/01/2021 0832   ACIDBASEDEF 7.6 (H) 08/01/2021 0832   O2SAT 85.6 08/01/2021 0832     Coagulation Profile: Recent Labs  Lab 07/11/22 1044 07/12/22 0502  INR 1.1 1.1     Cardiac Enzymes: No results for input(s): "CKTOTAL", "CKMB", "CKMBINDEX", "TROPONINI" in the last 168 hours.  HbA1C: No results found for: "HGBA1C"  CBG: Recent Labs  Lab 07/11/22 1405  GLUCAP 122*    Review of Systems:   Unable to assess due to AMS   Past Medical History:  He,  has a past medical history of CKD (chronic kidney disease), stage III ([place]), COVID-19 virus infection w/ Sepsis (11/2020), Dilated cardiomyopathy ([place]), ETOH abuse, Gastritis, HFrEF (heart failure with reduced ejection fraction) ([place]), Hiatal hernia, Iron deficiency anemia, and PUD (peptic ulcer disease).   Surgical History:   Past Surgical History:  Procedure Laterality Date   ELBOW BURSA SURGERY     ESOPHAGOGASTRODUODENOSCOPY (EGD) WITH PROPOFOL N/A 11/29/2020   Procedure: ESOPHAGOGASTRODUODENOSCOPY (EGD) WITH PROPOFOL;  Surgeon: Toledo, Benay Pike, MD;  Location: [facility] ENDOSCOPY;  Service: Gastroenterology;  Laterality: N/A;   SHOULDER SURGERY Left      Social History:   reports that he has never smoked. He has never used smokeless tobacco. He reports current alcohol use. He reports that he does not currently use drugs.   Family History:  His family history is not on file.   Allergies No Known Allergies    Home Medications  Prior to Admission medications   Medication Sig Start Date End Date Taking? Authorizing Provider  acetaminophen (TYLENOL) 650 MG CR tablet Take 650 mg by mouth every 8 (eight) hours as needed for pain.   Yes [provider]  Aspirin-Acetaminophen-Caffeine (GOODY HEADACHE PO) Take 1 Package by mouth daily as needed.   Yes [provider]  bismuth subsalicylate (PEPTO BISMOL) 262 MG/15ML suspension Take 30 mLs by mouth every 6 (six) hours as needed.   Yes [provider]  furosemide (LASIX) 40 MG tablet Take 1 tablet (40 mg total) by mouth daily. Patient not taking: Reported on 03/20/2022 08/06/21   Sharen Hones, MD  iron polysaccharides (NIFEREX) 150 MG capsule Take 1 capsule (150  mg total) by mouth daily. Patient not taking: Reported on 07/11/2022 03/28/22   Patrecia Pour, MD  lisinopril (ZESTRIL) 2.5 MG tablet Take 1 tablet (2.5 mg total) by mouth daily. Patient not taking: Reported on 03/20/2022 08/06/21   Sharen Hones, MD  metoprolol succinate (TOPROL-XL) 25 MG 24 hr tablet Take 0.5 tablets (12.5 mg total) by mouth daily. Patient not taking: Reported on 03/20/2022 08/06/21   Sharen Hones, MD  pantoprazole (PROTONIX) 40 MG tablet Take 1 tablet (40 mg total) by mouth 2 (two) times daily. Patient not taking: Reported on 07/11/2022 03/28/22   Patrecia Pour, MD  simethicone (MYLICON) 80 MG chewable tablet Chew 1 tablet (80 mg total) by mouth every 6 (six) hours as needed for flatulence. Patient not taking: Reported on 03/20/2022 08/06/21   Sharen Hones, MD     Critical care time: 59 minutes     Darel Hong, AGACNP-BC [facility] Prefer epic messenger for cross cover needs If after hours, please call E-link

## 2022-07-12 NOTE — Interval H&P Note (Signed)
History and Physical Interval Note: Preprocedure H&P from 07/12/22  was reviewed and there was no interval change after seeing and examining the patient.  Written consent was obtained from the patient after discussion of risks, benefits, and alternatives. Patient has consented to proceed with Esophagogastroduodenoscopy with possible intervention   07/12/2022 9:40 AM  John Escobar  has presented today for surgery, with the diagnosis of upper gi bleed.  The various methods of treatment have been discussed with the patient and family. After consideration of risks, benefits and other options for treatment, the patient has consented to  Procedure(s): ESOPHAGOGASTRODUODENOSCOPY (EGD) WITH PROPOFOL (N/A) as a surgical intervention.  The patient's history has been reviewed, patient examined, no change in status, stable for surgery.  I have reviewed the patient's chart and labs.  Questions were answered to the patient's satisfaction.     Annamaria Helling

## 2022-07-12 NOTE — Anesthesia Preprocedure Evaluation (Signed)
Anesthesia Evaluation  Patient identified by MRN, date of birth, ID band Patient awake    Reviewed: Allergy & Precautions, H&P , NPO status , Patient's Chart, lab work & pertinent test results  History of Anesthesia Complications Negative for: history of anesthetic complications  Airway Mallampati: III  TM Distance: >3 FB Neck ROM: full    Dental  (+) Dental Advidsory Given, Poor Dentition   Pulmonary neg pulmonary ROS,    Pulmonary exam normal breath sounds clear to auscultation       Cardiovascular +CHF  + dysrhythmias Atrial Fibrillation  Rhythm:Regular Rate:Normal     Neuro/Psych negative neurological ROS  negative psych ROS   GI/Hepatic Neg liver ROS, PUD,   Endo/Other  negative endocrine ROS  Renal/GU Renal disease  negative genitourinary   Musculoskeletal negative musculoskeletal ROS (+)   Abdominal   Peds negative pediatric ROS (+)  Hematology  (+) Blood dyscrasia, anemia ,   Anesthesia Other Findings Past Medical History: No date: CKD (chronic kidney disease), stage III ([place]) 11/2020: COVID-19 virus infection w/ Sepsis No date: Dilated cardiomyopathy ([place])     Comment:  a. 07/2021 Echo: EF 20-25%, GrII DD. Low-nl RV fxn. Nl               RVSP.  Mildly dil LA. Mod MR/TR. No date: ETOH abuse No date: Gastritis No date: GIB (gastrointestinal bleeding) No date: HFrEF (heart failure with reduced ejection fraction) ([place])     Comment:  a. 07/2021 Echo: EF 20-25%. No date: Hiatal hernia No date: Iron deficiency anemia No date: PUD (peptic ulcer disease)  Past Surgical History: No date: [facility] SURGERY 11/29/2020: ESOPHAGOGASTRODUODENOSCOPY (EGD) WITH PROPOFOL; N/A     Comment:  Procedure: ESOPHAGOGASTRODUODENOSCOPY (EGD) WITH               PROPOFOL;  Surgeon: Toledo, Benay Pike, MD;  Location:               [facility] ENDOSCOPY;  Service: Gastroenterology;  Laterality:               N/A; No date:  SHOULDER SURGERY; Left  BMI    Body Mass Index: 22.66 kg/m      Reproductive/Obstetrics negative OB ROS                             Anesthesia Physical  Anesthesia Plan  ASA: 4 and emergent  Anesthesia Plan: General   Post-op Pain Management: Minimal or no pain anticipated   Induction: Intravenous  PONV Risk Score and Plan: 3 and Propofol infusion, TIVA and Ondansetron  Airway Management Planned: Nasal Cannula  Additional Equipment: None  Intra-op Plan:   Post-operative Plan: Extubation in OR  Informed Consent: I have reviewed the patients History and Physical, chart, labs and discussed the procedure including the risks, benefits and alternatives for the proposed anesthesia with the patient or authorized representative who has indicated his/her understanding and acceptance.     Dental advisory given  Plan Discussed with: CRNA and Surgeon  Anesthesia Plan Comments: (Discussed risks of anesthesia with patient, including possibility of difficulty with spontaneous ventilation under anesthesia necessitating airway intervention, PONV, and rare risks such as cardiac or respiratory or neurological events, and allergic reactions. Discussed the role of CRNA in patient's perioperative care. Patient understands.)        Anesthesia Quick Evaluation

## 2022-07-12 NOTE — Op Note (Addendum)
[facility] Gastroenterology Patient Name: John Escobar Procedure Date: 07/12/2022 10:03 AM MRN: [id number] Account #: [id number] Date of Birth: 01-15-1956 Admit Type: Inpatient Age: 66 Room: [facility] ENDO ROOM 4 Gender: Male Note Status: Finalized Instrument Name: Upper Endoscope [id number] Procedure:             Upper GI endoscopy Indications:           Acute post hemorrhagic anemia Providers:             Annamaria Helling DO, DO Referring MD:          No Local Md, MD (Referring MD) Medicines:             Monitored Anesthesia Care Complications:         No immediate complications. Estimated blood loss: None. Procedure:             Pre-Anesthesia Assessment:                        - Prior to the procedure, a History and Physical was                         performed, and patient medications and allergies were                         reviewed. The patient is competent. The risks and                         benefits of the procedure and the sedation options and                         risks were discussed with the patient. All questions                         were answered and informed consent was obtained.                         Patient identification and proposed procedure were                         verified by the physician, the nurse, the                         anesthesiologist, the anesthetist and the technician                         in the endoscopy suite. Mental Status Examination:                         alert and oriented. Airway Examination: normal                         oropharyngeal airway and neck mobility. Respiratory                         Examination: clear to auscultation. CV Examination:                         regular rate and rhythm. Prophylactic Antibiotics: The  patient does not require prophylactic antibiotics.                         Prior Anticoagulants: The patient has taken no                         previous  anticoagulant or antiplatelet agents. ASA                         Grade Assessment: IV - A patient with severe systemic                         disease that is a constant threat to life. After                         reviewing the risks and benefits, the patient was                         deemed in satisfactory condition to undergo the                         procedure. The anesthesia plan was to use monitored                         anesthesia care (MAC). Immediately prior to                         administration of medications, the patient was                         re-assessed for adequacy to receive sedatives. The                         heart rate, respiratory rate, oxygen saturations,                         blood pressure, adequacy of pulmonary ventilation, and                         response to care were monitored throughout the                         procedure. The physical status of the patient was                         re-assessed after the procedure.                        After obtaining informed consent, the endoscope was                         passed under direct vision. Throughout the procedure,                         the patient's blood pressure, pulse, and oxygen                         saturations were monitored continuously. The Endoscope  was introduced through the mouth, and advanced to the                         second part of duodenum. The upper GI endoscopy was                         accomplished without difficulty. The patient tolerated                         the procedure well. Findings:      Two non-bleeding superficial duodenal ulcers with a clean ulcer base       (Forrest Class III) were found in the first portion of the duodenum (in       the sweep). The largest lesion was 4 mm in largest dimension. Estimated       blood loss: none.      Scattered mild inflammation characterized by erosions, erythema and       granularity  was found in the entire examined stomach. erosion noted in       the antrum. No active bleeding. Estimated blood loss: none.      Esophagogastric landmarks were identified: the gastroesophageal junction       was found at 40 cm from the incisors.      LA Grade C (one or more mucosal breaks continuous between tops of 2 or       more mucosal folds, less than 75% circumference) esophagitis with no       bleeding was found. Estimated blood loss: none.      Biopsies not taken in setting of blood loss anemia.      Pt will need repeat EGD if maintains treatment on ppi to evaluate for       Barretts, Estimated blood loss: none.      The exam of the esophagus was otherwise normal.      A small hiatal hernia was present. Estimated blood loss: none.      No signs of avms. Small irritation in the gastric mucosa towards the       cardia, but nto actively bleeding. Impression:            - Non-bleeding duodenal ulcers with a clean ulcer base                         (Forrest Class III).                        - Gastritis.                        - Esophagogastric landmarks identified.                        - LA Grade C esophagitis with no bleeding.                        - Small hiatal hernia.                        - No specimens collected. Recommendation:        - Patient has a contact number available for  emergencies. The signs and symptoms of potential                         delayed complications were discussed with the patient.                         Return to normal activities tomorrow. Written                         discharge instructions were provided to the patient.                        - Return patient to ICU for ongoing care.                        - Low residue diet.                        - Use Protonix (pantoprazole) 40 mg PO BID for 8 weeks.                        - Repeat upper endoscopy in 8 weeks to evaluate the                         response to  therapy.                        - Return to GI clinic at appointment to be scheduled.                        - No aspirin, ibuprofen, naproxen, or other                         non-steroidal anti-inflammatory drugs indefinitely.                        - The findings and recommendations were discussed with                         the patient.                        - The findings and recommendations were discussed with                         the referring physician.                        - Patient has declined colonoscopy for further workup                        - GI to sign off. Available as needed.                        - Can discontinue octreotide gtt and ceftriaxone as no                         signs of portal hypertension on endoscopy Procedure Code(s):     --- Professional ---  15379, Esophagogastroduodenoscopy, flexible,                         transoral; diagnostic, including collection of                         specimen(s) by brushing or washing, when performed                         (separate procedure) Diagnosis Code(s):     --- Professional ---                        K26.9, Duodenal ulcer, unspecified as acute or                         chronic, without hemorrhage or perforation                        K29.70, Gastritis, unspecified, without bleeding                        K20.90, Esophagitis, unspecified without bleeding                        D62, Acute posthemorrhagic anemia CPT copyright 2019 [employer]. All rights reserved. The codes documented in this report are preliminary and upon coder review may  be revised to meet current compliance requirements. Attending Participation:      I personally performed the entire procedure. Volney American, DO Annamaria Helling DO, DO 07/12/2022 10:50:11 AM This report has been signed electronically. Number of Addenda: 0 Note Initiated On: 07/12/2022 10:03 AM Estimated Blood Loss:   Estimated blood loss: none.      [facility]

## 2022-07-12 NOTE — Transfer of Care (Signed)
Immediate Anesthesia Transfer of Care Note  Patient: John Escobar  Procedure(s) Performed: ESOPHAGOGASTRODUODENOSCOPY (EGD) WITH PROPOFOL  Patient Location: PACU and Endoscopy Unit  Anesthesia Type:MAC  Level of Consciousness: drowsy  Airway & Oxygen Therapy: Patient Spontanous Breathing  Post-op Assessment: Report given to RN  Post vital signs: stable  Last Vitals:  Vitals Value Taken Time  BP 119/82   Temp    Pulse 101   Resp 28   SpO2 99     Last Pain:  Vitals:   07/12/22 0957  TempSrc: Temporal  PainSc: 0-No pain         Complications: No notable events documented.

## 2022-07-12 NOTE — Progress Notes (Signed)
Patient oxygen saturations have dropped into the low to high 80s while he has slept today.  Attempted to place on nasal canula  patient refused stated it hurt his nose and he was not going to wear it.  Educated on the importance of the oxygen and still refuses, has offered multiple times this shift.  Provider made aware.

## 2022-07-12 NOTE — H&P (View-Only) (Signed)
Inpatient Follow-up/Progress Note   Patient ID: John Escobar is a 66 y.o. male.  Overnight Events / Subjective Findings Pt's hgb improved to 8.4 o/n after 3 u prbc, but downtrended to 7.2 and he is receiving at 4th unit at time of eval. Still with some tachycardia. Pt is tired, but able to answer questions appropriately and states he only wants something to drink and to be able to sleep.  He continues to decline colonoscopy, even if upper endoscopy is negative. Plan for egd today. Npo since midnight.  Review of Systems  Constitutional:  Positive for fatigue. Negative for activity change, appetite change, chills, diaphoresis, fever and unexpected weight change.  HENT:  Negative for trouble swallowing and voice change.   Respiratory:  Positive for shortness of breath. Negative for wheezing.   Cardiovascular:  Negative for chest pain, palpitations and leg swelling.  Gastrointestinal:  Positive for blood in stool (melena) and nausea. Negative for abdominal distention, abdominal pain, anal bleeding, constipation, diarrhea and vomiting.  Musculoskeletal:  Positive for arthralgias. Negative for myalgias.  Skin:  Positive for pallor. Negative for color change.  Neurological:  Positive for dizziness, weakness and light-headedness. Negative for syncope.  Psychiatric/Behavioral:  Negative for confusion. The patient is not nervous/anxious.   All other systems reviewed and are negative.    Medications  Current Facility-Administered Medications:    0.9 %  sodium chloride infusion, 10 mL/hr, Intravenous, Once, Merlyn Lot, MD, Stopped at 07/12/22 0518   0.9 %  sodium chloride infusion, , Intravenous, Continuous, Annamaria Helling, DO, Last Rate: 20 mL/hr at 07/12/22 0518, Restarted at 07/12/22 0518   cefTRIAXone (ROCEPHIN) 1 g in sodium chloride 0.9 % 100 mL IVPB, 1 g, Intravenous, Q24H, Annamaria Helling, DO   Chlorhexidine Gluconate Cloth 2 % PADS 6 each, 6 each, Topical, Daily,  Chawla, Harsh, MD   docusate sodium (COLACE) capsule 100 mg, 100 mg, Oral, BID PRN, Chawla, Harsh, MD   LORazepam (ATIVAN) tablet 1-4 mg, 1-4 mg, Oral, Q1H PRN **OR** LORazepam (ATIVAN) injection 1-4 mg, 1-4 mg, Intravenous, Q1H PRN, Chawla, Harsh, MD, 2 mg at 07/11/22 2249   [COMPLETED] octreotide (SANDOSTATIN) 2 mcg/mL load via infusion 50 mcg, 50 mcg, Intravenous, Once, 50 mcg at 07/11/22 1308 **AND** octreotide (SANDOSTATIN) 500 mcg in sodium chloride 0.9 % 250 mL (2 mcg/mL) infusion, 50 mcg/hr, Intravenous, Continuous, Merlyn Lot, MD, Last Rate: 25 mL/hr at 07/12/22 0500, 50 mcg/hr at 07/12/22 0500   ondansetron (ZOFRAN) injection 4 mg, 4 mg, Intravenous, Q6H PRN, Chawla, Harsh, MD, 4 mg at 07/12/22 0518   Oral care mouth rinse, 15 mL, Mouth Rinse, PRN, Chawla, Harsh, MD   pantoprazole (PROTONIX) injection 40 mg, 40 mg, Intravenous, Q12H, Annamaria Helling, DO, 40 mg at 07/11/22 2038   polyethylene glycol (MIRALAX / GLYCOLAX) packet 17 g, 17 g, Oral, Daily PRN, Chawla, Harsh, MD   sodium chloride 0.9 % 1,000 mL with thiamine 086 mg, folic acid 1 mg, M.V.I. Adult 10 mL infusion, , Intravenous, Daily, Chawla, Harsh, MD, Stopped at 07/11/22 2255  sodium chloride Stopped (07/12/22 0518)   sodium chloride 20 mL/hr at 07/12/22 0518   cefTRIAXone (ROCEPHIN)  IV     octreotide (SANDOSTATIN) 500 mcg in sodium chloride 0.9 % 250 mL (2 mcg/mL) infusion 50 mcg/hr (07/12/22 0500)   sodium chloride 0.9 % 1,000 mL with thiamine 761 mg, folic acid 1 mg, M.V.I. Adult 10 mL infusion Stopped (07/11/22 2255)    docusate sodium, LORazepam **OR** LORazepam, ondansetron (ZOFRAN)  IV, mouth rinse, polyethylene glycol   Objective    Vitals:   07/12/22 0200 07/12/22 0300 07/12/22 0400 07/12/22 0500  BP: 127/86 100/83 125/88 (!) 124/91  Pulse: (!) 107 (!) 108 (!) 108   Resp: (!) 25 (!) 25 (!) 25 (!) 28  Temp:      TempSrc:      SpO2: 92% 91% 92% 96%  Weight:    69.6 kg  Height:         Physical  Exam Vitals and nursing note reviewed.  Constitutional:      General: He is not in acute distress.    Appearance: He is ill-appearing. He is not toxic-appearing or diaphoretic.  HENT:     Head: Normocephalic and atraumatic.     Nose: Nose normal.     Mouth/Throat:     Mouth: Mucous membranes are dry.     Pharynx: Oropharynx is clear.  Eyes:     General: No scleral icterus.    Extraocular Movements: Extraocular movements intact.  Cardiovascular:     Rate and Rhythm: Regular rhythm. Tachycardia present.     Heart sounds: No murmur heard.    No friction rub. Gallop present.  Pulmonary:     Effort: Pulmonary effort is normal. No respiratory distress.     Breath sounds: Normal breath sounds. No wheezing, rhonchi or rales.  Abdominal:     General: Bowel sounds are normal. There is no distension.     Palpations: Abdomen is soft.     Tenderness: There is no abdominal tenderness. There is no guarding or rebound.  Musculoskeletal:     Cervical back: Neck supple.     Right lower leg: No edema.     Left lower leg: No edema.     Comments: Gouty changes to multiple joints  Skin:    General: Skin is warm and dry.     Coloration: Skin is pale. Skin is not jaundiced.  Neurological:     Mental Status: He is alert and oriented to person, place, and time. Mental status is at baseline.      Laboratory Data Recent Labs  Lab 07/11/22 1044 07/11/22 1950 07/12/22 0101  WBC 14.1*  --   --   HGB 4.5* 8.0* 8.4*  HCT 16.3* 25.4* 26.4*  PLT 383  --   --   NEUTOPHILPCT 81  --   --   LYMPHOPCT 10  --   --   MONOPCT 7  --   --   EOSPCT 1  --   --    Recent Labs  Lab 07/11/22 1044  NA 139  K 4.0  CL 108  CO2 19*  BUN 41*  CREATININE 1.82*  CALCIUM 8.9  PROT 7.2  BILITOT 0.6  ALKPHOS 61  ALT 46*  AST 57*  GLUCOSE 154*   Recent Labs  Lab 07/11/22 1044 07/12/22 0502  INR 1.1 1.1      Imaging Studies: CT Angio Abd/Pel w/ and/or w/o  Result Date: 07/11/2022 CLINICAL DATA:   Nausea, vomiting and anemia. History of gastric ulcer. EXAM: CT ANGIOGRAPHY ABDOMEN AND PELVIS WITH CONTRAST AND WITHOUT CONTRAST TECHNIQUE: Multidetector CT imaging of the abdomen and pelvis was performed using the standard protocol during bolus administration of intravenous contrast. Multiplanar reconstructed images and MIPs were obtained and reviewed to evaluate the vascular anatomy. RADIATION DOSE REDUCTION: This exam was performed according to the departmental dose-optimization program which includes automated exposure control, adjustment of the mA and/or kV according to patient  size and/or use of iterative reconstruction technique. CONTRAST:  55mL OMNIPAQUE IOHEXOL 350 MG/ML SOLN, 72mL OMNIPAQUE IOHEXOL 300 MG/ML SOLN COMPARISON:  None Available. FINDINGS: VASCULAR Aorta: Atherosclerosis of the abdominal aorta without evidence of aneurysm or dissection. Celiac: Normally patent.  Normally patent visualized branch vessels. SMA: Normally patent.  Replaced right hepatic artery. Renals: Minimal atherosclerosis of bilateral single renal arteries. No evidence of renal artery stenosis. IMA: Normally patent. Inflow: Normally patent bilateral iliac arteries without stenosis or aneurysm. Proximal Outflow: Normally patent bilateral common femoral arteries and femoral bifurcations. Veins: Venous phase imaging demonstrates normal patency of venous structures in the abdomen and pelvis. No evidence of thrombus. No venous anatomic variants. Review of the MIP images confirms the above findings. NON-VASCULAR Lower chest: No acute abnormality. Hepatobiliary: No focal liver abnormality is seen. No gallstones, gallbladder wall thickening, or biliary dilatation. Pancreas: Unremarkable. No pancreatic ductal dilatation or surrounding inflammatory changes. Spleen: Normal in size without focal abnormality. Adrenals/Urinary Tract: Adrenal glands are unremarkable. Kidneys are normal, without renal calculi, focal lesion, or hydronephrosis.  Bladder is unremarkable. Stomach/Bowel: Bowel shows no evidence of obstruction, ileus, inflammation or lesion. No ulcers identified. No evidence active bleeding into the gastrointestinal tract on arterial or venous phases. Mild scattered diverticula of the colon without evidence of diverticulitis. The appendix is normal. No free intraperitoneal air. Lymphatic: No enlarged abdominal or pelvic lymph nodes. Reproductive: Mild prostatic enlargement. Other: No abdominal wall hernia or abnormality. No abdominopelvic ascites. Musculoskeletal: Degenerative disc disease at L4-5. IMPRESSION: 1. No acute findings in the abdomen or pelvis. No evidence of active bleeding into the gastrointestinal tract on arterial or venous phases. 2. No visualized gastrointestinal ulcer or lesion by CT. 3. Atherosclerosis of the abdominal aorta without evidence of aneurysm or dissection. 4. Replaced right hepatic artery. 5. Mild prostatic enlargement. 6. Degenerative disc disease at L4-5. Electronically Signed   By: Aletta Edouard M.D.   On: 07/11/2022 12:25   DG Chest Portable 1 View  Result Date: 07/11/2022 CLINICAL DATA:  66 year old male with vomiting. Sick for a week. Decreased p.o. EXAM: PORTABLE CHEST 1 VIEW COMPARISON:  Portable chest 03/20/2022 and earlier. FINDINGS: Portable AP semi upright view at 1050 hours. Similar low normal lung volumes. Normal cardiac size and mediastinal contours. Visualized tracheal air column is within normal limits. Allowing for portable technique the lungs are clear. Negative visible bowel gas. No pneumoperitoneum identified beneath the diaphragm. No acute osseous abnormality identified. IMPRESSION: No acute cardiopulmonary abnormality. Electronically Signed   By: Genevie Ann M.D.   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 Likely high risk for sedation.  Protonix 40 mg iv q12 h Octreotide bolus and gtt for 72 hours total Ceftriaxone 1 g iv q24 for 7 days total Monitor H&H.  Transfusion and resuscitation as per primary team Avoid frequent lab draws to prevent lab induced anemia Supportive care and antiemetics as per primary team Maintain two sites IV access Avoid nsaids Monitor for GIB. CIWA protocol; Counseled on etoh cessation Pt continues to decline colonoscopy. Informed that if upper endoscopy is negative, we may not be able to know if he has another source of bleeding or a malignancy, however, the pt still declines  Recommend iv iron infusion while the patient is in the hospital. PO iron as outpatient if pt willing to take.  Further recommendations pending endoscopy. Please see op report for further details   Esophagogastroduodenoscopy with possible biopsy, control of bleeding, polypectomy, and interventions as necessary has been discussed with the patient/patient representative. Informed consent was obtained from the patient/patient representative after  explaining the indication, nature, and risks of the procedure including but not limited to death, bleeding, perforation, missed neoplasm/lesions, cardiorespiratory compromise, and reaction to medications. Opportunity for questions was given and appropriate answers were provided. Patient/patient representative has verbalized understanding is amenable to undergoing the procedure.  I personally performed the service.  Management of other medical comorbidities as per primary team  Thank you for allowing [place] to participate in this patient's care. Please don't hesitate to call if any questions or concerns arise.   Annamaria Helling, DO [facility]  Portions of the record may have been created with voice recognition software. Occasional wrong-word or 'sound-a-like' substitutions may have occurred due to the inherent limitations of voice recognition software.  Read the chart carefully and recognize, using context, where substitutions may have occurred.

## 2022-07-12 NOTE — Anesthesia Postprocedure Evaluation (Signed)
Anesthesia Post Note  Patient: John Escobar  Procedure(s) Performed: ESOPHAGOGASTRODUODENOSCOPY (EGD) WITH PROPOFOL  Patient location during evaluation: ICU Anesthesia Type: MAC Level of consciousness: awake and alert Pain management: pain level controlled Vital Signs Assessment: post-procedure vital signs reviewed and stable Respiratory status: spontaneous breathing, nonlabored ventilation, respiratory function stable and patient connected to nasal cannula oxygen Cardiovascular status: blood pressure returned to baseline and stable Postop Assessment: no apparent nausea or vomiting Anesthetic complications: no   No notable events documented.   Last Vitals:  Vitals:   07/12/22 1148 07/12/22 1200  BP: 120/83 (!) 116/91  Pulse: (!) 101 (!) 111  Resp: (!) 27 (!) 30  Temp: 36.7 C 36.7 C  SpO2: 91% 100%    Last Pain:  Vitals:   07/12/22 1200  TempSrc: Axillary  PainSc: 0-No pain                 Dimas Millin

## 2022-07-13 DIAGNOSIS — K922 Gastrointestinal hemorrhage, unspecified: Secondary | ICD-10-CM

## 2022-07-13 LAB — BASIC METABOLIC PANEL
Anion gap: 10 (ref 5–15)
BUN: 22 mg/dL (ref 8–23)
CO2: 21 mmol/L — ABNORMAL LOW (ref 22–32)
Calcium: 8.1 mg/dL — ABNORMAL LOW (ref 8.9–10.3)
Chloride: 112 mmol/L — ABNORMAL HIGH (ref 98–111)
Creatinine, Ser: 1.7 mg/dL — ABNORMAL HIGH (ref 0.61–1.24)
GFR, Estimated: 44 mL/min — ABNORMAL LOW (ref >60)
Glucose, Bld: 115 mg/dL — ABNORMAL HIGH (ref 70–99)
Potassium: 3.7 mmol/L (ref 3.5–5.1)
Sodium: 143 mmol/L (ref 135–145)

## 2022-07-13 LAB — CBC WITH DIFFERENTIAL/PLATELET
Abs Immature Granulocytes: 0.12 10*3/uL — ABNORMAL HIGH (ref 0.00–0.07)
Basophils Absolute: 0 10*3/uL (ref 0.0–0.1)
Basophils Relative: 0 %
Eosinophils Absolute: 0 10*3/uL (ref 0.0–0.5)
Eosinophils Relative: 0 %
HCT: 23.8 % — ABNORMAL LOW (ref 39.0–52.0)
Hemoglobin: 7.3 g/dL — ABNORMAL LOW (ref 13.0–17.0)
Immature Granulocytes: 1 %
Lymphocytes Relative: 4 %
Lymphs Abs: 0.5 10*3/uL — ABNORMAL LOW (ref 0.7–4.0)
MCH: 27.1 pg (ref 26.0–34.0)
MCHC: 30.7 g/dL (ref 30.0–36.0)
MCV: 88.5 fL (ref 80.0–100.0)
Monocytes Absolute: 0.9 10*3/uL (ref 0.1–1.0)
Monocytes Relative: 7 %
Neutro Abs: 11.2 10*3/uL — ABNORMAL HIGH (ref 1.7–7.7)
Neutrophils Relative %: 88 %
Platelets: 204 10*3/uL (ref 150–400)
RBC: 2.69 MIL/uL — ABNORMAL LOW (ref 4.22–5.81)
RDW: 18.1 % — ABNORMAL HIGH (ref 11.5–15.5)
WBC: 12.8 10*3/uL — ABNORMAL HIGH (ref 4.0–10.5)
nRBC: 0.3 % — ABNORMAL HIGH (ref 0.0–0.2)

## 2022-07-13 LAB — PREPARE FRESH FROZEN PLASMA: Unit division: 0

## 2022-07-13 LAB — APTT: aPTT: 38 seconds — ABNORMAL HIGH (ref 24–36)

## 2022-07-13 LAB — PROTIME-INR
INR: 1.2 (ref 0.8–1.2)
Prothrombin Time: 15.2 seconds (ref 11.4–15.2)

## 2022-07-13 LAB — HEMOGLOBIN AND HEMATOCRIT, BLOOD
HCT: 23.8 % — ABNORMAL LOW (ref 39.0–52.0)
Hemoglobin: 7.4 g/dL — ABNORMAL LOW (ref 13.0–17.0)

## 2022-07-13 LAB — MAGNESIUM: Magnesium: 2 mg/dL (ref 1.7–2.4)

## 2022-07-13 LAB — BPAM FFP
Blood Product Expiration Date: 202310042359
ISSUE DATE / TIME: 202309301142
Unit Type and Rh: 8400

## 2022-07-13 LAB — PHOSPHORUS: Phosphorus: 3.3 mg/dL (ref 2.5–4.6)

## 2022-07-13 MED ORDER — PANTOPRAZOLE SODIUM 40 MG PO TBEC: 40.0000 mg | DELAYED_RELEASE_TABLET | Freq: Two times a day (BID) | ORAL | 2 refills | Status: DC

## 2022-07-13 MED ORDER — SODIUM CHLORIDE 0.9 % IV SOLN: INTRAVENOUS | Status: DC

## 2022-07-13 MED ORDER — FOLIC ACID 1 MG PO TABS: 1.0000 mg | ORAL_TABLET | Freq: Every day | ORAL | Status: AC

## 2022-07-13 MED ORDER — PANTOPRAZOLE SODIUM 40 MG PO TBEC
40.0000 mg | DELAYED_RELEASE_TABLET | Freq: Two times a day (BID) | ORAL | Status: DC
  Administered 2022-07-13: 40 mg via ORAL
  Filled 2022-07-13: qty 1

## 2022-07-13 MED ORDER — SODIUM CHLORIDE 0.9 % IV BOLUS
500.0000 mL | Freq: Once | INTRAVENOUS | Status: AC
  Administered 2022-07-13: 500 mL via INTRAVENOUS

## 2022-07-13 MED ORDER — VITAMIN B-1 100 MG PO TABS: 100.0000 mg | ORAL_TABLET | Freq: Every day | ORAL | Status: AC

## 2022-07-13 MED ORDER — THIAMINE MONONITRATE 100 MG PO TABS
100.0000 mg | ORAL_TABLET | Freq: Every day | ORAL | Status: DC
  Administered 2022-07-13: 100 mg via ORAL
  Filled 2022-07-13: qty 1

## 2022-07-13 NOTE — Discharge Summary (Signed)
Physician Discharge Summary   John Escobar  male DOB: 08-16-1956  DVV:[id number]  PCP: Pcp, No  Admit date: 07/11/2022 Discharge date: 07/13/2022  Admitted From: home Disposition:  home CODE STATUS: Full code  Discharge Instructions     Discharge instructions   Complete by: As directed    You have ulcers in your small intestine and inflammation in your stomach and esophagus.  GI doctor recommends taking protonix twice daily for 2 months and follow up with GI as outpatient.  Don't take aspirin, ibuprofen, naproxen, or other non-steroidal anti-inflammatory drugs indefinitely.  Also don't drink alcohol.  You are anemic and iron deficient, so please continue to take over-the-counter iron supplement. [facility] Course:  For full details, please see H&P, progress notes, consult notes and ancillary notes.  Briefly,  John Escobar is a 66 year old male with a past medical history significant for chronic alcohol abuse, alcoholic hepatitis, dilated cardiomyopathy, chronic kidney disease stage IIIa, previous GI bleeds due to peptic ulcer disease and gastric AVM, gout, chronic NSAID use, anemia of chronic disease, hepatic steatosis who presents to [facility] ED on 07/11/2022 due to coffee-ground emesis.  While in the ED patient became hypotensive with systolic blood pressures in the 70s.  PCCM asked to admit.  Pt transferred to hospitalist service on 10/1.  Hemorrhagic shock in setting of acute GI bleed ~ RESOLVED   Symptomatic anemia in the setting of acute Upper GI bleed Iron-def --Hgb 4.5 on presentation.  Received total of 4u pRBC and 1u FFP.  Hgb 7.4 prior to discharge. CT angiogram of abdomen pelvis is negative EGD 07/12/22 with nonbleeding ulcers, nonbleeding esophagitis/gastritis. --Discontinued Octreotide and Rocephin as no signs of portal HTN on endoscopy - Use Protonix (pantoprazole) 40 mg PO BID for 8 weeks. - Repeat upper endoscopy in 8 weeks to evaluate the response to  therapy. - Return to GI clinic at appointment to be scheduled. - No aspirin, ibuprofen, naproxen, or other non-steroidal anti-inflammatory drugs indefinitely. - Patient has declined colonoscopy for further workup   Leukocytosis, suspect reactive    Concern for Developing DT's ETOH abuse -Thiamine, folic acid, multivitamin   CKD Stage IIIa stable   Hyperglycemia   Unless noted above, medications under "STOP" list are ones pt was not taking PTA.  Discharge Diagnoses:  Principal Problem:   Upper GI bleed   30 Day Unplanned Readmission Risk Score    Flowsheet Row ED to Hosp-Admission (Current) from 07/11/2022 in [place] ICU/CCU  30 Day Unplanned Readmission Risk Score (%) 21.13 Filed at 07/13/2022 1200       This score is the patient's risk of an unplanned readmission within 30 days of being discharged (0 -100%). The score is based on dignosis, age, lab data, medications, orders, and past utilization.   Low:  0-14.9   Medium: 15-21.9   High: 22-29.9   Extreme: 30 and above         Discharge Instructions:  Allergies as of 07/13/2022   No Known Allergies      Medication List     STOP taking these medications    furosemide 40 MG tablet Commonly known as: LASIX   GOODY HEADACHE PO   lisinopril 2.5 MG tablet Commonly known as: ZESTRIL   metoprolol succinate 25 MG 24 hr tablet Commonly known as: TOPROL-XL   simethicone 80 MG chewable tablet Commonly known as: MYLICON       TAKE these medications    acetaminophen  650 MG CR tablet Commonly known as: TYLENOL Take 650 mg by mouth every 8 (eight) hours as needed for pain.   bismuth subsalicylate [id number] suspension Commonly known as: PEPTO BISMOL Take 30 mLs by mouth every 6 (six) hours as needed.   folic acid 1 MG tablet Commonly known as: FOLVITE Take 1 tablet (1 mg total) by mouth daily. Start taking on: July 14, 2022   iron polysaccharides 150 MG capsule Commonly  known as: NIFEREX Take 1 capsule (150 mg total) by mouth daily.   pantoprazole 40 MG tablet Commonly known as: PROTONIX Take 1 tablet (40 mg total) by mouth 2 (two) times daily.   thiamine 100 MG tablet Commonly known as: Vitamin B-1 Take 1 tablet (100 mg total) by mouth daily. Start taking on: July 14, 2022         Follow-up Information     Gollan, Kathlene November, MD Follow up in 1 week(s).   Specialty: Cardiology Contact information: [place] [id number] [phone]                 No Known Allergies   The results of significant diagnostics from this hospitalization (including imaging, microbiology, ancillary and laboratory) are listed below for reference.   Consultations:   Procedures/Studies: CT Angio Abd/Pel w/ and/or w/o  Result Date: 07/11/2022 CLINICAL DATA:  Nausea, vomiting and anemia. History of gastric ulcer. EXAM: CT ANGIOGRAPHY ABDOMEN AND PELVIS WITH CONTRAST AND WITHOUT CONTRAST TECHNIQUE: Multidetector CT imaging of the abdomen and pelvis was performed using the standard protocol during bolus administration of intravenous contrast. Multiplanar reconstructed images and MIPs were obtained and reviewed to evaluate the vascular anatomy. RADIATION DOSE REDUCTION: This exam was performed according to the departmental dose-optimization program which includes automated exposure control, adjustment of the mA and/or kV according to patient size and/or use of iterative reconstruction technique. CONTRAST:  73mL OMNIPAQUE IOHEXOL 350 MG/ML SOLN, 91mL OMNIPAQUE IOHEXOL 300 MG/ML SOLN COMPARISON:  None Available. FINDINGS: VASCULAR Aorta: Atherosclerosis of the abdominal aorta without evidence of aneurysm or dissection. Celiac: Normally patent.  Normally patent visualized branch vessels. SMA: Normally patent.  Replaced right hepatic artery. Renals: Minimal atherosclerosis of bilateral single renal arteries. No evidence of renal artery  stenosis. IMA: Normally patent. Inflow: Normally patent bilateral iliac arteries without stenosis or aneurysm. Proximal Outflow: Normally patent bilateral common femoral arteries and femoral bifurcations. Veins: Venous phase imaging demonstrates normal patency of venous structures in the abdomen and pelvis. No evidence of thrombus. No venous anatomic variants. Review of the MIP images confirms the above findings. NON-VASCULAR Lower chest: No acute abnormality. Hepatobiliary: No focal liver abnormality is seen. No gallstones, gallbladder wall thickening, or biliary dilatation. Pancreas: Unremarkable. No pancreatic ductal dilatation or surrounding inflammatory changes. Spleen: Normal in size without focal abnormality. Adrenals/Urinary Tract: Adrenal glands are unremarkable. Kidneys are normal, without renal calculi, focal lesion, or hydronephrosis. Bladder is unremarkable. Stomach/Bowel: Bowel shows no evidence of obstruction, ileus, inflammation or lesion. No ulcers identified. No evidence active bleeding into the gastrointestinal tract on arterial or venous phases. Mild scattered diverticula of the colon without evidence of diverticulitis. The appendix is normal. No free intraperitoneal air. Lymphatic: No enlarged abdominal or pelvic lymph nodes. Reproductive: Mild prostatic enlargement. Other: No abdominal wall hernia or abnormality. No abdominopelvic ascites. Musculoskeletal: Degenerative disc disease at L4-5. IMPRESSION: 1. No acute findings in the abdomen or pelvis. No evidence of active bleeding into the gastrointestinal tract on arterial or venous phases. 2.  No visualized gastrointestinal ulcer or lesion by CT. 3. Atherosclerosis of the abdominal aorta without evidence of aneurysm or dissection. 4. Replaced right hepatic artery. 5. Mild prostatic enlargement. 6. Degenerative disc disease at L4-5. Electronically Signed   By: Aletta Edouard M.D.   On: 07/11/2022 12:25   DG Chest Portable 1 View  Result  Date: 07/11/2022 CLINICAL DATA:  66 year old male with vomiting. Sick for a week. Decreased p.o. EXAM: PORTABLE CHEST 1 VIEW COMPARISON:  Portable chest 03/20/2022 and earlier. FINDINGS: Portable AP semi upright view at 1050 hours. Similar low normal lung volumes. Normal cardiac size and mediastinal contours. Visualized tracheal air column is within normal limits. Allowing for portable technique the lungs are clear. Negative visible bowel gas. No pneumoperitoneum identified beneath the diaphragm. No acute osseous abnormality identified. IMPRESSION: No acute cardiopulmonary abnormality. Electronically Signed   By: Genevie Ann M.D.   On: 07/11/2022 11:00      Labs: BNP (last 3 results) Recent Labs    08/01/21 0832  BNP [id number]*   Basic Metabolic Panel: Recent Labs  Lab 07/11/22 1044 07/12/22 0502 07/13/22 0416  NA 139 142 143  K 4.0 4.4 3.7  CL 108 117* 112*  CO2 19* 14* 21*  GLUCOSE 154* 114* 115*  BUN 41* 30* 22  CREATININE 1.82* 1.64* 1.70*  CALCIUM 8.9 8.0* 8.1*  MG  --  1.9 2.0  PHOS  --  4.8* 3.3   Liver Function Tests: Recent Labs  Lab 07/11/22 1044  AST 57*  ALT 46*  ALKPHOS 61  BILITOT 0.6  PROT 7.2  ALBUMIN 3.4*   Recent Labs  Lab 07/11/22 1044  LIPASE 48   No results for input(s): "AMMONIA" in the last 168 hours. CBC: Recent Labs  Lab 07/11/22 1044 07/11/22 1950 07/12/22 0723 07/12/22 1451 07/12/22 2307 07/13/22 0416 07/13/22 0715  WBC 14.1*  --  12.6*  --   --  12.8*  --   NEUTROABS 11.4*  --  11.0*  --   --  11.2*  --   HGB 4.5*   < > 7.2* 8.0* 8.1* 7.3* 7.4*  HCT 16.3*   < > 23.0* 25.5* 26.1* 23.8* 23.8*  MCV 92.6  --  88.5  --   --  88.5  --   PLT 383  --  239  --   --  204  --    < > = values in this interval not displayed.   Cardiac Enzymes: No results for input(s): "CKTOTAL", "CKMB", "CKMBINDEX", "TROPONINI" in the last 168 hours. BNP: Invalid input(s): "POCBNP" CBG: Recent Labs  Lab 07/11/22 1405  GLUCAP 122*   D-Dimer No results  for input(s): "DDIMER" in the last 72 hours. Hgb A1c No results for input(s): "HGBA1C" in the last 72 hours. Lipid Profile No results for input(s): "CHOL", "HDL", "LDLCALC", "TRIG", "CHOLHDL", "LDLDIRECT" in the last 72 hours. Thyroid function studies No results for input(s): "TSH", "T4TOTAL", "T3FREE", "THYROIDAB" in the last 72 hours.  Invalid input(s): "FREET3" Anemia work up Recent Labs    07/12/22 0502  VITAMINB12 575  FOLATE 33.0  FERRITIN 8*  TIBC 514*  IRON 16*   Urinalysis    Component Value Date/Time   COLORURINE YELLOW (A) 07/11/2022 1405   APPEARANCEUR CLEAR (A) 07/11/2022 1405   APPEARANCEUR Hazy 04/28/2012 1747   LABSPEC 1.035 (H) 07/11/2022 1405   LABSPEC 1.016 04/28/2012 1747   PHURINE 5.0 07/11/2022 1405   GLUCOSEU NEGATIVE 07/11/2022 1405   GLUCOSEU Negative 04/28/2012 1747  HGBUR NEGATIVE 07/11/2022 [place] 07/11/2022 1405   BILIRUBINUR Negative 04/28/2012 [place] 07/11/2022 1405   PROTEINUR NEGATIVE 07/11/2022 1405   NITRITE NEGATIVE 07/11/2022 1405   LEUKOCYTESUR NEGATIVE 07/11/2022 1405   LEUKOCYTESUR Trace 04/28/2012 1747   Sepsis Labs Recent Labs  Lab 07/11/22 1044 07/12/22 0723 07/13/22 0416  WBC 14.1* 12.6* 12.8*   Microbiology Recent Results (from the past 240 hour(s))  MRSA Next Gen by PCR, Nasal     Status: None   Collection Time: 07/11/22  2:55 PM   Specimen: Nasal Mucosa; Nasal Swab  Result Value Ref Range Status   MRSA by PCR Next Gen NOT DETECTED NOT DETECTED Final    Comment: (NOTE) The GeneXpert MRSA Assay (FDA approved for NASAL specimens only), is one component of a comprehensive MRSA colonization surveillance program. It is not intended to diagnose MRSA infection nor to guide or monitor treatment for MRSA infections. Test performance is not FDA approved in patients less than 65 years old. Performed at [facility], [address] [place], [place] [address]       Total time spend on discharging this patient, including the last patient exam, discussing the hospital stay, instructions for ongoing care as it relates to all pertinent caregivers, as well as preparing the medical discharge records, prescriptions, and/or referrals as applicable, is 45 minutes.    Enzo Bi, MD  [facility] 07/13/2022, 2:39 PM

## 2022-07-13 NOTE — Plan of Care (Signed)
Patient approved for discharge. Spoke with son, John Escobar who will be at home and available to help patient inside with rollator. Discharge instructions covered with patient. He states he cannot stop taking Goody powders because they are the only thing that helps his headache. Patient was advised that they contribute to his GI bleeding, among other activities like drinking alcohol and poor diet choices effect his health. Patient disagrees with these statements and states, "They don't know what the hell they are talking about." Patient remains irritable throughout discharge process but appears to be steadier on his feet than before. Patient was notified of the home health referral for him. He states that will not happen because his apartment complex contract states that is grounds for immediate eviction.   Patient was given discharge instructions, cab voucher, and wheeled downstairs to meet the cab to go home. Son, John Escobar notified of his father's departure.  Problem: Education: Goal: Knowledge of General Education information will improve Description: Including pain rating scale, medication(s)/side effects and non-pharmacologic comfort measures Outcome: Adequate for Discharge   Problem: Health Behavior/Discharge Planning: Goal: Ability to manage health-related needs will improve Outcome: Adequate for Discharge   Problem: Clinical Measurements: Goal: Ability to maintain clinical measurements within normal limits will improve Outcome: Adequate for Discharge Goal: Will remain free from infection Outcome: Adequate for Discharge Goal: Diagnostic test results will improve Outcome: Adequate for Discharge Goal: Respiratory complications will improve Outcome: Adequate for Discharge Goal: Cardiovascular complication will be avoided Outcome: Adequate for Discharge   Problem: Activity: Goal: Risk for activity intolerance will decrease Outcome: Adequate for Discharge   Problem: Nutrition: Goal:  Adequate nutrition will be maintained Outcome: Adequate for Discharge   Problem: Coping: Goal: Level of anxiety will decrease Outcome: Adequate for Discharge   Problem: Elimination: Goal: Will not experience complications related to bowel motility Outcome: Adequate for Discharge Goal: Will not experience complications related to urinary retention Outcome: Adequate for Discharge   Problem: Pain Managment: Goal: General experience of comfort will improve Outcome: Adequate for Discharge   Problem: Safety: Goal: Ability to remain free from injury will improve Outcome: Adequate for Discharge   Problem: Skin Integrity: Goal: Risk for impaired skin integrity will decrease Outcome: Adequate for Discharge

## 2022-07-13 NOTE — Progress Notes (Signed)
Patient is refusing much of his care and/or trying to talk his way out of care. MD aware of refusals and general lack of cooperation.

## 2022-07-13 NOTE — Evaluation (Signed)
Physical Therapy Evaluation Patient Details Name: John Escobar MRN: [id number] DOB: Jun 01, 1956 Today's Date: 07/13/2022  History of Present Illness  Pt is a 60 M admitted on 07/11/22 for hemorrhagic shock in the setting of acute upper GI bleed. EGD 07/12/22 with non-bleeding duodenal ulcers, and non-bleeding esophagitis/gastritis. PMH: alcohol abuse, peptic ulcer disease, alcoholic hepatitis, dilated cardiomyopathy, CKD 3A, gastric AVM, gout, chronic NSAID use, anemia of chronic disease, hepatic steatosis  Clinical Impression  Pt seen for PT evaluation with pt reporting he lives with 3 children in an apartment on the 2nd level with a full flight to access. Pt reports prior to admission he was ambulatory with [facility] and during session, notes his current gait pattern has been his baseline for ~1 year. On this date, pt is mod I with bed mobility & STS. Pt ambulates into hallway with IV pole, but electing to hold with BUE & declining use of RW, with supervision. Pt reports he has a rollator at home & PT encouraged him to use it for increased support & balance. When discussing stair negotiation pt reports his son, Perlie Gold, can "carry him up the stairs like a sack of potatoes". Pt seems to be close to his baseline, albeit weak (pt also reports this). Anticipate pt can d/c home with assistance from family (will require it for stair negotiation) & HHPT f/u. Will continue to follow pt acutely to address balance, endurance, and gait & stairs with LRAD.    Recommendations for follow up therapy are one component of a multi-disciplinary discharge planning process, led by the attending physician.  Recommendations may be updated based on patient status, additional functional criteria and insurance authorization.  Follow Up Recommendations Home health PT      Assistance Recommended at Discharge Intermittent Supervision/Assistance  Patient can return home with the following  A little help with  bathing/dressing/bathroom;A little help with walking and/or transfers;Assistance with cooking/housework;Help with stairs or ramp for entrance;Assist for transportation    Equipment Recommendations None recommended by PT  Recommendations for Other Services       Functional Status Assessment Patient has had a recent decline in their functional status and demonstrates the ability to make significant improvements in function in a reasonable and predictable amount of time.     Precautions / Restrictions Precautions Precautions: Fall Restrictions Weight Bearing Restrictions: No      Mobility  Bed Mobility Overal bed mobility: Modified Independent             General bed mobility comments: supine<>sit with mod I with HOB slightly elevated    Transfers Overall transfer level: Modified independent Equipment used: None Transfers: Sit to/from Stand             General transfer comment: STS from EOB    Ambulation/Gait Ambulation/Gait assistance: Supervision Gait Distance (Feet): 50 Feet Assistive device: IV Pole Gait Pattern/deviations: Decreased step length - right, Decreased step length - left, Decreased dorsiflexion - right, Decreased dorsiflexion - left, Decreased stride length, Shuffle Gait velocity: decreased     General Gait Details: Pt with decreased dorsiflexion BLE during swing phase, absent heel strike, shuffling gait (pt reports this is baseline ~1 year), decreased step length BLE, decreased stride length, decreased gait speed. Pt holds to IV pole with LUE to simulate SPC but also elects to lightly hold to it with RUE, pt declines using RW (stating "I've seen people trip over them.")  Careers information officer  Modified Rankin (Stroke Patients Only)       Balance Overall balance assessment: Needs assistance Sitting-balance support: Feet supported Sitting balance-Leahy Scale: Good Sitting balance - Comments: supervision static  sitting   Standing balance support: Single extremity supported, During functional activity Standing balance-Leahy Scale: Fair                               Pertinent Vitals/Pain Pain Assessment Pain Assessment: No/denies pain    Home Living Family/patient expects to be discharged to:: Private residence Living Arrangements: Children Available Help at Discharge: Family Type of Home: Apartment Home Access: Stairs to enter Entrance Stairs-Rails: Right (per chart) Entrance Stairs-Number of Steps: flight   Home Layout: One level Home Equipment: Rollator (4 wheels);Cane - single point      Prior Function Prior Level of Function : Independent/Modified Independent             Mobility Comments: Pt reports he's ambulatory with SPC       Hand Dominance        Extremity/Trunk Assessment        Lower Extremity Assessment Lower Extremity Assessment: Generalized weakness (pt with enlarged joints in feet)       Communication   Communication: No difficulties  Cognition Arousal/Alertness: Awake/alert Behavior During Therapy: WFL for tasks assessed/performed (slightly irritable) Overall Cognitive Status: Within Functional Limits for tasks assessed                                          General Comments General comments (skin integrity, edema, etc.): BP sitting EOB after gait in LUE 105/82 mmHg MAP 90    Exercises     Assessment/Plan    PT Assessment Patient needs continued PT services  PT Problem List Decreased strength;Decreased coordination;Decreased range of motion;Decreased activity tolerance;Decreased balance;Decreased mobility;Decreased safety awareness;Decreased knowledge of use of DME       PT Treatment Interventions DME instruction;Therapeutic exercise;Balance training;Gait training;Stair training;Neuromuscular re-education;Functional mobility training;Therapeutic activities;Patient/family education;Manual  techniques;Modalities    PT Goals (Current goals can be found in the Care Plan section)  Acute Rehab PT Goals Patient Stated Goal: none stated PT Goal Formulation: With patient Time For Goal Achievement: 07/27/22 Potential to Achieve Goals: Good    Frequency Min 2X/week     Co-evaluation               AM-PAC PT "6 Clicks" Mobility  Outcome Measure Help needed turning from your back to your side while in a flat bed without using bedrails?: None Help needed moving from lying on your back to sitting on the side of a flat bed without using bedrails?: None Help needed moving to and from a bed to a chair (including a wheelchair)?: A Little Help needed standing up from a chair using your arms (e.g., wheelchair or bedside chair)?: None Help needed to walk in hospital room?: A Little Help needed climbing 3-5 steps with a railing? : A Little 6 Click Score: 21    End of Session   Activity Tolerance: Patient tolerated treatment well (limited by stomach not feeling well, feeling "weak") Patient left: in bed;with call bell/phone within reach Nurse Communication: Mobility status PT Visit Diagnosis: Unsteadiness on feet (R26.81);Muscle weakness (generalized) (M62.81);Other abnormalities of gait and mobility (R26.89)    Time: 1359-1415 PT Time Calculation (min) (ACUTE ONLY): 16 min  Charges:   PT Evaluation $PT Eval Low Complexity: 1 Low          Aleda Grana, PT, DPT 07/13/22, 2:26 PM   Sandi Mariscal 07/13/2022, 2:24 PM

## 2022-07-14 ENCOUNTER — Encounter: Payer: Self-pay | Admitting: Dietician

## 2022-07-14 ENCOUNTER — Encounter: Payer: Self-pay | Admitting: Gastroenterology

## 2022-07-14 NOTE — Progress Notes (Signed)
Nutrition Brief Note  Vitamin labs sent off in hospital as pt admitted with GIB and with h/o IDA. Labs returned as follows:     Pt does not have PCP on file to follow up with iron supplementation. Pt has iron polysaccharides 150mg  po daily on his home medication list. RD unsure if pt is compliant with medications at home.   Koleen Distance MS, RD, LDN Please refer to [facility] for RD and/or RD on-call/weekend/after hours pager

## 2022-07-15 LAB — BPAM RBC
Blood Product Expiration Date: 202310072359
Blood Product Expiration Date: 202310192359
Blood Product Expiration Date: 202310262359
Blood Product Expiration Date: 202310262359
Blood Product Expiration Date: 202310262359
Blood Product Expiration Date: 202310262359
ISSUE DATE / TIME: 202309291159
ISSUE DATE / TIME: 202309291159
ISSUE DATE / TIME: 202309291309
ISSUE DATE / TIME: 202309300855
Unit Type and Rh: 600
Unit Type and Rh: 6200
Unit Type and Rh: 6200
Unit Type and Rh: 6200
Unit Type and Rh: 6200
Unit Type and Rh: 6200

## 2022-07-15 LAB — TYPE AND SCREEN
ABO/RH(D): A POS
Antibody Screen: NEGATIVE
Unit division: 0
Unit division: 0
Unit division: 0
Unit division: 0
Unit division: 0
Unit division: 0

## 2022-07-15 LAB — PREPARE RBC (CROSSMATCH)

## 2022-12-10 IMAGING — US US ABDOMEN LIMITED
1 series · 14 of 25 positions shown · non-contrast
Comparison: None Available.

CLINICAL DATA: Cirrhosis.

EXAM:
ULTRASOUND ABDOMEN LIMITED RIGHT UPPER QUADRANT

[Series 1: us abdomen limited ruq (liver/gb) · 14 of 36 slices shown]
[im 1/36]
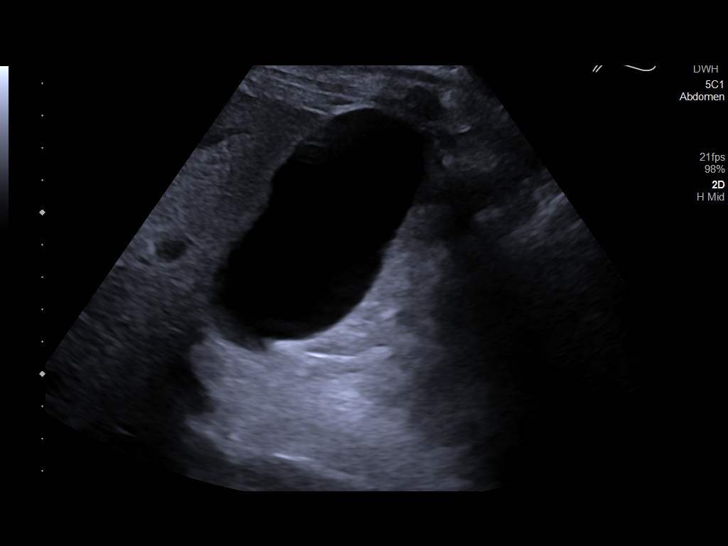
[im 3/36]
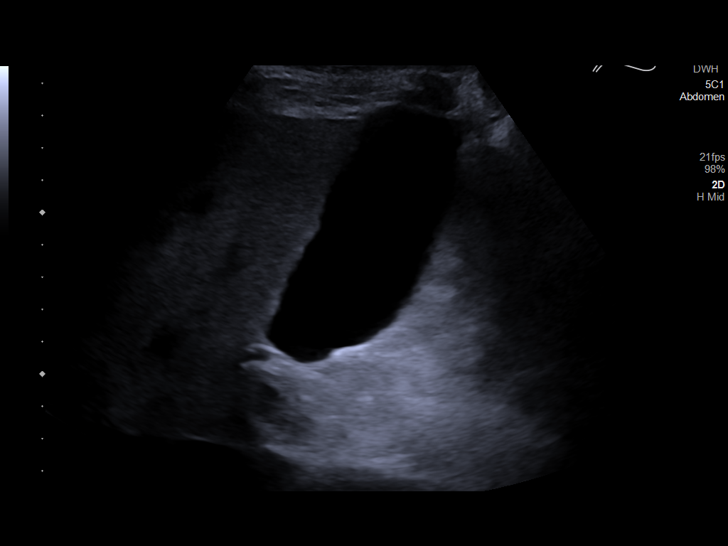
[im 6/36]
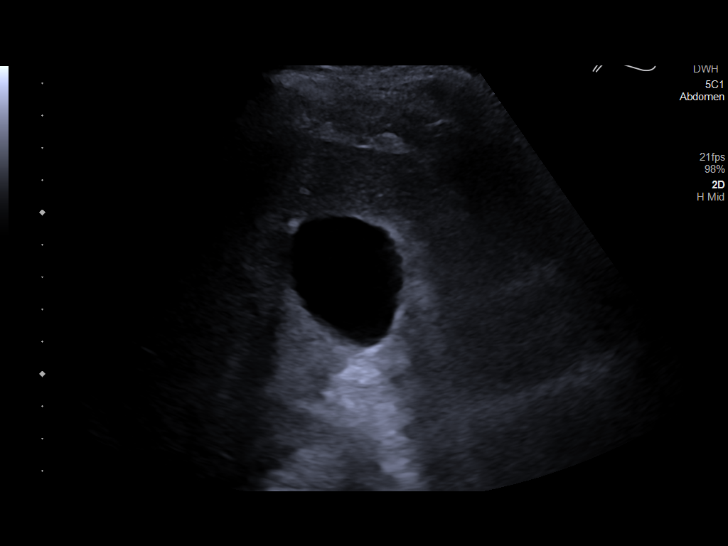
[im 9/36]
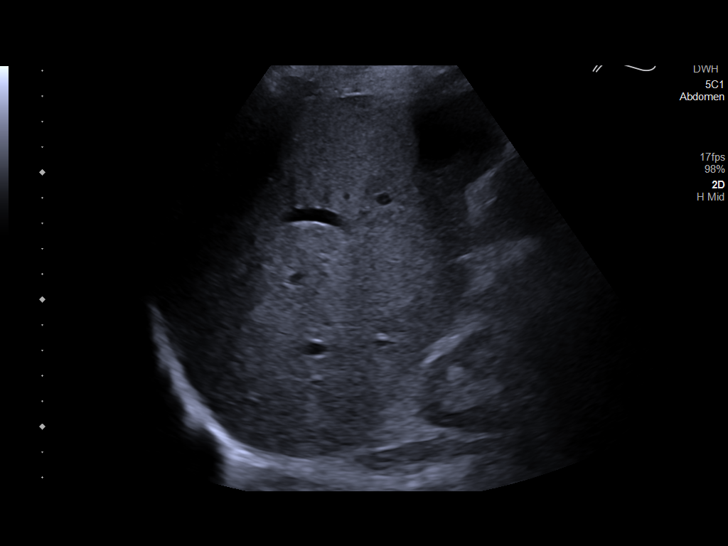
[im 12/36]
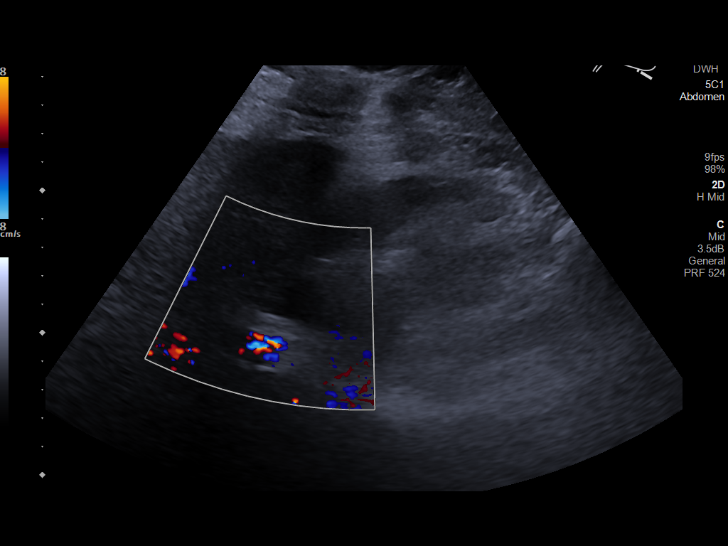
[im 14/36]
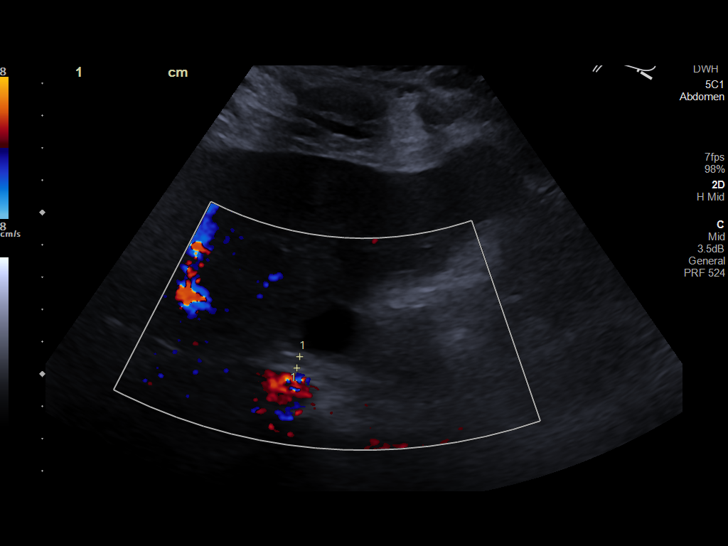
[im 17/36]
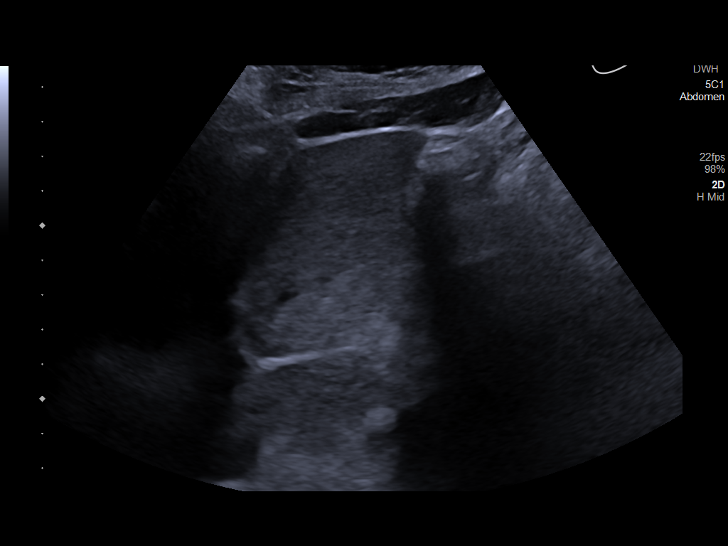
[im 19/36]
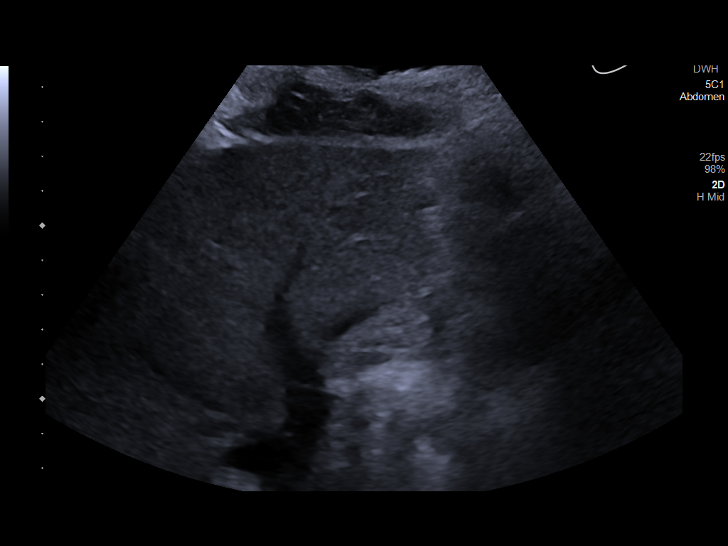
[im 22/36]
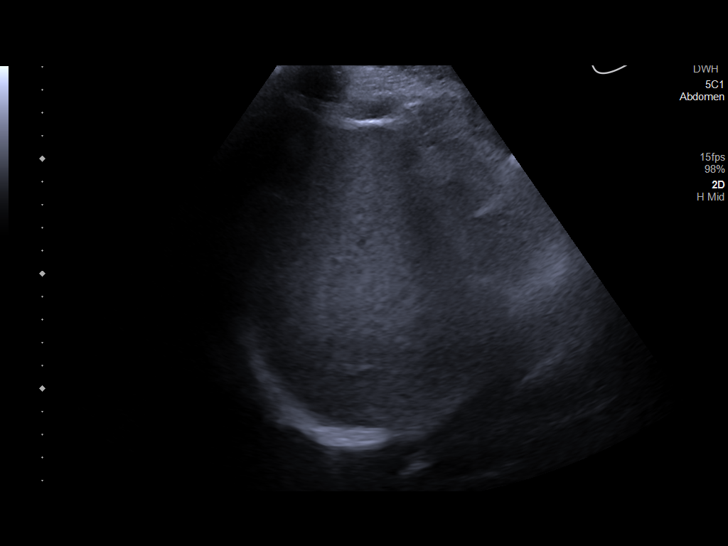
[im 24/36]
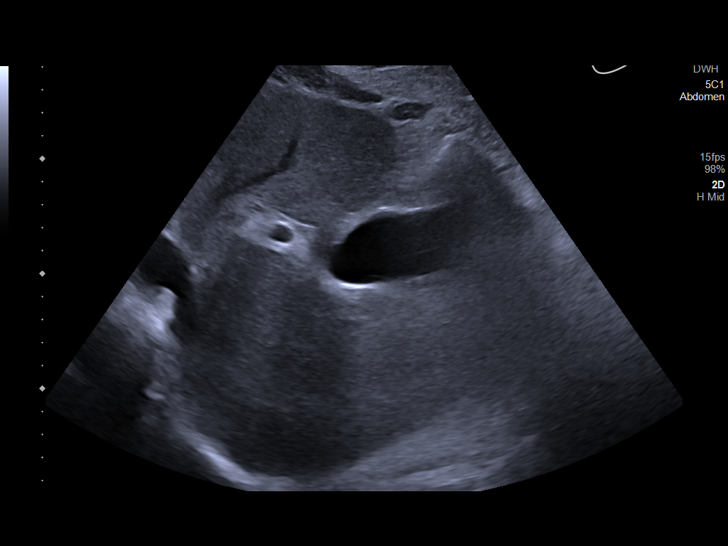
[im 27/36]
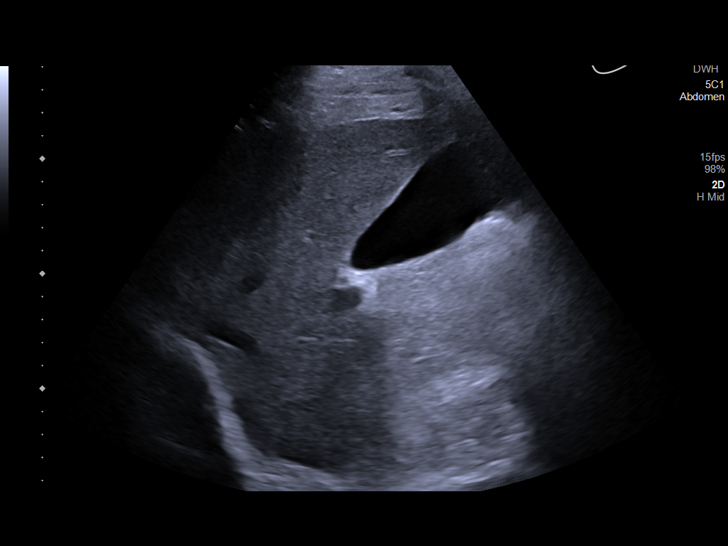
[im 30/36]
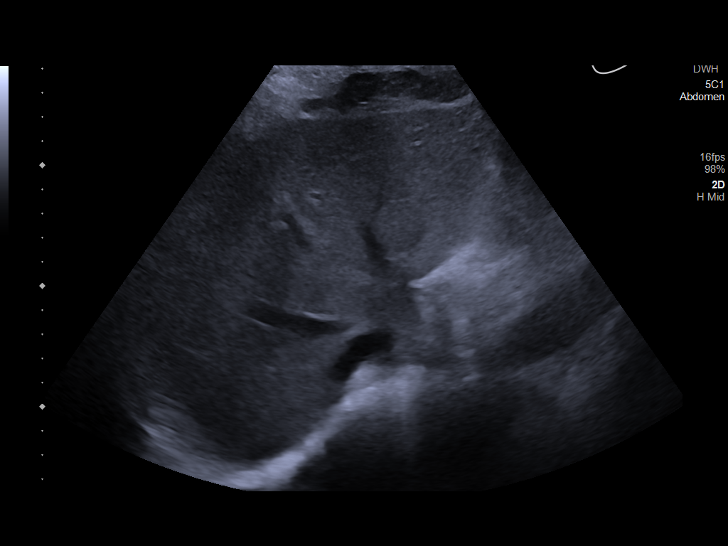
[im 33/36]
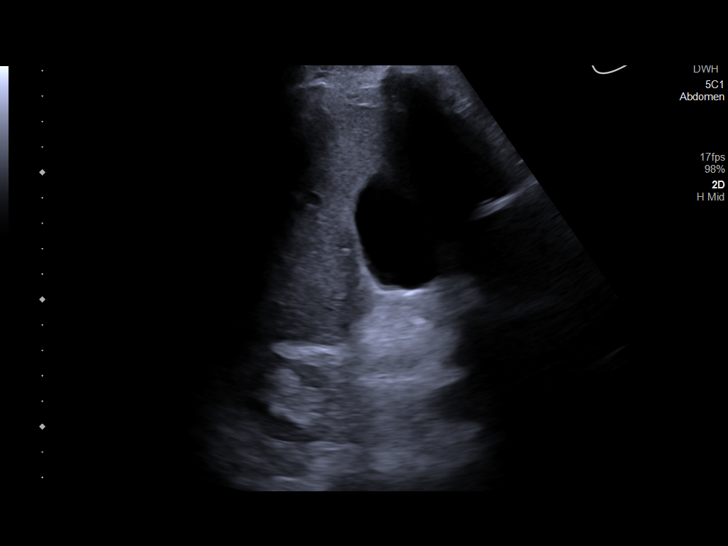
[im 36/36]
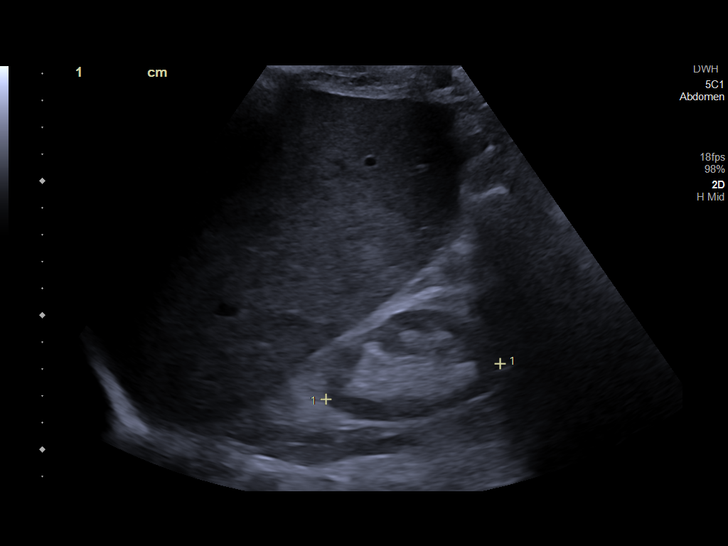

[14 of 25 positions shown; findings below may reference images not displayed]

FINDINGS: Gallbladder:

No gallstones or wall thickening visualized (2.6 mm). No sonographic
Murphy sign noted by sonographer.

Common bile duct:

Diameter: 3.6 mm

Liver:

No focal lesion identified. Diffusely increased echogenicity of the
liver parenchyma is noted. Portal vein is patent on color Doppler
imaging with normal direction of blood flow towards the liver.

Other: None.
IMPRESSION: Hepatic steatosis without focal liver lesions or evidence to suggest
the presence of hepatic cirrhosis.

## 2023-07-11 ENCOUNTER — Encounter: Payer: Self-pay | Admitting: Emergency Medicine

## 2023-07-11 ENCOUNTER — Emergency Department: Payer: Medicare HMO

## 2023-07-11 ENCOUNTER — Emergency Department
Admission: EM | Admit: 2023-07-11 | Discharge: 2023-07-12 | Disposition: A | Discharge status: Home / Self Care | Payer: Medicare HMO | Attending: Emergency Medicine | Admitting: Emergency Medicine

## 2023-07-11 ENCOUNTER — Other Ambulatory Visit: Payer: Self-pay

## 2023-07-11 DIAGNOSIS — W44F3XA Food entering into or through a natural orifice, initial encounter: Secondary | ICD-10-CM | POA: Diagnosis not present

## 2023-07-11 DIAGNOSIS — Z8616 Personal history of COVID-19: Secondary | ICD-10-CM | POA: Diagnosis not present

## 2023-07-11 DIAGNOSIS — I509 Heart failure, unspecified: Secondary | ICD-10-CM | POA: Insufficient documentation

## 2023-07-11 DIAGNOSIS — T18128A Food in esophagus causing other injury, initial encounter: Secondary | ICD-10-CM | POA: Diagnosis present

## 2023-07-11 DIAGNOSIS — N183 Chronic kidney disease, stage 3 unspecified: Secondary | ICD-10-CM | POA: Diagnosis not present

## 2023-07-11 MED ORDER — SODIUM CHLORIDE 0.9 % IV BOLUS (SEPSIS): 500.0000 mL | Freq: Once | INTRAVENOUS | Status: DC

## 2023-07-11 MED ORDER — NITROGLYCERIN 0.4 MG SL SUBL
0.4000 mg | SUBLINGUAL_TABLET | Freq: Once | SUBLINGUAL | Status: AC
  Administered 2023-07-12: 0.4 mg via SUBLINGUAL
  Filled 2023-07-11: qty 1

## 2023-07-11 MED ORDER — LIDOCAINE VISCOUS HCL 2 % MT SOLN
15.0000 mL | Freq: Once | OROMUCOSAL | Status: AC
  Administered 2023-07-12: 15 mL via OROMUCOSAL
  Filled 2023-07-11: qty 15

## 2023-07-11 MED ORDER — ALUM & MAG HYDROXIDE-SIMETH 200-200-20 MG/5ML PO SUSP
30.0000 mL | Freq: Once | ORAL | Status: AC
  Administered 2023-07-12: 30 mL via ORAL
  Filled 2023-07-11: qty 30

## 2023-07-11 MED ORDER — PANTOPRAZOLE SODIUM 40 MG IV SOLR
40.0000 mg | Freq: Once | INTRAVENOUS | Status: AC
  Administered 2023-07-12: 40 mg via INTRAVENOUS
  Filled 2023-07-11: qty 10

## 2023-07-11 MED ORDER — ONDANSETRON HCL 4 MG/2ML IJ SOLN
4.0000 mg | Freq: Once | INTRAMUSCULAR | Status: AC
  Administered 2023-07-12: 4 mg via INTRAVENOUS
  Filled 2023-07-11: qty 2

## 2023-07-11 NOTE — ED Notes (Signed)
This RN witnessed the patient be verbally aggressive with the XR staff. Pt was to be taken back for imaging with a staff member wearing a mask. Pt states to the staff member "I can't hear you because of you wearing that stupid mask". Pt was corrected on his behavior and encouraged not to speak to staff in such a manner. He was educated on the purpose of the mask and protecting oneself while working in this setting. Pt verbalized understanding. XR to attempt imaging at a later time.

## 2023-07-11 NOTE — ED Notes (Signed)
Pt has been having trouble eating past few days ate a hot dog a couple days ago and felt like it was lodged in his throat, yesterday attempted to eat a roll that also felt lodged, pt did not try eating anything today cause he was too scared and and is only able to take sips when drinking liquid otherwise feels like it is backing up. Denies being SOB, nauseous, any type of CP at this time.

## 2023-07-11 NOTE — ED Provider Notes (Signed)
[facility] Provider Note    Event Date/Time   First MD Initiated Contact with Patient 07/11/23 2349     (approximate)   History   Dysphagia   HPI  John Escobar is a 67 y.o. male with history of alcohol abuse, CHF, chronic kidney disease, peptic ulcer disease, gastritis who presents to the emergency department with complaints of feeling like food is stuck in his esophagus.  States 4 days ago he was eating a hot dog and he felt like it got stuck in his lower esophagus.  He states that he continued to eat and drink but then felt like something else got stuck few days later.  He states he is not able to keep down water but can keep down Gatorade and beer.  No difficulty breathing.   History provided by patient.    Past Medical History:  Diagnosis Date   CKD (chronic kidney disease), stage III (HCC)    COVID-19 virus infection w/ Sepsis 11/2020   Dilated cardiomyopathy (HCC)    a. 07/2021 Echo: EF 20-25%, GrII DD. Low-nl RV fxn. Nl RVSP.  Mildly dil LA. Mod MR/TR.   ETOH abuse    Gastritis    GIB (gastrointestinal bleeding)    HFrEF (heart failure with reduced ejection fraction) (HCC)    a. 07/2021 Echo: EF 20-25%.   Hiatal hernia    Iron deficiency anemia    PUD (peptic ulcer disease)     Past Surgical History:  Procedure Laterality Date   ELBOW BURSA SURGERY     ESOPHAGOGASTRODUODENOSCOPY (EGD) WITH PROPOFOL N/A 11/29/2020   Procedure: ESOPHAGOGASTRODUODENOSCOPY (EGD) WITH PROPOFOL;  Surgeon: Toledo, Boykin Nearing, MD;  Location: [facility] ENDOSCOPY;  Service: Gastroenterology;  Laterality: N/A;   ESOPHAGOGASTRODUODENOSCOPY (EGD) WITH PROPOFOL N/A 07/12/2022   Procedure: ESOPHAGOGASTRODUODENOSCOPY (EGD) WITH PROPOFOL;  Surgeon: Jaynie Collins, DO;  Location: [facility] ENDOSCOPY;  Service: Gastroenterology;  Laterality: N/A;   SHOULDER SURGERY Left     MEDICATIONS:  Prior to Admission medications   Medication Sig Start Date End Date Taking?  Authorizing Provider  acetaminophen (TYLENOL) 650 MG CR tablet Take 650 mg by mouth every 8 (eight) hours as needed for pain.    [provider]  bismuth subsalicylate (PEPTO BISMOL) 262 MG/15ML suspension Take 30 mLs by mouth every 6 (six) hours as needed.    [provider]  folic acid (FOLVITE) 1 MG tablet Take 1 tablet (1 mg total) by mouth daily. 07/14/22   Darlin Priestly, MD  iron polysaccharides (NIFEREX) 150 MG capsule Take 1 capsule (150 mg total) by mouth daily. Patient not taking: Reported on 07/11/2022 03/28/22   Tyrone Nine, MD  pantoprazole (PROTONIX) 40 MG tablet Take 1 tablet (40 mg total) by mouth 2 (two) times daily. 07/13/22 10/11/22  Darlin Priestly, MD  thiamine (VITAMIN B-1) 100 MG tablet Take 1 tablet (100 mg total) by mouth daily. 07/14/22   Darlin Priestly, MD    Physical Exam   Triage Vital Signs: ED Triage Vitals  Encounter Vitals Group     BP 07/11/23 2056 (!) 148/102     Systolic BP Percentile --      Diastolic BP Percentile --      Pulse Rate 07/11/23 2056 99     Resp 07/11/23 2056 18     Temp 07/11/23 2056 98.5 F (36.9 C)     Temp Source 07/11/23 2056 Oral     SpO2 07/11/23 2056 100 %     Weight 07/11/23  2055 156 lb 8.4 oz (71 kg)     Height 07/11/23 2055 5\' 9"  (1.753 m)     Head Circumference --      Peak Flow --      Pain Score 07/11/23 2054 0     Pain Loc --      Pain Education --      Exclude from Growth Chart --     Most recent vital signs: Vitals:   07/11/23 2056 07/11/23 2347  BP: (!) 148/102 (!) 169/94  Pulse: 99 100  Resp: 18 17  Temp: 98.5 F (36.9 C) 97.9 F (36.6 C)  SpO2: 100% 99%    CONSTITUTIONAL: Alert, responds appropriately to questions.  Chronically ill-appearing, appears older than stated age, in no distress, hard of hearing HEAD: Normocephalic, atraumatic EYES: Conjunctivae clear, pupils appear equal, sclera nonicteric ENT: normal nose; moist mucous membranes, no trismus or drooling, normal phonation NECK: Supple,  normal ROM CARD: RRR; S1 and S2 appreciated RESP: Normal chest excursion without splinting or tachypnea; breath sounds clear and equal bilaterally; no wheezes, no rhonchi, no rales, no hypoxia or respiratory distress, speaking full sentences ABD/GI: Non-distended; soft, non-tender, no rebound, no guarding, no peritoneal signs BACK: The back appears normal EXT: Normal ROM in all joints; no deformity noted, no edema SKIN: Normal color for age and race; warm; no rash on exposed skin NEURO: Moves all extremities equally, normal speech PSYCH: The patient's mood and manner are appropriate.   ED Results / Procedures / Treatments   LABS: (all labs ordered are listed, but only abnormal results are displayed) Labs Reviewed - No data to display   EKG:   RADIOLOGY: My personal review and interpretation of imaging: X-ray of the soft tissues of the neck is unremarkable.  I have personally reviewed all radiology reports.   DG Neck Soft Tissue  Result Date: 07/11/2023 CLINICAL DATA:  Food impaction. Patient ate a hot dog 4 days ago and feels sensation that it is still stuck. Nausea. Tolerate secretions. EXAM: NECK SOFT TISSUES - 1+ VIEW COMPARISON:  None Available. FINDINGS: There is no evidence of retropharyngeal soft tissue swelling or epiglottic enlargement. The cervical airway is unremarkable and no radio-opaque foreign body identified. Degenerative changes in the cervical spine. IMPRESSION: No radiopaque foreign bodies demonstrated. Soft tissues appear radiographically normal. Electronically Signed   By: Burman Nieves M.D.   On: 07/11/2023 22:21     PROCEDURES:  Critical Care performed: No   CRITICAL CARE Performed by: Baxter Hire Makinzi Prieur   Total critical care time: 0 minutes  Critical care time was exclusive of separately billable procedures and treating other patients.  Critical care was necessary to treat or prevent imminent or life-threatening deterioration.  Critical care was time  spent personally by me on the following activities: development of treatment plan with patient and/or surrogate as well as nursing, discussions with consultants, evaluation of patient's response to treatment, examination of patient, obtaining history from patient or surrogate, ordering and performing treatments and interventions, ordering and review of laboratory studies, ordering and review of radiographic studies, pulse oximetry and re-evaluation of patient's condition.   Procedures    IMPRESSION / MDM / ASSESSMENT AND PLAN / ED COURSE  I reviewed the triage vital signs and the nursing notes.    Patient here with possible esophageal food bolus.  Last endoscopy in September 2023 showed peptic ulcer disease, gastritis and a small hiatal hernia.     DIFFERENTIAL DIAGNOSIS (includes but not limited to):   Esophageal  food bolus, peptic ulcer, GERD, esophagitis, gastritis, hiatal hernia   Patient's presentation is most consistent with acute complicated illness / injury requiring diagnostic workup.   PLAN: Will give IV and oral medications to see if we can help give some symptomatic relief.  Patient seems to be tolerating his secretions here and I doubt that he has a complete obstruction of his esophagus but it could be partial in nature.  This also could be esophagitis, gastritis secondary to his history of alcohol abuse.  This also could be related to his hiatal hernia.  X-ray of the soft tissues of the neck was ordered from triage and reviewed and interpreted by myself and the radiologist and shows no acute abnormality.   MEDICATIONS GIVEN IN ED: Medications  pantoprazole (PROTONIX) injection 40 mg (has no administration in time range)  ondansetron (ZOFRAN) injection 4 mg (has no administration in time range)  sodium chloride 0.9 % bolus 500 mL (has no administration in time range)  nitroGLYCERIN (NITROSTAT) SL tablet 0.4 mg (has no administration in time range)  alum & mag  hydroxide-simeth (MAALOX/MYLANTA) 200-200-20 MG/5ML suspension 30 mL (has no administration in time range)  lidocaine (XYLOCAINE) 2 % viscous mouth solution 15 mL (has no administration in time range)     ED COURSE: After multiple rounds of medications patient is feeling better and able to tolerate p.o. here.  No vomiting.  Swallowing his secretions.  I feel he is safe to be discharged.  Will start him back on Protonix twice daily and recommend he avoid NSAIDs.  Recommended follow-up with gastroenterology if symptoms not improving.   At this time, I do not feel there is any life-threatening condition present. I reviewed all nursing notes, vitals, pertinent previous records.  All lab and urine results, EKGs, imaging ordered have been independently reviewed and interpreted by myself.  I reviewed all available radiology reports from any imaging ordered this visit.  Based on my assessment, I feel the patient is safe to be discharged home without further emergent workup and can continue workup as an outpatient as needed. Discussed all findings, treatment plan as well as usual and customary return precautions.  They verbalize understanding and are comfortable with this plan.  Outpatient follow-up has been provided as needed.  All questions have been answered.    CONSULTS:  none   OUTSIDE RECORDS REVIEWED: Reviewed previous GI notes and last endoscopy in September 2023.       FINAL CLINICAL IMPRESSION(S) / ED DIAGNOSES   Final diagnoses:  None     Rx / DC Orders   ED Discharge Orders     None        Note:  This document was prepared using Dragon voice recognition software and may include unintentional dictation errors.   Giavonni Fonder, Layla Maw, DO 07/12/23 [phone]

## 2023-07-11 NOTE — ED Triage Notes (Signed)
Pt presents to triage via [facility] with complaints of "food impaction" pt states he ate a hot dog 4 days ago and has the sensation that its stuck. Pt is speaking in complete sentences, able to tolerate secretions. Endorses nausea. A&Ox4 at this time. Denies CP or SOB.

## 2023-07-12 MED ORDER — GLUCAGON HCL RDNA (DIAGNOSTIC) 1 MG IJ SOLR
1.0000 mg | Freq: Once | INTRAMUSCULAR | Status: AC
  Administered 2023-07-12: 1 mg via INTRAVENOUS
  Filled 2023-07-12: qty 1

## 2023-07-12 MED ORDER — PANTOPRAZOLE SODIUM 40 MG PO TBEC: 40.0000 mg | DELAYED_RELEASE_TABLET | Freq: Two times a day (BID) | ORAL | 0 refills | Status: DC

## 2023-07-12 MED ORDER — ONDANSETRON 4 MG PO TBDP: 4.0000 mg | ORAL_TABLET | Freq: Four times a day (QID) | ORAL | 0 refills | Status: DC | PRN

## 2023-07-12 MED ORDER — HYDROCODONE-ACETAMINOPHEN 5-325 MG PO TABS
1.0000 | ORAL_TABLET | Freq: Once | ORAL | Status: AC
  Administered 2023-07-12: 1 via ORAL
  Filled 2023-07-12: qty 1

## 2023-07-12 NOTE — ED Notes (Signed)
Patient discharge information was covered in detail with him.  The provider specifically stated that he should avoid steak and alcohol among other items.  The patient's response was "Bullshit, I ain't doing that.  I eat steak at least once a week."  He was rather angry when responding.  The patient was told that these are the instructions, and given his condition he should avoid items such as steak and hotdogs that are difficult to swallow until his condition is resolved.  He was reminded to follow up with the specialist.  He said he can't follow up with the specialist, and he would continue to do what he wants.  Patient was told to fill prescriptions, and talk to his doctor, and escorted from the treatment area.

## 2023-07-12 NOTE — Discharge Instructions (Addendum)
I recommend a soft diet for the next 3 days.  Please avoid steak, hot dogs, alcohol at this time.  Please follow-up with gastroenterology if you continue to have discomfort.  I recommend that you avoid NSAIDs such as ibuprofen, aspirin, Aleve given your history of gastritis on previous endoscopy.  You may take over-the-counter Tylenol 1000 mg every 6 hours as needed for pain.  You may use over-the-counter Mylanta or Maalox as needed for discomfort as well.

## 2023-07-12 NOTE — ED Notes (Signed)
Patient able to tolerate drinking fluids at this time with no discomfort or issues.

## 2023-07-12 NOTE — ED Notes (Signed)
Patient able to tolerate PO medication with no difficulty

## 2023-10-03 ENCOUNTER — Other Ambulatory Visit: Payer: Self-pay

## 2023-10-03 ENCOUNTER — Emergency Department
Admission: EM | Admit: 2023-10-03 | Discharge: 2023-10-03 | Disposition: A | Discharge status: Home / Self Care | Payer: Medicare HMO | Attending: Emergency Medicine | Admitting: Emergency Medicine

## 2023-10-03 ENCOUNTER — Emergency Department: Payer: Medicare HMO

## 2023-10-03 ENCOUNTER — Encounter: Payer: Self-pay | Admitting: Emergency Medicine

## 2023-10-03 DIAGNOSIS — N189 Chronic kidney disease, unspecified: Secondary | ICD-10-CM | POA: Insufficient documentation

## 2023-10-03 DIAGNOSIS — K2980 Duodenitis without bleeding: Secondary | ICD-10-CM | POA: Diagnosis not present

## 2023-10-03 DIAGNOSIS — R109 Unspecified abdominal pain: Secondary | ICD-10-CM | POA: Diagnosis present

## 2023-10-03 LAB — COMPREHENSIVE METABOLIC PANEL
ALT: 10 U/L (ref 0–44)
AST: 16 U/L (ref 15–41)
Albumin: 3.8 g/dL (ref 3.5–5.0)
Alkaline Phosphatase: 60 U/L (ref 38–126)
Anion gap: 10 (ref 5–15)
BUN: 26 mg/dL — ABNORMAL HIGH (ref 8–23)
CO2: 19 mmol/L — ABNORMAL LOW (ref 22–32)
Calcium: 8.8 mg/dL — ABNORMAL LOW (ref 8.9–10.3)
Chloride: 104 mmol/L (ref 98–111)
Creatinine, Ser: 1.66 mg/dL — ABNORMAL HIGH (ref 0.61–1.24)
GFR, Estimated: 45 mL/min — ABNORMAL LOW (ref >60)
Glucose, Bld: 91 mg/dL (ref 70–99)
Potassium: 3.7 mmol/L (ref 3.5–5.1)
Sodium: 133 mmol/L — ABNORMAL LOW (ref 135–145)
Total Bilirubin: 0.3 mg/dL (ref <1.2)
Total Protein: 8.3 g/dL — ABNORMAL HIGH (ref 6.5–8.1)

## 2023-10-03 LAB — CBC
HCT: 30.9 % — ABNORMAL LOW (ref 39.0–52.0)
Hemoglobin: 8.8 g/dL — ABNORMAL LOW (ref 13.0–17.0)
MCH: 23.3 pg — ABNORMAL LOW (ref 26.0–34.0)
MCHC: 28.5 g/dL — ABNORMAL LOW (ref 30.0–36.0)
MCV: 82 fL (ref 80.0–100.0)
Platelets: 169 10*3/uL (ref 150–400)
RBC: 3.77 MIL/uL — ABNORMAL LOW (ref 4.22–5.81)
RDW: 16.2 % — ABNORMAL HIGH (ref 11.5–15.5)
WBC: 8.5 10*3/uL (ref 4.0–10.5)
nRBC: 0 % (ref 0.0–0.2)

## 2023-10-03 LAB — LIPASE, BLOOD: Lipase: 44 U/L (ref 11–51)

## 2023-10-03 LAB — TROPONIN I (HIGH SENSITIVITY): Troponin I (High Sensitivity): 13 ng/L (ref <18)

## 2023-10-03 MED ORDER — LIDOCAINE VISCOUS HCL 2 % MT SOLN
15.0000 mL | Freq: Once | OROMUCOSAL | Status: AC
  Administered 2023-10-03: 15 mL via ORAL
  Filled 2023-10-03: qty 15

## 2023-10-03 MED ORDER — PANTOPRAZOLE SODIUM 40 MG PO TBEC: 40.0000 mg | DELAYED_RELEASE_TABLET | Freq: Every day | ORAL | 1 refills | Status: AC

## 2023-10-03 MED ORDER — ALUM & MAG HYDROXIDE-SIMETH 200-200-20 MG/5ML PO SUSP
30.0000 mL | Freq: Once | ORAL | Status: AC
  Administered 2023-10-03: 30 mL via ORAL
  Filled 2023-10-03: qty 30

## 2023-10-03 NOTE — ED Triage Notes (Signed)
Pt presents to the ED via [facility] with complaints of L side abdominal pain x several hours. Pt states that its worsen when lying flat, causing reflux like sx. Describes the pain as sharp, burning in nature and it wraps around to his back. A&Ox4 at this time. Denies CP or SOB.

## 2023-10-03 NOTE — ED Provider Notes (Signed)
[facility] Provider Note    Event Date/Time   First MD Initiated Contact with Patient 10/03/23 [phone]     (approximate)   History   Abdominal Pain   HPI  John Escobar is a 67 y.o. male with a history of a hiatal hernia, CKD, dilated cardiomyopathy, EtOH, peptic ulcer disease who presents with complaints of abdominal cramping, acid reflux over the last 1-1/2 weeks.  He reports Mylanta has helped however that causes him to have diarrhea so he stopped taking it.  Intermittent cramping pain in the left lower quadrant/flank, no history of kidney stones, no dysuria.  He denies chest pain     Physical Exam   Triage Vital Signs: ED Triage Vitals  Encounter Vitals Group     BP 10/03/23 0355 (!) 170/94     Systolic BP Percentile --      Diastolic BP Percentile --      Pulse Rate 10/03/23 0355 85     Resp 10/03/23 0355 18     Temp 10/03/23 0355 97.7 F (36.5 C)     Temp src --      SpO2 10/03/23 0350 99 %     Weight 10/03/23 0350 72.1 kg (158 lb 15.2 oz)     Height 10/03/23 0350 1.753 m (5\' 9" )     Head Circumference --      Peak Flow --      Pain Score 10/03/23 0350 6     Pain Loc --      Pain Education --      Exclude from Growth Chart --     Most recent vital signs: Vitals:   10/03/23 0355 10/03/23 0921  BP: (!) 170/94 (!) 160/88  Pulse: 85 80  Resp: 18 18  Temp: 97.7 F (36.5 C) 98 F (36.7 C)  SpO2: 100% 100%     General: Awake, no distress.  CV:  Good peripheral perfusion.  Resp:  Normal effort.  Abd:  No distention.  Soft, nontender, reassuring exam Other:     ED Results / Procedures / Treatments   Labs (all labs ordered are listed, but only abnormal results are displayed) Labs Reviewed  COMPREHENSIVE METABOLIC PANEL - Abnormal; Notable for the following components:      Result Value   Sodium 133 (*)    CO2 19 (*)    BUN 26 (*)    Creatinine, Ser 1.66 (*)    Calcium 8.8 (*)    Total Protein 8.3 (*)    GFR, Estimated  45 (*)    All other components within normal limits  CBC - Abnormal; Notable for the following components:   RBC 3.77 (*)    Hemoglobin 8.8 (*)    HCT 30.9 (*)    MCH 23.3 (*)    MCHC 28.5 (*)    RDW 16.2 (*)    All other components within normal limits  LIPASE, BLOOD  URINALYSIS, ROUTINE W REFLEX MICROSCOPIC  TROPONIN I (HIGH SENSITIVITY)     EKG  ED ECG REPORT I, Jene Every, the attending physician, personally viewed and interpreted this ECG.  Date: 10/03/2023  Rhythm: normal sinus rhythm QRS Axis: normal Intervals: normal ST/T Wave abnormalities: Nonspecific changes Narrative Interpretation: no evidence of acute ischemia    RADIOLOGY CT renal stone study viewed interpret by me, no acute abnormalities noted, pending radiology read    PROCEDURES:  Critical Care performed:   Procedures   MEDICATIONS ORDERED IN ED: Medications  alum &  mag hydroxide-simeth (MAALOX/MYLANTA) 200-200-20 MG/5ML suspension 30 mL (30 mLs Oral Given 10/03/23 0738)    And  lidocaine (XYLOCAINE) 2 % viscous mouth solution 15 mL (15 mLs Oral Given 10/03/23 0738)     IMPRESSION / MDM / ASSESSMENT AND PLAN / ED COURSE  I reviewed the triage vital signs and the nursing notes. Patient's presentation is most consistent with acute presentation with potential threat to life or bodily function.  Patient presents with complaints as detailed above, differential includes GERD, peptic ulcer disease, gastritis, ureterolithiasis, diverticulitis, doubt ACS  EKG is reassuring generally well, will add on troponin given epigastric discomfort.  Patient reports his symptoms are improved by burping frequently making gastritis high on the differential, given left lower cramping discomfort intermittently will send for CT renal stone study, will treat with GI cocktail reevaluate.  CT scan was consistent with duodenitis which is fitting with his symptoms, will start the patient on PPI, outpatient follow-up  with GI recommended      FINAL CLINICAL IMPRESSION(S) / ED DIAGNOSES   Final diagnoses:  Duodenitis     Rx / DC Orders   ED Discharge Orders          Ordered    pantoprazole (PROTONIX) 40 MG tablet  Daily        10/03/23 0913             Note:  This document was prepared using Dragon voice recognition software and may include unintentional dictation errors.   Jene Every, MD 10/03/23 1236

## 2024-03-26 ENCOUNTER — Encounter: Payer: Self-pay | Admitting: Emergency Medicine

## 2024-03-26 ENCOUNTER — Emergency Department

## 2024-03-26 ENCOUNTER — Other Ambulatory Visit: Payer: Self-pay

## 2024-03-26 ENCOUNTER — Emergency Department
Admission: EM | Admit: 2024-03-26 | Discharge: 2024-03-26 | Disposition: A | Discharge status: Home / Self Care | Attending: Emergency Medicine | Admitting: Emergency Medicine

## 2024-03-26 DIAGNOSIS — D649 Anemia, unspecified: Secondary | ICD-10-CM | POA: Insufficient documentation

## 2024-03-26 DIAGNOSIS — Y909 Presence of alcohol in blood, level not specified: Secondary | ICD-10-CM | POA: Diagnosis not present

## 2024-03-26 DIAGNOSIS — R195 Other fecal abnormalities: Secondary | ICD-10-CM | POA: Diagnosis not present

## 2024-03-26 DIAGNOSIS — I509 Heart failure, unspecified: Secondary | ICD-10-CM | POA: Diagnosis not present

## 2024-03-26 DIAGNOSIS — F1010 Alcohol abuse, uncomplicated: Secondary | ICD-10-CM | POA: Diagnosis not present

## 2024-03-26 DIAGNOSIS — L03115 Cellulitis of right lower limb: Secondary | ICD-10-CM | POA: Insufficient documentation

## 2024-03-26 DIAGNOSIS — M79671 Pain in right foot: Secondary | ICD-10-CM | POA: Diagnosis present

## 2024-03-26 LAB — BASIC METABOLIC PANEL WITH GFR
Anion gap: 12 (ref 5–15)
Anion gap: 8 (ref 5–15)
BUN: 26 mg/dL — ABNORMAL HIGH (ref 8–23)
BUN: 25 mg/dL — ABNORMAL HIGH (ref 8–23)
CO2: 12 mmol/L — ABNORMAL LOW (ref 22–32)
CO2: 15 mmol/L — ABNORMAL LOW (ref 22–32)
Calcium: 8.5 mg/dL — ABNORMAL LOW (ref 8.9–10.3)
Calcium: 8.3 mg/dL — ABNORMAL LOW (ref 8.9–10.3)
Chloride: 108 mmol/L (ref 98–111)
Chloride: 110 mmol/L (ref 98–111)
Creatinine, Ser: 1.94 mg/dL — ABNORMAL HIGH (ref 0.61–1.24)
Creatinine, Ser: 1.62 mg/dL — ABNORMAL HIGH (ref 0.61–1.24)
GFR, Estimated: 37 mL/min — ABNORMAL LOW (ref >60)
GFR, Estimated: 46 mL/min — ABNORMAL LOW (ref >60)
Glucose, Bld: 80 mg/dL (ref 70–99)
Glucose, Bld: 199 mg/dL — ABNORMAL HIGH (ref 70–99)
Potassium: 4.1 mmol/L (ref 3.5–5.1)
Potassium: 4.1 mmol/L (ref 3.5–5.1)
Sodium: 132 mmol/L — ABNORMAL LOW (ref 135–145)
Sodium: 133 mmol/L — ABNORMAL LOW (ref 135–145)

## 2024-03-26 LAB — CBC
HCT: 23.3 % — ABNORMAL LOW (ref 39.0–52.0)
Hemoglobin: 6.4 g/dL — ABNORMAL LOW (ref 13.0–17.0)
MCH: 25.6 pg — ABNORMAL LOW (ref 26.0–34.0)
MCHC: 27.5 g/dL — ABNORMAL LOW (ref 30.0–36.0)
MCV: 93.2 fL (ref 80.0–100.0)
Platelets: 150 10*3/uL (ref 150–400)
RBC: 2.5 MIL/uL — ABNORMAL LOW (ref 4.22–5.81)
RDW: 20.1 % — ABNORMAL HIGH (ref 11.5–15.5)
WBC: 7.7 10*3/uL (ref 4.0–10.5)
nRBC: 0 % (ref 0.0–0.2)

## 2024-03-26 LAB — PREPARE RBC (CROSSMATCH)

## 2024-03-26 MED ORDER — DOXYCYCLINE HYCLATE 100 MG PO TABS
100.0000 mg | ORAL_TABLET | Freq: Once | ORAL | Status: AC
  Administered 2024-03-26: 100 mg via ORAL
  Filled 2024-03-26: qty 1

## 2024-03-26 MED ORDER — DEXTROSE 5 % IN LACTATED RINGERS IV BOLUS
1000.0000 mL | Freq: Once | INTRAVENOUS | Status: AC
  Administered 2024-03-26: 1000 mL via INTRAVENOUS

## 2024-03-26 MED ORDER — CEPHALEXIN 500 MG PO CAPS: 500.0000 mg | ORAL_CAPSULE | Freq: Four times a day (QID) | ORAL | 0 refills | Status: AC

## 2024-03-26 MED ORDER — DOXYCYCLINE HYCLATE 100 MG PO TABS: 100.0000 mg | ORAL_TABLET | Freq: Two times a day (BID) | ORAL | 0 refills | Status: AC

## 2024-03-26 MED ORDER — CEPHALEXIN 500 MG PO CAPS
500.0000 mg | ORAL_CAPSULE | Freq: Once | ORAL | Status: AC
  Administered 2024-03-26: 500 mg via ORAL
  Filled 2024-03-26: qty 1

## 2024-03-26 NOTE — ED Notes (Signed)
 Lt green, purple, and gray lab tubes sent to lab at this time.

## 2024-03-26 NOTE — ED Triage Notes (Signed)
 Pt via [facility] from home. Pt reports redness and pain to the R foot for the past years. States that it is worse today. Reports that it started as a burning sensation. Pt is A&Ox4 and NAD EMS reports:  118 CBG  97.9 oral  136/75 79 HR  99% on RA

## 2024-03-26 NOTE — ED Notes (Signed)
 The pt advised he did not want a sock placed on his foot. The pt advised he wanted to walk to the restroom. The pt was provided a wheel chair and wheeled to the restroom. The pt when finished was then wheeled back his room without incident.

## 2024-03-26 NOTE — Discharge Instructions (Addendum)
 Please make sure to follow-up with podiatry for your foot pain as well as redness.  Please also make sure to follow-up with the gastroenterologist given that your blood counts were low today and your stool was positive for blood even though it was brown.  Please make sure to take the antibiotics as prescribed for your cellulitis.  Please make sure to keep yourself hydrated.

## 2024-03-26 NOTE — ED Notes (Signed)
Pt provided 3 blankets.

## 2024-03-26 NOTE — ED Provider Notes (Signed)
  Physical Exam  BP (!) 149/92   Pulse 73   Temp 97.9 F (36.6 C) (Oral)   Resp 16   Ht 5' 9 (1.753 m)   Wt 65.8 kg   SpO2 100%   BMI 21.41 kg/m   Physical Exam  Procedures  Procedures  ED Course / MDM   Clinical Course as of 03/26/24 2214  Sat Mar 26, 2024  1447 DG Foot Complete Right IMPRESSION: 1. Findings consistent with tophaceous gout gout. Punched out lesions with sclerotic margins are seen in the first digit and fifth digit, with overlying calcified gouty tophi. 2. 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 3. No acute displaced fracture.   [TT]  1513 Independent review of labs, he is anemic, no leukocytosis, electrolytes are not severely deranged, he is acidotic but no anion gap, glucose is 80, given that he has history of failure to thrive in chronic alcohol use, considered starvation ketoacidosis, will give him a bolus of D5 LR.  Will also give him a unit of packed RBCs. [TT]  1515 Creatinine(!): 1.94 Mild AKI, compared Cr to prior [TT]    Clinical Course User Index [TT] Drenda Gentle Richard Champion, MD   Medical Decision Making Amount and/or Complexity of Data Reviewed Labs: ordered. Decision-making details documented in ED Course. Radiology: ordered. Decision-making details documented in ED Course.  Risk Prescription drug management.   Assuming care of the patient at shift change from Dr. Drenda Gentle.  Insert short see H&P physical exam on original note by provider  Plan at this time is to reassess his BMP and if it has improved after fluids discharged to home.  If not admit to the hospital.  Labs are reassuring.  I did discuss with Dr. Azalee Bolds, he agrees most is chronic and patient can be discharged home.  Strict instructions to return emergency department.  Follow-up podiatry.  Patient is in agreement treatment plan.  Discharged stable condition.     Delsie Figures, PA-C 03/26/24 2214    Shane Darling, MD 03/27/24 1058

## 2024-03-26 NOTE — ED Provider Notes (Signed)
 John Escobar Provider Note    Event Date/Time   First MD Initiated Contact with Patient 03/26/24 1351     (approximate)   History   Foot Pain   HPI  John Escobar is a 68 y.o. male with history of peptic ulcer disease, alcohol abuse, heart failure, iron  deficiency anemia, presenting with right foot redness.  Patient states has been ongoing for a year, states that he does not see any doctors for it, no recent antibiotic use.  Denies any fever, denies any new trauma or falls.  No new numbness or weakness.  States that the redness has been spreading over the course of weeks. Able to range his ankle.  States that he drinks alcohol daily, denies history of withdrawals.  Also denies any melena or hematochezia, no hematuria or hematemesis.  Patient states that he had history of epistaxis but no episodes recently.  No chest pain or shortness of breath.  No abdominal pain or back pain or urinary symptoms.  On independent chart review, he was admitted in 2023 for GI bleed secondary to peptic ulcer disease and gastrointestinal AVMs.  He presented at that time with symptomatic anemia due to coffee-ground emesis.     Physical Exam   Triage Vital Signs: ED Triage Vitals  Encounter Vitals Group     BP 03/26/24 1328 (!) 130/113     Girls Systolic BP Percentile --      Girls Diastolic BP Percentile --      Boys Systolic BP Percentile --      Boys Diastolic BP Percentile --      Pulse Rate 03/26/24 1328 80     Resp 03/26/24 1328 18     Temp 03/26/24 1328 98.2 F (36.8 C)     Temp Source 03/26/24 1328 Oral     SpO2 03/26/24 1328 100 %     Weight 03/26/24 1321 145 lb (65.8 kg)     Height 03/26/24 1321 5' 9 (1.753 m)     Head Circumference --      Peak Flow --      Pain Score 03/26/24 1321 10     Pain Loc --      Pain Education --      Exclude from Growth Chart --     Most recent vital signs: Vitals:   03/26/24 1829 03/26/24 1845  BP: (!) 164/94 (!) 143/89   Pulse: 90   Resp: 18 18  Temp: (!) 97.5 F (36.4 C) 97.8 F (36.6 C)  SpO2: 98% 97%     General: Awake, no distress.  CV:  Good peripheral perfusion.  Resp:  Normal effort.  Abd:  No distention.  Soft nontender Other:  Pale, equal DP pulses bilaterally, dressing to his right foot with an ointment, dorsal foot up to anterior ankle with superficial wound, there is some erythema and warmth, no fluctuance, sensation is intact, strength is intact, no pain out of proportion, no crepitus.  No deep wounds.    ED Results / Procedures / Treatments   Labs (all labs ordered are listed, but only abnormal results are displayed) Labs Reviewed  CBC - Abnormal; Notable for the following components:      Result Value   RBC 2.50 (*)    Hemoglobin 6.4 (*)    HCT 23.3 (*)    MCH 25.6 (*)    MCHC 27.5 (*)    RDW 20.1 (*)    All other components within normal limits  BASIC METABOLIC PANEL WITH GFR - Abnormal; Notable for the following components:   Sodium 132 (*)    CO2 12 (*)    BUN 26 (*)    Creatinine, Ser 1.94 (*)    Calcium 8.5 (*)    GFR, Estimated 37 (*)    All other components within normal limits  BASIC METABOLIC PANEL WITH GFR  TYPE AND SCREEN  PREPARE RBC (CROSSMATCH)   Radiology: X-ray on my independent interpretation without obvious fractures  PROCEDURES:  Critical Care performed: Yes, see critical care procedure note(s)  .Critical Care  Performed by: Shane Darling, MD Authorized by: Shane Darling, MD   Critical care provider statement:    Critical care time (minutes):  35   Critical care was necessary to treat or prevent imminent or life-threatening deterioration of the following conditions:  Circulatory failure (anemia requiring blood transfusion)   Critical care was time spent personally by me on the following activities:  Development of treatment plan with patient or surrogate, discussions with consultants, evaluation of patient's response to treatment, examination  of patient, ordering and review of laboratory studies, ordering and review of radiographic studies, ordering and performing treatments and interventions, pulse oximetry, re-evaluation of patient's condition and review of old charts    MEDICATIONS ORDERED IN ED: Medications  doxycycline (VIBRA-TABS) tablet 100 mg (100 mg Oral Given 03/26/24 1448)  cephALEXin (KEFLEX) capsule 500 mg (500 mg Oral Given 03/26/24 1448)  dextrose  5% lactated ringers  bolus 1,000 mL (0 mLs Intravenous Paused 03/26/24 1828)     IMPRESSION / MDM / ASSESSMENT AND PLAN / ED COURSE  I reviewed the triage vital signs and the nursing notes.                              Differential diagnosis includes, but is not limited to, cellulitis, slow healing superficial wound, no evidence of deeper space infection, denies any trauma or or falls recently to suggest fracture.  Labs, x-ray, PO abx.   Patient's presentation is most consistent with acute presentation with potential threat to life or bodily function.  Independent interpretation of labs and imaging below.  Patient is found to be anemic to 6.4, discussed with him about blood transfusion and he is agreeable with the plan to proceed, rectal exam was done with a chaperone, brown stool in rectal vault, it is Hemoccult positive.  He is not on any anticoagulation.  No abdominal pain at this time.  Will give him 1 unit of packed RBCs.  Patient signed out pending repeat BMP after D5 LR.  If persistently acidotic, he will need to be admitted for further management.  Otherwise can be discharged with outpatient management, put into instructions for podiatry as well as GI follow-up.  Also gave him p.o. prescriptions for the cellulitis.  Shared decision making to patient and he is agreeable with this plan.    Clinical Course as of 03/26/24 1910  Sat Mar 26, 2024  1447 DG Foot Complete Right IMPRESSION: 1. Findings consistent with tophaceous gout gout. Punched out lesions with  sclerotic margins are seen in the first digit and fifth digit, with overlying calcified gouty tophi. 2. 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 3. No acute displaced fracture.   [TT]  1513 Independent review of labs, he is anemic, no leukocytosis, electrolytes are not severely deranged, he is acidotic but no anion gap, glucose is 80, given that he has history of  failure to thrive in chronic alcohol use, considered starvation ketoacidosis, will give him a bolus of D5 LR.  Will also give him a unit of packed RBCs. [TT]  1515 Creatinine(!): 1.94 Mild AKI, compared Cr to prior [TT]    Clinical Course User Index [TT] Drenda Gentle Richard Champion, MD     FINAL CLINICAL IMPRESSION(S) / ED DIAGNOSES   Final diagnoses:  Foot pain, right  Cellulitis of right lower extremity  Anemia, unspecified type  Heme positive stool     Rx / DC Orders   ED Discharge Orders          Ordered    cephALEXin (KEFLEX) 500 MG capsule  4 times daily        03/26/24 1739    doxycycline (VIBRA-TABS) 100 MG tablet  2 times daily        03/26/24 1739    Ambulatory Referral to Primary Care (Establish Care)        03/26/24 1739             Note:  This document was prepared using Dragon voice recognition software and may include unintentional dictation errors.    Shane Darling, MD 03/26/24 1910

## 2024-03-27 LAB — BPAM RBC
Blood Product Expiration Date: 202506252359
ISSUE DATE / TIME: 202506141822
Unit Type and Rh: 6200

## 2024-03-27 LAB — TYPE AND SCREEN
ABO/RH(D): A POS
Antibody Screen: NEGATIVE
Unit division: 0
